# Patient Record
Sex: Female | Born: 1980 | Race: White | Hispanic: No | Marital: Single | Smoking: Never smoker
Health system: Southern US, Community
[De-identification: ages and names within clinical notes are randomized; demographics above are authoritative.]

## PROBLEM LIST (undated history)

## (undated) DIAGNOSIS — J45909 Unspecified asthma, uncomplicated: Secondary | ICD-10-CM

## (undated) DIAGNOSIS — L509 Urticaria, unspecified: Secondary | ICD-10-CM

## (undated) DIAGNOSIS — L0390 Cellulitis, unspecified: Secondary | ICD-10-CM

## (undated) HISTORY — DX: Urticaria, unspecified: L50.9

## (undated) HISTORY — PX: EYE SURGERY: SHX253

## (undated) HISTORY — PX: TUBAL LIGATION: SHX77

---

## 2010-10-16 ENCOUNTER — Emergency Department (HOSPITAL_COMMUNITY)
Admission: EM | Admit: 2010-10-16 | Discharge: 2010-10-17 | Payer: Self-pay | Source: Home / Self Care | Admitting: Emergency Medicine

## 2011-01-14 LAB — URINALYSIS, ROUTINE W REFLEX MICROSCOPIC
Bilirubin Urine: NEGATIVE
Glucose, UA: NEGATIVE mg/dL
Hgb urine dipstick: NEGATIVE
Nitrite: NEGATIVE
Protein, ur: NEGATIVE mg/dL
Specific Gravity, Urine: 1.018 (ref 1.005–1.030)
Urobilinogen, UA: 0.2 mg/dL (ref 0.0–1.0)
pH: 6 (ref 5.0–8.0)

## 2011-01-14 LAB — POCT I-STAT, CHEM 8
BUN: 15 mg/dL (ref 6–23)
HCT: 38 % (ref 36.0–46.0)
Potassium: 3.7 mEq/L (ref 3.5–5.1)
Sodium: 140 mEq/L (ref 135–145)
TCO2: 24 mmol/L (ref 0–100)

## 2011-01-14 LAB — RAPID URINE DRUG SCREEN, HOSP PERFORMED
Opiates: NOT DETECTED
Tetrahydrocannabinol: NOT DETECTED

## 2011-01-14 LAB — URINE MICROSCOPIC-ADD ON

## 2011-02-04 ENCOUNTER — Emergency Department (HOSPITAL_COMMUNITY)
Admission: EM | Admit: 2011-02-04 | Discharge: 2011-02-04 | Disposition: A | Discharge status: Home / Self Care | Payer: Self-pay | Attending: Emergency Medicine | Admitting: Emergency Medicine

## 2011-02-04 DIAGNOSIS — L0231 Cutaneous abscess of buttock: Secondary | ICD-10-CM | POA: Insufficient documentation

## 2011-02-04 DIAGNOSIS — L03317 Cellulitis of buttock: Secondary | ICD-10-CM | POA: Insufficient documentation

## 2011-03-22 ENCOUNTER — Emergency Department (HOSPITAL_COMMUNITY)
Admission: EM | Admit: 2011-03-22 | Discharge: 2011-03-22 | Disposition: A | Discharge status: Home / Self Care | Payer: Self-pay | Attending: Emergency Medicine | Admitting: Emergency Medicine

## 2011-03-22 DIAGNOSIS — L03317 Cellulitis of buttock: Secondary | ICD-10-CM | POA: Insufficient documentation

## 2011-03-22 DIAGNOSIS — L0231 Cutaneous abscess of buttock: Secondary | ICD-10-CM | POA: Insufficient documentation

## 2011-03-24 ENCOUNTER — Emergency Department (HOSPITAL_COMMUNITY)
Admission: EM | Admit: 2011-03-24 | Discharge: 2011-03-25 | Disposition: A | Discharge status: Home / Self Care | Payer: Self-pay | Attending: Emergency Medicine | Admitting: Emergency Medicine

## 2011-03-24 DIAGNOSIS — L0231 Cutaneous abscess of buttock: Secondary | ICD-10-CM | POA: Insufficient documentation

## 2011-03-24 DIAGNOSIS — Z09 Encounter for follow-up examination after completed treatment for conditions other than malignant neoplasm: Secondary | ICD-10-CM | POA: Insufficient documentation

## 2011-05-15 ENCOUNTER — Emergency Department (HOSPITAL_COMMUNITY)
Admission: EM | Admit: 2011-05-15 | Discharge: 2011-05-15 | Disposition: A | Discharge status: Home / Self Care | Payer: Self-pay | Attending: Emergency Medicine | Admitting: Emergency Medicine

## 2011-05-15 DIAGNOSIS — F319 Bipolar disorder, unspecified: Secondary | ICD-10-CM | POA: Insufficient documentation

## 2011-05-15 DIAGNOSIS — L03119 Cellulitis of unspecified part of limb: Secondary | ICD-10-CM | POA: Insufficient documentation

## 2011-05-15 DIAGNOSIS — L02419 Cutaneous abscess of limb, unspecified: Secondary | ICD-10-CM | POA: Insufficient documentation

## 2011-09-07 ENCOUNTER — Emergency Department (HOSPITAL_COMMUNITY)
Admission: EM | Admit: 2011-09-07 | Discharge: 2011-09-08 | Disposition: A | Discharge status: Home / Self Care | Payer: Self-pay | Attending: Emergency Medicine | Admitting: Emergency Medicine

## 2011-09-07 ENCOUNTER — Encounter: Payer: Self-pay | Admitting: *Deleted

## 2011-09-07 ENCOUNTER — Emergency Department (HOSPITAL_COMMUNITY)
Admission: EM | Admit: 2011-09-07 | Discharge: 2011-09-07 | Discharge status: Against Medical Advice | Payer: Self-pay | Attending: Emergency Medicine | Admitting: Emergency Medicine

## 2011-09-07 DIAGNOSIS — L03119 Cellulitis of unspecified part of limb: Secondary | ICD-10-CM | POA: Insufficient documentation

## 2011-09-07 DIAGNOSIS — L0291 Cutaneous abscess, unspecified: Secondary | ICD-10-CM

## 2011-09-07 DIAGNOSIS — L02416 Cutaneous abscess of left lower limb: Secondary | ICD-10-CM

## 2011-09-07 DIAGNOSIS — L02419 Cutaneous abscess of limb, unspecified: Secondary | ICD-10-CM | POA: Insufficient documentation

## 2011-09-07 DIAGNOSIS — Z0389 Encounter for observation for other suspected diseases and conditions ruled out: Secondary | ICD-10-CM | POA: Insufficient documentation

## 2011-09-07 HISTORY — DX: Cellulitis, unspecified: L03.90

## 2011-09-07 NOTE — ED Notes (Signed)
Resting with no pain or discomfort at this time , respirations unlabored, no drainage at abscess / slight peripheral reddness.

## 2011-09-07 NOTE — ED Notes (Signed)
abcess to inner L calf x 1 week. Reddened area with scab in center. Friend tried to open and drain it for the pt but now there is no drainage noted

## 2011-09-07 NOTE — ED Notes (Signed)
Here for L medial calf abcess, has been seen at [facility] for the same, abx not working. 1st noticed this abcess 4-7d ago, h/o same. ~4x4cm circular patch of redness with scab center. Repots some drainage after squeezing. Pt has pictures. Unsure of fever, but mentions chills.  Denies vd or cramping.

## 2011-09-07 NOTE — ED Provider Notes (Signed)
Patient left AMA prior to evaluation by the physician  Dayton Bailiff, MD 09/07/11 1601

## 2011-09-08 MED ORDER — HYDROCODONE-ACETAMINOPHEN 5-325 MG PO TABS: 1.0000 | ORAL_TABLET | Freq: Once | ORAL | Status: DC

## 2011-09-08 MED ORDER — DOXYCYCLINE HYCLATE 100 MG PO CAPS: 100.0000 mg | ORAL_CAPSULE | Freq: Two times a day (BID) | ORAL | Status: AC

## 2011-09-08 MED ORDER — DOXYCYCLINE HYCLATE 100 MG PO CAPS: 100.0000 mg | ORAL_CAPSULE | Freq: Two times a day (BID) | ORAL | Status: DC

## 2011-09-08 MED ORDER — HYDROCODONE-ACETAMINOPHEN 5-325 MG PO TABS
2.0000 | ORAL_TABLET | Freq: Once | ORAL | Status: AC
  Administered 2011-09-08: 2 via ORAL
  Filled 2011-09-08 (×2): qty 1

## 2011-09-08 NOTE — ED Provider Notes (Signed)
Medical screening examination/treatment/procedure(s) were performed by non-physician practitioner and as supervising physician I was immediately available for consultation/collaboration.  Olivia Mackie, MD 09/08/11 [phone]

## 2011-09-08 NOTE — ED Provider Notes (Signed)
History     CSN: [id number] Arrival date & time: 09/07/2011  8:51 PM   None     Chief Complaint  Patient presents with  . Abscess    (Consider location/radiation/quality/duration/timing/severity/associated sxs/prior treatment) HPI Comments: Patient presents with several day history of left lower leg abscess - no drainage - mild erythema surrounding the area.   Patient is a 30 y.o. female presenting with abscess. The history is provided by the patient. No language interpreter was used.  Abscess  This is a recurrent problem. The current episode started yesterday. The onset was gradual. The problem occurs frequently. The problem has been unchanged. The abscess is present on the left lower leg. The problem is moderate. The abscess is characterized by redness and painfulness. The patient was exposed to OTC medications. The abscess first occurred at home. Pertinent negatives include no anorexia, not sleeping less, no fever, no diarrhea, no vomiting, no sore throat and no cough. Her past medical history is significant for skin abscesses in family. She has received no recent medical care.    Past Medical History  Diagnosis Date  . Cellulitis     cellulitis/abcess x15, "every 2 months"    Past Surgical History  Procedure Date  . Eye surgery   . Cesarean section   . Tubal ligation     History reviewed. No pertinent family history.  History  Substance Use Topics  . Smoking status: Never Smoker   . Smokeless tobacco: Not on file  . Alcohol Use: No    OB History    Grav Para Term Preterm Abortions TAB SAB Ect Mult Living                  Review of Systems  Constitutional: Negative for fever and chills.  HENT: Negative.  Negative for sore throat.   Eyes: Negative.   Respiratory: Negative.  Negative for cough.   Cardiovascular: Negative.   Gastrointestinal: Negative.  Negative for vomiting, diarrhea and anorexia.  Genitourinary: Negative.   Musculoskeletal: Negative.     Neurological: Negative.   Hematological: Negative.   Psychiatric/Behavioral: Negative.     Allergies  Penicillins and Sulfa antibiotics  Home Medications  No current outpatient prescriptions on file.  BP 135/68  Pulse 100  Temp(Src) 98.7 F (37.1 C) (Oral)  Resp 18  SpO2 100%  LMP 08/29/2011  Physical Exam  Constitutional: She is oriented to person, place, and time. She appears well-developed and well-nourished.  HENT:  Head: Normocephalic and atraumatic.  Eyes: Pupils are equal, round, and reactive to light.  Neck: Normal range of motion. Neck supple.  Cardiovascular: Normal rate and regular rhythm.   Pulmonary/Chest: Effort normal and breath sounds normal.  Abdominal: Soft.  Musculoskeletal: Normal range of motion.  Neurological: She is alert and oriented to person, place, and time.  Skin: Skin is warm and dry.     Psychiatric: She has a normal mood and affect. Her behavior is normal. Judgment and thought content normal.    ED Course  INCISION AND DRAINAGE Date/Time: 09/08/2011 1:32 AM Performed by: Marisue Humble, Alexiah Koroma C. Authorized by: Patrecia Pour Consent: Verbal consent obtained. Written consent not obtained. Risks and benefits: risks, benefits and alternatives were discussed Consent given by: patient Patient understanding: patient states understanding of the procedure being performed Patient consent: the patient's understanding of the procedure matches consent given Relevant documents: relevant documents present and verified Test results: test results not available Site marked: the operative site was marked Patient identity confirmed: verbally  with patient and arm band Time out: Immediately prior to procedure a "time out" was called to verify the correct patient, procedure, equipment, support staff and site/side marked as required. Type: abscess Body area: lower extremity Location details: left leg Anesthesia: local infiltration Local anesthetic:  lidocaine 2% without epinephrine Anesthetic total: 3 ml Patient sedated: no Scalpel size: 11 Incision type: elliptical Complexity: complex Drainage: purulent Drainage amount: moderate Wound treatment: wound left open Packing material: 1/4 in iodoform gauze Patient tolerance: Patient tolerated the procedure well with no immediate complications.   (including critical care time)  Labs Reviewed - No data to display No results found.   Abscess of left lower leg   MDM  I&D of complex abscess - will place on doxycycline and pain control and the patient will return in 2 days for reassessment.        Izola Price [place], [place] 09/08/11 [phone]

## 2012-02-27 ENCOUNTER — Emergency Department (HOSPITAL_COMMUNITY)
Admission: EM | Admit: 2012-02-27 | Discharge: 2012-02-28 | Disposition: A | Discharge status: Home / Self Care | Payer: Self-pay | Attending: Emergency Medicine | Admitting: Emergency Medicine

## 2012-02-27 ENCOUNTER — Encounter (HOSPITAL_COMMUNITY): Payer: Self-pay | Admitting: *Deleted

## 2012-02-27 DIAGNOSIS — M549 Dorsalgia, unspecified: Secondary | ICD-10-CM | POA: Insufficient documentation

## 2012-02-27 DIAGNOSIS — R350 Frequency of micturition: Secondary | ICD-10-CM | POA: Insufficient documentation

## 2012-02-27 DIAGNOSIS — R300 Dysuria: Secondary | ICD-10-CM | POA: Insufficient documentation

## 2012-02-27 DIAGNOSIS — R10819 Abdominal tenderness, unspecified site: Secondary | ICD-10-CM | POA: Insufficient documentation

## 2012-02-27 DIAGNOSIS — R109 Unspecified abdominal pain: Secondary | ICD-10-CM | POA: Insufficient documentation

## 2012-02-27 DIAGNOSIS — N3090 Cystitis, unspecified without hematuria: Secondary | ICD-10-CM | POA: Insufficient documentation

## 2012-02-27 LAB — URINALYSIS, ROUTINE W REFLEX MICROSCOPIC
Bilirubin Urine: NEGATIVE
Glucose, UA: NEGATIVE mg/dL
Ketones, ur: NEGATIVE mg/dL
Nitrite: NEGATIVE
Protein, ur: 100 mg/dL — AB
Specific Gravity, Urine: 1.009 (ref 1.005–1.030)
Urobilinogen, UA: 0.2 mg/dL (ref 0.0–1.0)
pH: 6 (ref 5.0–8.0)

## 2012-02-27 LAB — URINE MICROSCOPIC-ADD ON

## 2012-02-27 LAB — PREGNANCY, URINE: Preg Test, Ur: NEGATIVE

## 2012-02-27 NOTE — ED Notes (Signed)
The pt says she has a uti and she has had lower back pain for one wee.  lmp march 1st

## 2012-02-28 MED ORDER — CIPROFLOXACIN HCL 500 MG PO TABS: 500.0000 mg | ORAL_TABLET | Freq: Two times a day (BID) | ORAL | Status: AC

## 2012-02-28 NOTE — ED Notes (Signed)
Rx x 1, pt voiced understanding to f/u with PCP and finish abx rx

## 2012-02-28 NOTE — ED Provider Notes (Signed)
Medical screening examination/treatment/procedure(s) were performed by non-physician practitioner and as supervising physician I was immediately available for consultation/collaboration.  Daje Stark K Xachary Hambly-Rasch, MD 02/28/12 0640 

## 2012-02-28 NOTE — Discharge Instructions (Signed)
Your urine shows signs for a urinary tract infection. At this time your providers have provided a prescription for antibiotics to treat your infection. Please take this for the full 10 days and followup with a primary care provider. Return to the emergency room for any worsening symptoms, fever, chills, persistent nausea vomiting.   Urinary Tract Infection Infections of the urinary tract can start in several places. A bladder infection (cystitis), a kidney infection (pyelonephritis), and a prostate infection (prostatitis) are different types of urinary tract infections (UTIs). They usually get better if treated with medicines (antibiotics) that kill germs. Take all the medicine until it is gone. You or your child may feel better in a few days, but TAKE ALL MEDICINE or the infection may not respond and may become more difficult to treat. HOME CARE INSTRUCTIONS   Drink enough water and fluids to keep the urine clear or pale yellow. Cranberry juice is especially recommended, in addition to large amounts of water.   Avoid caffeine, tea, and carbonated beverages. They tend to irritate the bladder.   Alcohol may irritate the prostate.   Only take over-the-counter or prescription medicines for pain, discomfort, or fever as directed by your caregiver.  To prevent further infections:  Empty the bladder often. Avoid holding urine for long periods of time.   After a bowel movement, women should cleanse from front to back. Use each tissue only once.   Empty the bladder before and after sexual intercourse.  FINDING OUT THE RESULTS OF YOUR TEST Not all test results are available during your visit. If your or your child's test results are not back during the visit, make an appointment with your caregiver to find out the results. Do not assume everything is normal if you have not heard from your caregiver or the medical facility. It is important for you to follow up on all test results. SEEK MEDICAL CARE IF:     There is back pain.   Your baby is older than 3 months with a rectal temperature of 100.5 F (38.1 C) or higher for more than 1 day.   Your or your child's problems (symptoms) are no better in 3 days. Return sooner if you or your child is getting worse.  SEEK IMMEDIATE MEDICAL CARE IF:   There is severe back pain or lower abdominal pain.   You or your child develops chills.   You have a fever.   Your baby is older than 3 months with a rectal temperature of 102 F (38.9 C) or higher.   Your baby is 33 months old or younger with a rectal temperature of 100.4 F (38 C) or higher.   There is nausea or vomiting.   There is continued burning or discomfort with urination.  MAKE SURE YOU:   Understand these instructions.   Will watch your condition.   Will get help right away if you are not doing well or get worse.  Document Released: 07/30/2005 Document Revised: 10/09/2011 Document Reviewed: 03/04/2007 [facility] Patient Information 2012 [place], [place].  RESOURCE GUIDE  Dental Problems  Patients with Medicaid: [facility] [phone] [address]  [address] Phone:  [phone]                                                  Phone:  [phone]  If unable to pay or uninsured, contact:  [facility] or [facility]. to become qualified for the adult dental clinic.  Chronic Pain Problems Contact [facility]  [phone] Patients need to be referred by their primary care doctor.  Insufficient Money for Medicine Contact United Way:  call "211" or [facility] [phone])[phone].  No Primary Care Doctor Call [facility]  [phone] Other agencies that provide inexpensive medical care    [facility]  [phone]    [facility]  [phone]    [facility]  [phone]    [facility]  [phone]    Planned  Parenthood  [phone]    [facility]  [phone]  Psychological Services [facility] Behavioral Health  [phone] [facility] Services  [phone] [facility] Mental Health   [phone] (emergency services [phone])  Substance Abuse Resources [facility]  [phone] [facility] [phone] [facility] [phone] Floyd[facility] Flock [phone] Residential & Outpatient Substance Abuse Program  [phone]  Abuse/Neglect [facility] Child Abuse Hotline [phone] [facility] Child Abuse Hotline [phone] (After Hours)  Emergency Shelter [facility] [phone]  Maternity Homes [facility] [place] [facility] [phone] [facility] [phone]  MRSA Hotline #:   (640)754-1332    [facility] Resources  [facility]     [employer]                          [facility] Dept. [address] [place]                       [address]      [address]  Blondell Reveal Phone:  [phone]                                   Phone:  [phone]                 Phone:  [phone]  [facility] Mental Health Phone:  [phone]  [facility] Child Abuse Hotline [phone] [phone] (After Hours)

## 2012-02-28 NOTE — ED Provider Notes (Signed)
History     CSN: [id number]  Arrival date & time 02/27/12  2136   First MD Initiated Contact with Patient 02/27/12 2359      Chief Complaint  Patient presents with  . Back Pain   HPI  History provided by the patient. Patient is a 31 year old female with history of cesarean section presents with complaints of urinary tract infection. Patient reports having symptoms of dysuria, urinary frequency began last week. Patient reports history of UTIs until symptoms are similar. She was taking AZO from the pharmacy hopeing to improve symptoms without change. This morning patient reports that she woke up with bilateral low back pain and aches. Patient is concerned for worsening infection. She denies any fever, chills, sweats, nausea or vomiting. She denies any hematuria. She denies any vaginal bleeding or vaginal discharge.    Past Medical History  Diagnosis Date  . Cellulitis     cellulitis/abcess x15, "every 2 months"    Past Surgical History  Procedure Date  . Eye surgery   . Cesarean section   . Tubal ligation     No family history on file.  History  Substance Use Topics  . Smoking status: Never Smoker   . Smokeless tobacco: Not on file  . Alcohol Use: No    OB History    Grav Para Term Preterm Abortions TAB SAB Ect Mult Living                  Review of Systems  Constitutional: Negative for fever and chills.  Gastrointestinal: Negative for nausea, vomiting, abdominal pain, diarrhea and constipation.  Genitourinary: Positive for dysuria, frequency and flank pain. Negative for hematuria, vaginal bleeding and vaginal discharge.  Musculoskeletal: Positive for back pain.    Allergies  Penicillins and Sulfa antibiotics  Home Medications  No current outpatient prescriptions on file.  BP 128/87  Pulse 100  Temp(Src) 98.7 F (37.1 C) (Oral)  Resp 18  SpO2 99%  LMP 01/27/2012  Physical Exam  Nursing note and vitals reviewed. Constitutional: She is oriented to  person, place, and time. She appears well-developed and well-nourished. No distress.  HENT:  Head: Normocephalic and atraumatic.  Cardiovascular: Normal rate and regular rhythm.   Pulmonary/Chest: Effort normal and breath sounds normal. No respiratory distress. She has no wheezes. She has no rales.  Abdominal: Soft. There is tenderness in the suprapubic area. There is no rigidity, no rebound, no guarding, no CVA tenderness and no tenderness at McBurney's point.       No significant CVA tenderness.  Neurological: She is alert and oriented to person, place, and time.  Skin: Skin is warm and dry. No rash noted.  Psychiatric: She has a normal mood and affect. Her behavior is normal.    ED Course  Procedures   Results for orders placed during the hospital encounter of 02/27/12  URINALYSIS, ROUTINE W REFLEX MICROSCOPIC      Component Value Range   Color, Urine YELLOW  YELLOW    APPearance CLOUDY (*) CLEAR    Specific Gravity, Urine 1.009  1.005 - 1.030    pH 6.0  5.0 - 8.0    Glucose, UA NEGATIVE  NEGATIVE (mg/dL)   Hgb urine dipstick LARGE (*) NEGATIVE    Bilirubin Urine NEGATIVE  NEGATIVE    Ketones, ur NEGATIVE  NEGATIVE (mg/dL)   Protein, ur 409 (*) NEGATIVE (mg/dL)   Urobilinogen, UA 0.2  0.0 - 1.0 (mg/dL)   Nitrite NEGATIVE  NEGATIVE    Leukocytes,  UA LARGE (*) NEGATIVE   PREGNANCY, URINE      Component Value Range   Preg Test, Ur NEGATIVE  NEGATIVE   URINE MICROSCOPIC-ADD ON      Component Value Range   Squamous Epithelial / LPF RARE  RARE    WBC, UA 21-50  <3 (WBC/hpf)   RBC / HPF 21-50  <3 (RBC/hpf)   Bacteria, UA RARE  RARE    Urine-Other MUCOUS PRESENT         1. Cystitis       MDM  12:20 AM patient seen and evaluated. Patient no acute distress. Patient appears well and nontoxic.        Angus Seller, [place] 02/28/12 [phone]

## 2012-07-12 ENCOUNTER — Emergency Department (HOSPITAL_COMMUNITY)
Admission: EM | Admit: 2012-07-12 | Discharge: 2012-07-12 | Disposition: A | Discharge status: Home / Self Care | Payer: Self-pay | Attending: Emergency Medicine | Admitting: Emergency Medicine

## 2012-07-12 ENCOUNTER — Encounter (HOSPITAL_COMMUNITY): Payer: Self-pay | Admitting: Family Medicine

## 2012-07-12 DIAGNOSIS — Z882 Allergy status to sulfonamides status: Secondary | ICD-10-CM | POA: Insufficient documentation

## 2012-07-12 DIAGNOSIS — Z880 Allergy status to penicillin: Secondary | ICD-10-CM | POA: Insufficient documentation

## 2012-07-12 DIAGNOSIS — L089 Local infection of the skin and subcutaneous tissue, unspecified: Secondary | ICD-10-CM | POA: Insufficient documentation

## 2012-07-12 NOTE — ED Notes (Signed)
Midwife given. Pt must find a PCP
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
## 2012-07-12 NOTE — ED Provider Notes (Signed)
History     CSN: [id number]  Arrival date & time 07/12/12  1425   First MD Initiated Contact with Patient 07/12/12 [phone]      Chief Complaint  Patient presents with  . Insect Bite    (Consider location/radiation/quality/duration/timing/severity/associated sxs/prior treatment) HPI Comments: 31 year old female present with "a pimple thing" on the inner side of her right thigh. She states she noticed this this morning. She thinks she got bit by something. She did not see anything bite her. Denies any itching or pain. States she has an occasional burning sensation if she touches it. Denies any fever, chills. Denies shaving in that area. She has not tried to do anything to make the pimple go away.   The history is provided by the patient.    Past Medical History  Diagnosis Date  . Cellulitis     cellulitis/abcess x15, "every 2 months"    Past Surgical History  Procedure Date  . Eye surgery   . Cesarean section   . Tubal ligation     History reviewed. No pertinent family history.  History  Substance Use Topics  . Smoking status: Never Smoker   . Smokeless tobacco: Not on file  . Alcohol Use: No    OB History    Grav Para Term Preterm Abortions TAB SAB Ect Mult Living                  Review of Systems  Constitutional: Negative for fever and chills.  Respiratory: Negative for shortness of breath.   Cardiovascular: Negative for chest pain.  Skin:       "pimple" on right thigh    Allergies  Penicillins and Sulfa antibiotics  Home Medications  No current outpatient prescriptions on file.  BP 123/82  Pulse 81  Temp 98.1 F (36.7 C) (Oral)  Resp 16  SpO2 100%  LMP 06/21/2012  Physical Exam  Nursing note and vitals reviewed. Constitutional: She is oriented to person, place, and time. She appears well-developed and well-nourished. No distress.  HENT:  Head: Normocephalic and atraumatic.  Mouth/Throat: Oropharynx is clear and moist.  Eyes: Conjunctivae are  normal.  Neck: Normal range of motion. Neck supple.  Cardiovascular: Normal rate, regular rhythm and normal heart sounds.   Pulmonary/Chest: Effort normal and breath sounds normal.  Musculoskeletal: Normal range of motion. She exhibits no edema.  Neurological: She is alert and oriented to person, place, and time.  Skin: Skin is warm and dry. She is not diaphoretic.       1 mm pustular lesion present on inner aspect of right thigh. No surrounding erythema or edema. No flucctuance.  Psychiatric: She has a normal mood and affect. Her speech is delayed. She is slowed.    ED Course  Procedures (including critical care time)  Labs Reviewed - No data to display No results found.   1. Skin pustule       MDM  31 year old with pustule in the inner side of right eye. No surrounding cellulitis and no fluctuance. Pustule was poked with a 21-gauge needle. No drainage from pustule. No evidence of secondary infection. Advised patient to return to emergency Department if she notices the pustule get better, or if she develops fever or chills.       Trevor Mace, PA-C 07/12/12 1640

## 2012-07-13 NOTE — ED Provider Notes (Signed)
Medical screening examination/treatment/procedure(s) were performed by non-physician practitioner and as supervising physician I was immediately available for consultation/collaboration.   Lamyra Malcolm, MD 07/13/12 1347 

## 2013-02-01 ENCOUNTER — Emergency Department (HOSPITAL_COMMUNITY)
Admission: EM | Admit: 2013-02-01 | Discharge: 2013-02-02 | Disposition: A | Discharge status: Home / Self Care | Payer: Self-pay | Attending: Emergency Medicine | Admitting: Emergency Medicine

## 2013-02-01 ENCOUNTER — Emergency Department (HOSPITAL_COMMUNITY): Payer: Self-pay

## 2013-02-01 ENCOUNTER — Encounter (HOSPITAL_COMMUNITY): Payer: Self-pay | Admitting: Family Medicine

## 2013-02-01 DIAGNOSIS — Y9372 Activity, wrestling: Secondary | ICD-10-CM | POA: Insufficient documentation

## 2013-02-01 DIAGNOSIS — S6991XA Unspecified injury of right wrist, hand and finger(s), initial encounter: Secondary | ICD-10-CM

## 2013-02-01 DIAGNOSIS — S59909A Unspecified injury of unspecified elbow, initial encounter: Secondary | ICD-10-CM | POA: Insufficient documentation

## 2013-02-01 DIAGNOSIS — R296 Repeated falls: Secondary | ICD-10-CM | POA: Insufficient documentation

## 2013-02-01 DIAGNOSIS — Y9239 Other specified sports and athletic area as the place of occurrence of the external cause: Secondary | ICD-10-CM | POA: Insufficient documentation

## 2013-02-01 DIAGNOSIS — S6990XA Unspecified injury of unspecified wrist, hand and finger(s), initial encounter: Secondary | ICD-10-CM | POA: Insufficient documentation

## 2013-02-01 DIAGNOSIS — J45909 Unspecified asthma, uncomplicated: Secondary | ICD-10-CM | POA: Insufficient documentation

## 2013-02-01 HISTORY — DX: Unspecified asthma, uncomplicated: J45.909

## 2013-02-01 NOTE — ED Notes (Signed)
Pt just returned from [place]. Pt ambulatory to exam room with steady gait.

## 2013-02-01 NOTE — ED Notes (Signed)
Patient states that she was playing around and fell onto her right wrist last night. States wrist started swelling last night and today has pain and difficulty moving. Edema and noted to right forearm.

## 2013-02-02 ENCOUNTER — Emergency Department (HOSPITAL_COMMUNITY): Payer: Self-pay

## 2013-02-02 MED ORDER — HYDROCODONE-ACETAMINOPHEN 5-325 MG PO TABS: 1.0000 | ORAL_TABLET | ORAL | Status: DC | PRN
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
## 2013-02-02 NOTE — ED Provider Notes (Signed)
Medical screening examination/treatment/procedure(s) were performed by non-physician practitioner and as supervising physician I was immediately available for consultation/collaboration.  Annamay Laymon T Baden Betsch, MD 02/02/13 1707 

## 2015-07-26 ENCOUNTER — Emergency Department
Admission: EM | Admit: 2015-07-26 | Discharge: 2015-07-27 | Disposition: A | Discharge status: Home / Self Care | Payer: Self-pay | Attending: Emergency Medicine | Admitting: Emergency Medicine

## 2015-07-26 ENCOUNTER — Emergency Department: Payer: Self-pay

## 2015-07-26 ENCOUNTER — Encounter: Payer: Self-pay | Admitting: *Deleted

## 2015-07-26 DIAGNOSIS — Z880 Allergy status to penicillin: Secondary | ICD-10-CM | POA: Insufficient documentation

## 2015-07-26 DIAGNOSIS — Y9289 Other specified places as the place of occurrence of the external cause: Secondary | ICD-10-CM | POA: Insufficient documentation

## 2015-07-26 DIAGNOSIS — T148XXA Other injury of unspecified body region, initial encounter: Secondary | ICD-10-CM

## 2015-07-26 DIAGNOSIS — S46912A Strain of unspecified muscle, fascia and tendon at shoulder and upper arm level, left arm, initial encounter: Secondary | ICD-10-CM | POA: Insufficient documentation

## 2015-07-26 DIAGNOSIS — S40812A Abrasion of left upper arm, initial encounter: Secondary | ICD-10-CM | POA: Insufficient documentation

## 2015-07-26 DIAGNOSIS — S161XXA Strain of muscle, fascia and tendon at neck level, initial encounter: Secondary | ICD-10-CM | POA: Insufficient documentation

## 2015-07-26 DIAGNOSIS — T148 Other injury of unspecified body region: Secondary | ICD-10-CM | POA: Insufficient documentation

## 2015-07-26 DIAGNOSIS — Y998 Other external cause status: Secondary | ICD-10-CM | POA: Insufficient documentation

## 2015-07-26 DIAGNOSIS — Y9389 Activity, other specified: Secondary | ICD-10-CM | POA: Insufficient documentation

## 2015-07-26 MED ORDER — IBUPROFEN 800 MG PO TABS
800.0000 mg | ORAL_TABLET | Freq: Once | ORAL | Status: AC
  Administered 2015-07-26: 800 mg via ORAL
  Filled 2015-07-26: qty 1

## 2015-07-26 MED ORDER — IBUPROFEN 800 MG PO TABS: 800.0000 mg | ORAL_TABLET | Freq: Three times a day (TID) | ORAL | Status: DC | PRN

## 2015-07-26 MED ORDER — CYCLOBENZAPRINE HCL 10 MG PO TABS: 10.0000 mg | ORAL_TABLET | Freq: Three times a day (TID) | ORAL | Status: DC | PRN
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
## 2015-07-26 NOTE — Discharge Instructions (Signed)
Cervical Sprain A cervical sprain is when the tissues (ligaments) that hold the neck bones in place stretch or tear. HOME CARE   Put ice on the injured area.  Put ice in a plastic bag.  Place a towel between your skin and the bag.  Leave the ice on for 15-20 minutes, 3-4 times a day.  You may have been given a collar to wear. This collar keeps your neck from moving while you heal.  Do not take the collar off unless told by your doctor.  If you have long hair, keep it outside of the collar.  Ask your doctor before changing the position of your collar. You may need to change its position over time to make it more comfortable.  If you are allowed to take off the collar for cleaning or bathing, follow your doctor's instructions on how to do it safely.  Keep your collar clean by wiping it with mild soap and water. Dry it completely. If the collar has removable pads, remove them every 1-2 days to hand wash them with soap and water. Allow them to air dry. They should be dry before you wear them in the collar.  Do not drive while wearing the collar.  Only take medicine as told by your doctor.  Keep all doctor visits as told.  Keep all physical therapy visits as told.  Adjust your work station so that you have good posture while you work.  Avoid positions and activities that make your problems worse.  Warm up and stretch before being active. GET HELP IF:  Your pain is not controlled with medicine.  You cannot take less pain medicine over time as planned.  Your activity level does not improve as expected. GET HELP RIGHT AWAY IF:   You are bleeding.  Your stomach is upset.  You have an allergic reaction to your medicine.  You develop new problems that you cannot explain.  You lose feeling (become numb) or you cannot move any part of your body (paralysis).  You have tingling or weakness in any part of your body.  Your symptoms get worse. Symptoms include:  Pain,  soreness, stiffness, puffiness (swelling), or a burning feeling in your neck.  Pain when your neck is touched.  Shoulder or upper back pain.  Limited ability to move your neck.  Headache.  Dizziness.  Your hands or arms feel week, lose feeling, or tingle.  Muscle spasms.  Difficulty swallowing or chewing. MAKE SURE YOU:   Understand these instructions.  Will watch your condition.  Will get help right away if you are not doing well or get worse. Document Released: 04/07/2008 Document Revised: 06/22/2013 Document Reviewed: 04/27/2013 [facility] Patient Information 2015 [place], [place]. This information is not intended to replace advice given to you by your health care provider. Make sure you discuss any questions you have with your health care provider.  Contusion A contusion is a deep bruise. Contusions are the result of an injury that caused bleeding under the skin. The contusion may turn blue, purple, or yellow. Minor injuries will give you a painless contusion, but more severe contusions may stay painful and swollen for a few weeks.  CAUSES  A contusion is usually caused by a blow, trauma, or direct force to an area of the body. SYMPTOMS   Swelling and redness of the injured area.  Bruising of the injured area.  Tenderness and soreness of the injured area.  Pain. DIAGNOSIS  The diagnosis can be made by  taking a history and physical exam. An X-ray, CT scan, or MRI may be needed to determine if there were any associated injuries, such as fractures. TREATMENT  Specific treatment will depend on what area of the body was injured. In general, the best treatment for a contusion is resting, icing, elevating, and applying cold compresses to the injured area. Over-the-counter medicines may also be recommended for pain control. Ask your caregiver what the best treatment is for your contusion. HOME CARE INSTRUCTIONS   Put ice on the injured area.  Put ice in a plastic  bag.  Place a towel between your skin and the bag.  Leave the ice on for 15-20 minutes, 3-4 times a day, or as directed by your health care provider.  Only take over-the-counter or prescription medicines for pain, discomfort, or fever as directed by your caregiver. Your caregiver may recommend avoiding anti-inflammatory medicines (aspirin, ibuprofen, and naproxen) for 48 hours because these medicines may increase bruising.  Rest the injured area.  If possible, elevate the injured area to reduce swelling. SEEK IMMEDIATE MEDICAL CARE IF:   You have increased bruising or swelling.  You have pain that is getting worse.  Your swelling or pain is not relieved with medicines. MAKE SURE YOU:   Understand these instructions.  Will watch your condition.  Will get help right away if you are not doing well or get worse. Document Released: 07/30/2005 Document Revised: 10/25/2013 Document Reviewed: 08/25/2011 [facility] Patient Information 2015 [place], [place]. This information is not intended to replace advice given to you by your health care provider. Make sure you discuss any questions you have with your health care provider.  Shoulder Sprain A shoulder sprain is the result of damage to the tough, fiber-like tissues (ligaments) that help hold your shoulder in place. The ligaments may be stretched or torn. Besides the main shoulder joint (the ball and socket), there are several smaller joints that connect the bones in this area. A sprain usually involves one of those joints. Most often it is the acromioclavicular (or AC) joint. That is the joint that connects the collarbone (clavicle) and the shoulder blade (scapula) at the top point of the shoulder blade (acromion). A shoulder sprain is a mild form of what is called a shoulder separation. Recovering from a shoulder sprain may take some time. For some, pain lingers for several months. Most people recover without long term problems. CAUSES   A  shoulder sprain is usually caused by some kind of trauma. This might be:  Falling on an outstretched arm.  Being hit hard on the shoulder.  Twisting the arm.  Shoulder sprains are more likely to occur in people who:  Play sports.  Have balance or coordination problems. SYMPTOMS   Pain when you move your shoulder.  Limited ability to move the shoulder.  Swelling and tenderness on top of the shoulder.  Redness or warmth in the shoulder.  Bruising.  A change in the shape of the shoulder. DIAGNOSIS  Your healthcare provider may:  Ask about your symptoms.  Ask about recent activity that might have caused those symptoms.  Examine your shoulder. You may be asked to do simple exercises to test movement. The other shoulder will be examined for comparison.  Order some tests that provide a look inside the body. They can show the extent of the injury. The tests could include:  X-rays.  CT (computed tomography) scan.  MRI (magnetic resonance imaging) scan. RISKS AND COMPLICATIONS  Loss of full shoulder  motion.  Ongoing shoulder pain. TREATMENT  How long it takes to recover from a shoulder sprain depends on how severe it was. Treatment options may include:  Rest. You should not use the arm or shoulder until it heals.  Ice. For 2 or 3 days after the injury, put an ice pack on the shoulder up to 4 times a day. It should stay on for 15 to 20 minutes each time. Wrap the ice in a towel so it does not touch your skin.  Over-the-counter medicine to relieve pain.  A sling or brace. This will keep the arm still while the shoulder is healing.  Physical therapy or rehabilitation exercises. These will help you regain strength and motion. Ask your healthcare provider when it is OK to begin these exercises.  Surgery. The need for surgery is rare with a sprained shoulder, but some people may need surgery to keep the joint in place and reduce pain. HOME CARE INSTRUCTIONS   Ask your  healthcare provider about what you should and should not do while your shoulder heals.  Make sure you know how to apply ice to the correct area of your shoulder.  Talk with your healthcare provider about which medications should be used for pain and swelling.  If rehabilitation therapy will be needed, ask your healthcare provider to refer you to a therapist. If it is not recommended, then ask about at-home exercises. Find out when exercise should begin. SEEK MEDICAL CARE IF:  Your pain, swelling, or redness at the joint increases. SEEK IMMEDIATE MEDICAL CARE IF:   You have a fever.  You cannot move your arm or shoulder. Document Released: 03/08/2009 Document Revised: 01/12/2012 Document Reviewed: 03/08/2009 [facility] Patient Information 2015 [place], [place]. This information is not intended to replace advice given to you by your health care provider. Make sure you discuss any questions you have with your health care provider.  Take pain medicine as directed. Use sling as needed for rest and comfort. Follow-up with the orthopedist if not proving. Begin exercises of the shoulder as pain improves.
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
## 2017-06-22 ENCOUNTER — Encounter (HOSPITAL_COMMUNITY): Payer: Self-pay | Admitting: *Deleted

## 2017-06-22 ENCOUNTER — Emergency Department (HOSPITAL_COMMUNITY)
Admission: EM | Admit: 2017-06-22 | Discharge: 2017-06-22 | Disposition: A | Discharge status: Home / Self Care | Payer: BLUE CROSS/BLUE SHIELD | Attending: Physician Assistant | Admitting: Physician Assistant

## 2017-06-22 DIAGNOSIS — B353 Tinea pedis: Secondary | ICD-10-CM | POA: Insufficient documentation

## 2017-06-22 DIAGNOSIS — R21 Rash and other nonspecific skin eruption: Secondary | ICD-10-CM | POA: Diagnosis present

## 2017-06-22 DIAGNOSIS — J45909 Unspecified asthma, uncomplicated: Secondary | ICD-10-CM | POA: Insufficient documentation

## 2017-06-22 MED ORDER — CLOTRIMAZOLE 1 % EX CREA: TOPICAL_CREAM | CUTANEOUS | 0 refills | Status: DC

## 2017-06-22 NOTE — Discharge Instructions (Signed)
Alternate 600 mg of ibuprofen and [phone] mg of Tylenol every 3 hours as needed for pain. Do not exceed 4000 mg of Tylenol daily. Apply antifungal ointment to the area twice daily including in between your toes. Stop using peroxide and alcohol. Keep the area clean and dry.  Follow up with a primary care physician for reevaluation of your rash. Return to the ED immediately if any concerning signs or symptoms develop such as redness, fevers, worsening spread of rash or swelling occur.

## 2017-06-22 NOTE — ED Provider Notes (Signed)
[facility]-EMERGENCY DEPT Provider Note   CSN: [id number] Arrival date & time: 06/22/17  0800  By signing my name below, I, Diona Browner, attest that this documentation has been prepared under the direction and in the presence of [facility], PA-C. Electronically Signed: Diona Browner, ED Scribe. 06/22/17. 9:16 AM.  History   Chief Complaint Chief Complaint  Patient presents with  . Foot Pain    HPI Alison Vega is a 36 y.o. female who presents to the Emergency Department complaining of gradually worsening, right foot rash that started ~ 2 weeks ago. Pt has dry crackling skin on the bottom of her foot and between her toes. She reports wearing steel-toe boots at work. Pain is sharp/pins and needles sensation and worse in the morning, while walking, and it radiates up her foot and leg. While showering it becomes itchy and she will rub it with a rag. She has been using peroxide, alcohol and neosporin with no relief.Denies outdoor exposures. No new lotions, detergents, soaps. Pt denies fever, chills, swelling, bleeding, or any other complaints at this time.   The history is provided by the patient and a relative (Sister). No language interpreter was used.    Past Medical History:  Diagnosis Date  . Asthma   . Cellulitis    cellulitis/abcess x15, "every 2 months"    There are no active problems to display for this patient.   Past Surgical History:  Procedure Laterality Date  . CESAREAN SECTION    . EYE SURGERY    . TUBAL LIGATION      OB History    No data available       Home Medications    Prior to Admission medications   Medication Sig Start Date End Date Taking? Authorizing Provider  clotrimazole (LOTRIMIN) 1 % cream Apply to affected area 2 times daily 06/22/17   Luevenia Maxin, Sharlisa Hollifield A, PA-C  cyclobenzaprine (FLEXERIL) 10 MG tablet Take 1 tablet (10 mg total) by mouth every 8 (eight) hours as needed for muscle spasms. 07/26/15   Ignacia Bayley, PA-C  HYDROcodone-acetaminophen  (NORCO/VICODIN) 5-325 MG per tablet Take 1 tablet by mouth every 4 (four) hours as needed for pain. 02/02/13   Garlon Hatchet, PA-C  ibuprofen (ADVIL,MOTRIN) 800 MG tablet Take 1 tablet (800 mg total) by mouth every 8 (eight) hours as needed. 07/26/15   Ignacia Bayley, PA-C    Family History No family history on file.  Social History Social History  Substance Use Topics  . Smoking status: Never Smoker  . Smokeless tobacco: Never Used  . Alcohol use No     Allergies   Penicillins and Sulfa antibiotics   Review of Systems Review of Systems  Constitutional: Negative for chills and fever.  Musculoskeletal: Positive for arthralgias. Negative for joint swelling.  Skin: Positive for rash.     Physical Exam Updated Vital Signs BP 127/85 (BP Location: Left Arm)   Pulse 71   Temp 97.9 F (36.6 C) (Oral)   Resp 16   LMP 06/03/2017 (Approximate)   SpO2 100%   Physical Exam  Constitutional: She appears well-developed and well-nourished. No distress.  HENT:  Head: Normocephalic and atraumatic.  Eyes: Conjunctivae are normal. Right eye exhibits no discharge. Left eye exhibits no discharge.  Neck: No JVD present. No tracheal deviation present.  Cardiovascular: Normal rate and intact distal pulses.   2+ DP/PT pulses bilaterally  Pulmonary/Chest: Effort normal.  Abdominal: She exhibits no distension.  Musculoskeletal: Normal range of motion. She exhibits no edema,  tenderness or deformity.  Neurological: She is alert. No sensory deficit.  Skin: Skin is warm and dry. Rash noted. No erythema.  Plantar aspect of right foot with flaky rash and white macerated skin, especially between the toes. Nontender to palpation. There is an erythematous rim surrounding the lateral aspect of the foot with central clearing. No significant swelling, induration, or fluctuance. No pustules, vesicles.   Psychiatric: She has a normal mood and affect. Her behavior is normal.  Nursing note and vitals  reviewed.    ED Treatments / Results  DIAGNOSTIC STUDIES: Oxygen Saturation is 100% on RA, normal by my interpretation.   COORDINATION OF CARE: 9:16 AM-Discussed next steps with pt which includes using an antifungal foot cream. Pt verbalized understanding and is agreeable with the plan.   Labs (all labs ordered are listed, but only abnormal results are displayed) Labs Reviewed - No data to display  EKG  EKG Interpretation None       Radiology No results found.  Procedures Procedures (including critical care time)  Medications Ordered in ED Medications - No data to display   Initial Impression / Assessment and Plan / ED Course  I have reviewed the triage vital signs and the nursing notes.  Pertinent labs & imaging results that were available during my care of the patient were reviewed by me and considered in my medical decision making (see chart for details).     Rash consistent with tinea pedis. Patient denies any difficulty breathing or swallowing.  Pt has a patent airway without stridor and is handling secretions without difficulty; no angioedema. No blisters, no pustules, no warmth, no draining sinus tracts, no superficial abscesses, no bullous impetigo, no vesicles, no desquamation, no target lesions with dusky purpura or a central bulla. Not tender to touch. No concern for superimposed infection. No concern for SJS, TEN, TSS, tick borne illness, syphilis or other life-threatening condition. Will discharge home with topical Lotrimin and recommend Benadryl as needed for pruritis. Follow up with primary care if rash persists. Discussed indications for return to the ED. Pt verbalized understanding of and agreement with plan and is safe for discharge home at this time.  Final Clinical Impressions(s) / ED Diagnoses   Final diagnoses:  Tinea pedis of right foot    New Prescriptions New Prescriptions   CLOTRIMAZOLE (LOTRIMIN) 1 % CREAM    Apply to affected area 2 times  daily  I personally performed the services described in this documentation, which was scribed in my presence. The recorded information has been reviewed and is accurate.     Jeanie Sewer, PA-C 06/22/17 4403    Abelino Derrick, MD 06/24/17 1806

## 2017-06-22 NOTE — ED Triage Notes (Signed)
Pt here for dry, cracking skin on the bottom or R foot, pt reports wearing steel toe boots, pt c/o pain that radiates up her R leg, no redness, swelling or bleeding noted, A&O x4, pt ambulatory

## 2018-02-22 ENCOUNTER — Emergency Department (HOSPITAL_COMMUNITY)
Admission: EM | Admit: 2018-02-22 | Discharge: 2018-02-22 | Disposition: A | Discharge status: Home / Self Care | Payer: BLUE CROSS/BLUE SHIELD | Attending: Emergency Medicine | Admitting: Emergency Medicine

## 2018-02-22 ENCOUNTER — Encounter (HOSPITAL_COMMUNITY): Payer: Self-pay

## 2018-02-22 DIAGNOSIS — J029 Acute pharyngitis, unspecified: Secondary | ICD-10-CM | POA: Diagnosis present

## 2018-02-22 DIAGNOSIS — J45909 Unspecified asthma, uncomplicated: Secondary | ICD-10-CM | POA: Diagnosis not present

## 2018-02-22 DIAGNOSIS — Z79899 Other long term (current) drug therapy: Secondary | ICD-10-CM | POA: Insufficient documentation

## 2018-02-22 LAB — I-STAT BETA HCG BLOOD, ED (MC, WL, AP ONLY)

## 2018-02-22 LAB — GROUP A STREP BY PCR: GROUP A STREP BY PCR: NOT DETECTED

## 2018-02-22 MED ORDER — DEXAMETHASONE SODIUM PHOSPHATE 10 MG/ML IJ SOLN
10.0000 mg | Freq: Once | INTRAMUSCULAR | Status: AC
  Administered 2018-02-22: 10 mg via INTRAMUSCULAR
  Filled 2018-02-22: qty 1

## 2018-02-22 NOTE — ED Notes (Addendum)
Pt states her throat began hurting yesterday morning when she woke up and it is painful to swallow.  No fever or chills

## 2018-02-22 NOTE — ED Provider Notes (Signed)
[facility] EMERGENCY DEPARTMENT Provider Note   CSN: [id number] Arrival date & time: 02/22/18  0840     History   Chief Complaint Chief Complaint  Patient presents with  . Sore Throat    HPI Alison Vega is a 37 y.o. female who presents for evaluation of sore throat that began last night.  Patient reports that she does not take any medications for the pain.  She reports that pain is worsened by swallowing.  She has been able to tolerate her secretions, p.o., liquids.  She reports worsening pain with attempting to eat solid food.  Patient reports that she has not had any nasal congestion, rhinorrhea, fevers, neck or facial swelling, difficulty breathing.   The history is provided by the patient.    Past Medical History:  Diagnosis Date  . Asthma   . Cellulitis    cellulitis/abcess x15, "every 2 months"    There are no active problems to display for this patient.   Past Surgical History:  Procedure Laterality Date  . CESAREAN SECTION    . EYE SURGERY    . TUBAL LIGATION       OB History   None      Home Medications    Prior to Admission medications   Medication Sig Start Date End Date Taking? Authorizing Provider  clotrimazole (LOTRIMIN) 1 % cream Apply to affected area 2 times daily 06/22/17  Yes Fawze, Mina A, PA-C  diphenhydrAMINE (BENADRYL) 25 MG tablet Take 25 mg by mouth as needed for allergies.   Yes [provider]    Family History No family history on file.  Social History Social History   Tobacco Use  . Smoking status: Never Smoker  . Smokeless tobacco: Never Used  Substance Use Topics  . Alcohol use: No  . Drug use: No     Allergies   Penicillins and Sulfa antibiotics   Review of Systems Review of Systems  Constitutional: Negative for fever.  HENT: Positive for sore throat. Negative for congestion, drooling, rhinorrhea and trouble swallowing.   Respiratory: Negative for shortness of breath.     Cardiovascular: Negative for chest pain.  Gastrointestinal: Negative for nausea and vomiting.     Physical Exam Updated Vital Signs BP 119/67 (BP Location: Right Arm)   Pulse 93   Temp 98 F (36.7 C) (Oral)   Resp 14   LMP 01/31/2018   SpO2 98%   Physical Exam  Constitutional: She appears well-developed and well-nourished.  HENT:  Head: Normocephalic and atraumatic.  Mouth/Throat: Uvula is midline. No trismus in the jaw. Posterior oropharyngeal erythema present.  Uvula is midline.  No trismus.  Airways patent, phonation is intact.  There is some slight posterior oropharynx erythema and edema but no asymmetry.  No evidence of peritonsillar abscess.  Eyes: Conjunctivae and EOM are normal. Right eye exhibits no discharge. Left eye exhibits no discharge. No scleral icterus.  Pulmonary/Chest: Effort normal.  Neurological: She is alert.  Skin: Skin is warm and dry.  Psychiatric: She has a normal mood and affect. Her speech is normal and behavior is normal.  Nursing note and vitals reviewed.    ED Treatments / Results  Labs (all labs ordered are listed, but only abnormal results are displayed) Labs Reviewed  GROUP A STREP BY PCR  I-STAT BETA HCG BLOOD, ED ([facility], [facility], [facility] ONLY)    EKG None  Radiology No results found.  Procedures Procedures (including critical care time)  Medications Ordered in ED  Medications  dexamethasone (DECADRON) injection 10 mg (10 mg Intramuscular Given 02/22/18 1302)     Initial Impression / Assessment and Plan / ED Course  I have reviewed the triage vital signs and the nursing notes.  Pertinent labs & imaging results that were available during my care of the patient were reviewed by me and considered in my medical decision making (see chart for details).     37 year old female who presents for evaluation of sore throat that began last night.  No fevers, nasal congestion, rhinorrhea.  Reports she has been able to tolerate her secretions but  does report worsening pain with trying to swallow foods.  She has been able to tolerate liquids.  No neck or facial swelling. Patient is afebrile, non-toxic appearing, sitting comfortably on examination table. Vital signs reviewed and stable.  Exam, patient does have some posterior oropharynx erythema and edema.  But there is no asymmetry.  Uvula is midline, no trismus.  Consider pharyngitis versus postnasal drip versus tonsillitis.  History/physical exam is not concerning for Ludwig angina or peritonsillar abscess.  Will plan for rapid strep.  Given that she does have some inflammation, will plan to give Decadron here in the ED.  Rapid strep reviewed.  Negative.  I discussed results with patient.  Patient reports some improvement in pain after Decadron here in the department.  I personally p.o. told patient she was able to drink without any difficulty.  I discussed results with patient.  Instructed her to closely monitor symptoms and to return if she experiences any worsening sore throat, fever, difficulty tolerating p.o., secretions.  Encourage at home supportive therapies.  Vital signs are stable.  Airways patent.  Patient stable for discharge at this time.  Encourage primary care follow-up. Patient had ample opportunity for questions and discussion. All patient's questions were answered with full understanding. Strict return precautions discussed. Patient expresses understanding and agreement to plan.   Final Clinical Impressions(s) / ED Diagnoses   Final diagnoses:  Sore throat    ED Discharge Orders    None       Rosana HoesLayden, Jeanluc Wegman A, PA-C 02/22/18 1450    Bethann BerkshireZammit, Joseph, MD 02/23/18 [phone]

## 2018-02-22 NOTE — ED Notes (Signed)
ED Provider at bedside. 

## 2018-02-22 NOTE — Discharge Instructions (Signed)
You can take Tylenol or Ibuprofen as directed for pain. You can alternate Tylenol and Ibuprofen every 4 hours. If you take Tylenol at 1pm, then you can take Ibuprofen at 5pm. Then you can take Tylenol again at 9pm.   Make sure you are staying hydrated and drinking plenty of fluid.  As we discussed, follow-up with your primary care doctor in the next 24 to 48 hours for further evaluation.  Closely monitor your symptoms and return for any fever, worsening pain, difficulty swallowing her saliva, difficulty eating or drinking, facial or neck swelling or any other worsening or concerning symptoms.

## 2018-02-22 NOTE — ED Triage Notes (Signed)
Sore throat since yesterday. Denies fever/ chills. Endorses swelling and pain with swallowing.

## 2018-02-25 LAB — BASIC METABOLIC PANEL
BUN: 14 (ref 4–21)
CO2: 23 — AB (ref 13–22)
Chloride: 106 (ref 99–108)
Creatinine: 0.7 (ref 0.5–1.1)
Glucose: 82
Potassium: 3.8 (ref 3.4–5.3)
Sodium: 145 (ref 137–147)

## 2018-02-25 LAB — HEPATIC FUNCTION PANEL
ALT: 16 (ref 7–35)
AST: 12 — AB (ref 13–35)
Alkaline Phosphatase: 54 (ref 25–125)
Bilirubin, Total: 0.4

## 2018-02-25 LAB — CBC AND DIFFERENTIAL
HCT: 39 (ref 36–46)
Hemoglobin: 13 (ref 12.0–16.0)
Neutrophils Absolute: 6.9
Platelets: 380 (ref 150–399)
WBC: 11.1

## 2018-02-25 LAB — HEMOGLOBIN A1C: Hemoglobin A1C: 5.2

## 2018-02-25 LAB — LIPID PANEL
Cholesterol: 132 (ref 0–200)
HDL: 48 (ref 35–70)
LDL Cholesterol: 51
Triglycerides: 164 — AB (ref 40–160)

## 2018-02-25 LAB — TSH: TSH: 1.78 (ref 0.41–5.90)

## 2018-02-25 LAB — COMPREHENSIVE METABOLIC PANEL
Albumin: 4.4 (ref 3.5–5.0)
Calcium: 9.7 (ref 8.7–10.7)
GFR calc Af Amer: 124
GFR calc non Af Amer: 108
Globulin: 2.5

## 2018-02-25 LAB — CBC: RBC: 4.61 (ref 3.87–5.11)

## 2018-11-14 ENCOUNTER — Ambulatory Visit (HOSPITAL_COMMUNITY)
Admission: EM | Admit: 2018-11-14 | Discharge: 2018-11-14 | Disposition: A | Discharge status: Home / Self Care | Payer: BLUE CROSS/BLUE SHIELD | Attending: Physician Assistant | Admitting: Physician Assistant

## 2018-11-14 ENCOUNTER — Other Ambulatory Visit: Payer: Self-pay

## 2018-11-14 ENCOUNTER — Encounter (HOSPITAL_COMMUNITY): Payer: Self-pay | Admitting: Emergency Medicine

## 2018-11-14 DIAGNOSIS — R6884 Jaw pain: Secondary | ICD-10-CM | POA: Insufficient documentation

## 2018-11-14 DIAGNOSIS — K068 Other specified disorders of gingiva and edentulous alveolar ridge: Secondary | ICD-10-CM

## 2018-11-14 MED ORDER — NAPROXEN 500 MG PO TABS: 500.0000 mg | ORAL_TABLET | Freq: Two times a day (BID) | ORAL | 0 refills | Status: DC

## 2018-11-14 MED ORDER — CLINDAMYCIN HCL 300 MG PO CAPS: 300.0000 mg | ORAL_CAPSULE | Freq: Three times a day (TID) | ORAL | 0 refills | Status: DC

## 2018-11-14 NOTE — ED Triage Notes (Signed)
The patient presented to the [facility] with a complaint of right side jaw pain x 4 days.

## 2018-11-14 NOTE — ED Provider Notes (Signed)
11/14/2018 1:13 PM   DOB: Oct 01, 1981 / MRN: [id number]  SUBJECTIVE:  Alison Vega is a 38 y.o. female presenting for jaw pain that started about 4 days ago and is worsening.  Patient tells me that she has a history of complication with her lower right jaw that started after a wisdom tooth was extracted.  She tells me "please give me something to help me until I can get to the dentist."  She is allergic to penicillins and sulfa antibiotics.   She  has a past medical history of Asthma and Cellulitis.    She  reports that she has never smoked. She has never used smokeless tobacco. She reports that she does not drink alcohol or use drugs. She  reports being sexually active. She reports using the following method of birth control/protection: None. The patient  has a past surgical history that includes Eye surgery; Cesarean section; and Tubal ligation.  Her family history is not on file.  Review of Systems  Constitutional: Negative for chills, diaphoresis and fever.  Gastrointestinal: Negative for nausea.  Skin: Negative for rash.  Neurological: Negative for dizziness.    OBJECTIVE:  BP (!) 149/96 (BP Location: Right Arm)   Pulse 85   Temp 98.6 F (37 C) (Oral)   Resp 18   LMP 11/02/2018   SpO2 100%   Wt Readings from Last 3 Encounters:  07/26/15 150 lb (68 kg)   Temp Readings from Last 3 Encounters:  11/14/18 98.6 F (37 C) (Oral)  02/22/18 98 F (36.7 C) (Oral)  06/22/17 97.9 F (36.6 C) (Oral)   BP Readings from Last 3 Encounters:  11/14/18 (!) 149/96  02/22/18 119/67  06/22/17 127/85   Pulse Readings from Last 3 Encounters:  11/14/18 85  02/22/18 93  06/22/17 71    Physical Exam Vitals signs reviewed.  Constitutional:      Appearance: She is well-developed. She is not diaphoretic.  HENT:     Head:   Eyes:     Pupils: Pupils are equal, round, and reactive to light.  Cardiovascular:     Rate and Rhythm: Normal rate.  Pulmonary:     Effort: Pulmonary effort  is normal.  Abdominal:     General: There is no distension.  Musculoskeletal: Normal range of motion.  Skin:    General: Skin is warm and dry.  Neurological:     Mental Status: She is alert and oriented to person, place, and time.     Cranial Nerves: No cranial nerve deficit.     No results found for this or any previous visit (from the past 72 hour(s)).  No results found.  ASSESSMENT AND PLAN:   Jaw pain  Gingival erythema    Discharge Instructions     Start the naproxen and the antibiotic as prescribed today.  I agree that she need to see a dentist.        The patient is advised to call or return to clinic if she does not see an improvement in symptoms, or to seek the care of the closest emergency department if she worsens with the above plan.   Deliah Boston, MHS, PA-C 11/14/2018 1:13 PM   Ofilia Neas, PA-C 11/14/18 1313

## 2018-11-14 NOTE — Discharge Instructions (Addendum)
Start the naproxen and the antibiotic as prescribed today.  I agree that she need to see a dentist.

## 2019-06-03 ENCOUNTER — Encounter (HOSPITAL_COMMUNITY): Payer: Self-pay | Admitting: Emergency Medicine

## 2019-06-03 ENCOUNTER — Emergency Department (HOSPITAL_COMMUNITY)
Admission: EM | Admit: 2019-06-03 | Discharge: 2019-06-03 | Disposition: A | Discharge status: Home / Self Care | Payer: BC Managed Care – PPO | Attending: Emergency Medicine | Admitting: Emergency Medicine

## 2019-06-03 ENCOUNTER — Emergency Department (HOSPITAL_COMMUNITY): Payer: BC Managed Care – PPO

## 2019-06-03 DIAGNOSIS — Y999 Unspecified external cause status: Secondary | ICD-10-CM | POA: Diagnosis not present

## 2019-06-03 DIAGNOSIS — Y939 Activity, unspecified: Secondary | ICD-10-CM | POA: Insufficient documentation

## 2019-06-03 DIAGNOSIS — S62326A Displaced fracture of shaft of fifth metacarpal bone, right hand, initial encounter for closed fracture: Secondary | ICD-10-CM | POA: Diagnosis not present

## 2019-06-03 DIAGNOSIS — Y92019 Unspecified place in single-family (private) house as the place of occurrence of the external cause: Secondary | ICD-10-CM | POA: Diagnosis not present

## 2019-06-03 DIAGNOSIS — S6991XA Unspecified injury of right wrist, hand and finger(s), initial encounter: Secondary | ICD-10-CM | POA: Diagnosis present

## 2019-06-03 DIAGNOSIS — W2209XA Striking against other stationary object, initial encounter: Secondary | ICD-10-CM | POA: Diagnosis not present

## 2019-06-03 MED ORDER — IBUPROFEN 600 MG PO TABS: 600.0000 mg | ORAL_TABLET | Freq: Four times a day (QID) | ORAL | 0 refills | Status: DC | PRN

## 2019-06-03 MED ORDER — HYDROCODONE-ACETAMINOPHEN 5-325 MG PO TABS: 1.0000 | ORAL_TABLET | Freq: Four times a day (QID) | ORAL | 0 refills | Status: DC | PRN

## 2019-06-03 NOTE — Progress Notes (Signed)
Orthopedic Tech Progress Note Patient Details:  Alison Vega 12-23-1980 [id number]  Ortho Devices Type of Ortho Device: Ace wrap, Ulna gutter splint Ortho Device/Splint Location: right Ortho Device/Splint Interventions: Application   Post Interventions Patient Tolerated: Well Instructions Provided: Care of device   [place] Pink 06/03/2019, 9:37 AM

## 2019-06-03 NOTE — H&P (View-Only) (Signed)
[facility] for Consult[facility] Referring Physician: Nonie HoyerJ Knapp  Alison Vega is an 38 y.o. female.  HPI: Alison Vega got mad and punched a brick wall with her right hand about 5d ago. She's been trying to tough it out but finally came to the ED for evaluation. X-rays showed a 5th [facility] fx and hand surgery was consulted. She works in a [employer], [employer] and is RHD.  Past Medical History:  Diagnosis Date  . Asthma   . Cellulitis    cellulitis/abcess x15, "every 2 months"    Past Surgical History:  Procedure Laterality Date  . CESAREAN SECTION    . EYE SURGERY    . TUBAL LIGATION      No family history on file.  Social History:  reports that she has never smoked. She has never used smokeless tobacco. She reports that she does not drink alcohol or use drugs.  Allergies:  Allergies  Allergen Reactions  . Penicillins Other (See Comments)    Shut patients throat.  . Sulfa Antibiotics Other (See Comments)    Shuts patient's throat.    Medications: I have reviewed the patient's current medications.  No results found for this or any previous visit (from the past 48 hour(s)).  Dg Wrist Complete Right  Result Date: 06/03/2019 CLINICAL DATA:  Hit Brick Wall With Fist Pain 5th Metacarpal Did This Wednesday EXAM: RIGHT WRIST - COMPLETE 3+ VIEW; RIGHT HAND - COMPLETE 3+ VIEW COMPARISON:  None. FINDINGS: Hand: Displaced oblique fracture at the distal aspect of the fifth metacarpal with palmar angulation. No evidence of intra-articular involvement. Joint spaces and alignment are well maintained without evidence dislocation. There is regional soft tissue swelling. Wrist: There is no evidence of fracture or dislocation. There is no evidence of arthropathy or other focal bone abnormality. Soft tissues are unremarkable. IMPRESSION: Displaced angulated oblique fracture of the fifth metacarpal of the right hand. These results were called by telephone at the time of interpretation on 06/03/2019 at 9:05 am to Dr.  Dalene SeltzerSchlossman, who verbally acknowledged these results. Electronically Signed   By: Emmaline KluverNancy  Ballantyne M.D.   On: 06/03/2019 09:06   Dg Hand Complete Right  Result Date: 06/03/2019 CLINICAL DATA:  Hit Brick Wall With Fist Pain 5th Metacarpal Did This Wednesday EXAM: RIGHT WRIST - COMPLETE 3+ VIEW; RIGHT HAND - COMPLETE 3+ VIEW COMPARISON:  None. FINDINGS: Hand: Displaced oblique fracture at the distal aspect of the fifth metacarpal with palmar angulation. No evidence of intra-articular involvement. Joint spaces and alignment are well maintained without evidence dislocation. There is regional soft tissue swelling. Wrist: There is no evidence of fracture or dislocation. There is no evidence of arthropathy or other focal bone abnormality. Soft tissues are unremarkable. IMPRESSION: Displaced angulated oblique fracture of the fifth metacarpal of the right hand. These results were called by telephone at the time of interpretation on 06/03/2019 at 9:05 am to Dr. Dalene SeltzerSchlossman, who verbally acknowledged these results. Electronically Signed   By: Emmaline KluverNancy  Ballantyne M.D.   On: 06/03/2019 09:06    Review of Systems  Constitutional: Negative for weight loss.  HENT: Negative for ear discharge, ear pain, hearing loss and tinnitus.   Eyes: Negative for blurred vision, double vision, photophobia and pain.  Respiratory: Negative for cough, sputum production and shortness of breath.   Cardiovascular: Negative for chest pain.  Gastrointestinal: Negative for abdominal pain, nausea and vomiting.  Genitourinary: Negative for dysuria, flank pain, frequency and urgency.  Musculoskeletal: Positive for joint pain (Right hand). Negative for back  pain, falls, myalgias and neck pain.  Neurological: Negative for dizziness, tingling, sensory change, focal weakness, loss of consciousness and headaches.  Endo/Heme/Allergies: Does not bruise/bleed easily.  Psychiatric/Behavioral: Negative for depression, memory loss and substance abuse. The  patient is not nervous/anxious.    Blood pressure 132/66, pulse 64, temperature 98.2 F (36.8 C), temperature source Oral, resp. rate 16, last menstrual period 05/16/2019, SpO2 97 %. Physical Exam  Constitutional: She appears well-developed and well-nourished. No distress.  HENT:  Head: Normocephalic and atraumatic.  Eyes: Conjunctivae are normal. Right eye exhibits no discharge. Left eye exhibits no discharge. No scleral icterus.  Neck: Normal range of motion.  Cardiovascular: Normal rate and regular rhythm.  Respiratory: Effort normal. No respiratory distress.  Musculoskeletal:     Comments: Right shoulder, elbow, wrist, digits- no skin wounds, ulnar aspect of hand edematous, mod TTP, no instability, no blocks to motion  Sens  Ax/R/M/U intact  Mot   Ax/ R/ PIN/ M/ AIN/ U intact  Rad 2+  Neurological: She is alert.  Skin: Skin is warm and dry. She is not diaphoretic.  Psychiatric: She has a normal mood and affect. Her behavior is normal.    Assessment/Plan: Right [facility]th [facility] fx -- Will place in ulnar gutter splint and have her f/u with Dr. Grandville Silos on Tuesday.    Lisette Abu, PA-C Orthopedic Surgery [phone] 06/03/2019, 10:07 AM   Displaced angulated R 5 [facility] fx. Splinted in ED.  Will f/u in office with discussion regarding nonop vs op tx  Micheline Rough, MD Hand Surgery

## 2019-06-03 NOTE — ED Notes (Signed)
Ortho tech to come apply splint 

## 2019-06-03 NOTE — ED Provider Notes (Signed)
[facility]ERGENCY [facility]PARTMENT Provider Note   CSN: [id number] Arrival date & time: 06/03/19  0800    History   Chief Complaint Chief Complaint  Patient presents with  . Hand Injury    HPI Alison Vega is a 38 y.o. female presents for evaluation of acute onset, persistent right hand pain and swelling for 5 days.  Reports that her neighbor "did something to make me upset so I punched my fireplace which is made of brick ".  Since then she has had a constant dull pain to the ulnar aspect of the right hand which becomes sharp with certain movements of the pinky finger.  Pain worsens with palpation and movement.  Reports intermittent numbness and tingling of the right fifth digit.  Denies fever.  Has been taking ibuprofen without relief of her symptoms. She is right-hand dominant.    The history is provided by the patient.    Past Medical History:  Diagnosis Date  . Asthma   . Cellulitis    cellulitis/abcess x15, "every 2 months"    There are no active problems to display for this patient.   Past Surgical History:  Procedure Laterality Date  . CESAREAN SECTION    . EYE SURGERY    . TUBAL LIGATION       OB History   No obstetric history on file.      Home Medications    Prior to Admission medications   Medication Sig Start Date End Date Taking? Authorizing Provider  clindamycin (CLEOCIN) 300 MG capsule Take 1 capsule (300 mg total) by mouth 3 (three) times daily. 11/14/18   Ofilia Neaslark, Michael L, PA-C  HYDROcodone-acetaminophen (NORCO/VICODIN) 5-325 MG tablet Take 1 tablet by mouth every 6 (six) hours as needed for severe pain. 06/03/19   Jackelin Correia A, PA-C  ibuprofen (ADVIL) 600 MG tablet Take 1 tablet (600 mg total) by mouth every 6 (six) hours as needed. 06/03/19   Akyla Vavrek A, PA-C  naproxen (NAPROSYN) 500 MG tablet Take 1 tablet (500 mg total) by mouth 2 (two) times daily. 11/14/18   Ofilia Neaslark, Michael L, PA-C    Family History No family history on  file.  Social History Social History   Tobacco Use  . Smoking status: Never Smoker  . Smokeless tobacco: Never Used  Substance Use Topics  . Alcohol use: No  . Drug use: No     Allergies   Penicillins and Sulfa antibiotics   Review of Systems Review of Systems  Constitutional: Negative for fever.  Musculoskeletal: Positive for arthralgias and joint swelling.  Neurological: Positive for numbness.     Physical Exam Updated Vital Signs BP 132/66 (BP Location: Left Arm)   Pulse 64   Temp 98.2 F (36.8 C) (Oral)   Resp 16   LMP 05/16/2019   SpO2 97%   Physical Exam Vitals signs and nursing note reviewed.  Constitutional:      General: She is not in acute distress.    Appearance: She is well-developed.  HENT:     Head: Normocephalic and atraumatic.  Eyes:     General:        Right eye: No discharge.        Left eye: No discharge.     Conjunctiva/sclera: Conjunctivae normal.  Neck:     Vascular: No JVD.     Trachea: No tracheal deviation.  Cardiovascular:     Rate and Rhythm: Normal rate.     Pulses: Normal pulses.  Comments: 2+ radial pulses bilaterally. Pulmonary:     Effort: Pulmonary effort is normal.  Abdominal:     General: There is no distension.  Musculoskeletal:        General: Swelling and tenderness present.     Comments: Swelling noted overlying the fifth metacarpal.  She has some ecchymosis to the dorsum of the right hand as well.  Tenderness to palpation maximally overlying the distal fifth metacarpal.  Extension of the fifth digit somewhat limited secondary to pain but otherwise 5/5 strength of wrist and digits with flexion and extension against resistance bilaterally.  Good grip strength bilaterally.  No scaphoid tenderness.  Skin:    General: Skin is warm and dry.     Findings: No erythema.  Neurological:     Mental Status: She is alert.     Comments: Fluent speech, no facial droop.  Altered sensation to light touch of the ulnar aspect  of the right fifth digit but otherwise sensation intact to light touch of the bilateral upper extremities.  Psychiatric:        Behavior: Behavior normal.      ED Treatments / Results  Labs (all labs ordered are listed, but only abnormal results are displayed) Labs Reviewed - No data to display  EKG None  Radiology Dg Wrist Complete Right  Result Date: 06/03/2019 CLINICAL DATA:  Hit Brick Wall With Fist Pain 5th Metacarpal Did This Wednesday EXAM: RIGHT WRIST - COMPLETE 3+ VIEW; RIGHT HAND - COMPLETE 3+ VIEW COMPARISON:  None. FINDINGS: Hand: Displaced oblique fracture at the distal aspect of the fifth metacarpal with palmar angulation. No evidence of intra-articular involvement. Joint spaces and alignment are well maintained without evidence dislocation. There is regional soft tissue swelling. Wrist: There is no evidence of fracture or dislocation. There is no evidence of arthropathy or other focal bone abnormality. Soft tissues are unremarkable. IMPRESSION: Displaced angulated oblique fracture of the fifth metacarpal of the right hand. These results were called by telephone at the time of interpretation on 06/03/2019 at 9:05 am to Dr. Dalene SeltzerSchlossman, who verbally acknowledged these results. Electronically Signed   By: Emmaline KluverNancy  Ballantyne M.D.   On: 06/03/2019 09:06   Dg Hand Complete Right  Result Date: 06/03/2019 CLINICAL DATA:  Hit Brick Wall With Fist Pain 5th Metacarpal Did This Wednesday EXAM: RIGHT WRIST - COMPLETE 3+ VIEW; RIGHT HAND - COMPLETE 3+ VIEW COMPARISON:  None. FINDINGS: Hand: Displaced oblique fracture at the distal aspect of the fifth metacarpal with palmar angulation. No evidence of intra-articular involvement. Joint spaces and alignment are well maintained without evidence dislocation. There is regional soft tissue swelling. Wrist: There is no evidence of fracture or dislocation. There is no evidence of arthropathy or other focal bone abnormality. Soft tissues are unremarkable.  IMPRESSION: Displaced angulated oblique fracture of the fifth metacarpal of the right hand. These results were called by telephone at the time of interpretation on 06/03/2019 at 9:05 am to Dr. Dalene SeltzerSchlossman, who verbally acknowledged these results. Electronically Signed   By: Emmaline KluverNancy  Ballantyne M.D.   On: 06/03/2019 09:06    Procedures Procedures (including critical care time)  Medications Ordered in ED Medications - No data to display   Initial Impression / Assessment and Plan / ED Course  I have reviewed the triage vital signs and the nursing notes.  Pertinent labs & imaging results that were available during my care of the patient were reviewed by me and considered in my medical decision making (see chart for details).  Patient with right hand pain and swelling after injury on Sunday 5 days ago.  She is afebrile, vital signs are stable.  She is nontoxic in appearance.  Has some numbness and tingling to the right fifth digit but otherwise neurovascularly intact.  Radiographs show a displaced angulated oblique fracture of the fifth metacarpal of the right hand.  9:15AM Spoke with Michael Jefferey, Ortho PA, in consultation who recommends ulnar gutter splint and will see the patient emergently in the ED for further evaluation.  10:00AM Michael has seen and evaluated the patient, advises that the patient will be seen by Dr. Thompson in the office on an outpatient basis on Tuesday.  Patient neurovascularly intact after application of ulnar gutter splint.  No concerns for compartment syndrome.  No evidence of DVT.  Discussed splint care, management of pain with Tylenol and NSAIDs.  Also discussed use of hydrocodone and advised of side effects.  She understands that she will receive a phone call today to set up an appointment to see Dr. Thompson in the office on Tuesday.  Discussed strict ED return precautions. Patient verbalized understanding of and agreement with plan and is safe for  discharge home at this time.  Final Clinical Impressions(s) / ED Diagnoses   Final diagnoses:  Closed displaced fracture of shaft of fifth metacarpal bone of right hand, initial encounter    ED Discharge Orders         Ordered    ibuprofen (ADVIL) 600 MG tablet  Every 6 hours PRN     06/03/19 0954    HYDROcodone-acetaminophen (NORCO/VICODIN) 5-325 MG tablet  Every 6 hours PRN     07 /31/20 Despard, Carm[place]aels, PA-C 06/03/19 1017    Dorie Rank, MD 06/04/19 [phone]

## 2019-06-03 NOTE — Discharge Instructions (Signed)
1. Medications: You can take 600 mg of ibuprofen every 6 hours as needed for pain with food.  You can also take 1 to 2 tablets of Tylenol every 6 hours as needed for pain. Do not exceed 4000 mg of Tylenol daily.  If your pain is very severe you can take hydrocodone but be aware this medication also contains Tylenol.  This medicine can cause drowsiness so do not drive, drink alcohol, or operate heavy machinery while taking this medication.  Can take before bed.  It can also cause constipation so you can take a stool softener to avoid this.  2. Treatment: Wear the splint at all times.  Keep it clean and dry.  You can cover it with a bag and tape it so that it does not get wet when you shower.  Rest, ice, elevate, drink plenty of fluids  3. Follow Up: Dr. Biagio Borg office will call you today to make an appointment for follow-up on Tuesday; follow-up will be very important.  Please return to the ER for worsening symptoms or other concerns such as worsening swelling, redness of the skin, fevers, loss of pulses, or loss of feeling

## 2019-06-03 NOTE — ED Triage Notes (Signed)
Pt punched a wall on Sunday with her right fist and has had significant swelling since. Pt has swelling in right hand that is now moving up right forearm. Pt has not had any medical care this week.

## 2019-06-03 NOTE — Consult Note (Addendum)
Reason for Consult:5th MC fx Referring Physician: J Knapp  Alison Vega is an 38 y.o. female.  HPI: Breean got mad and punched a brick wall with her right hand about 5d ago. She's been trying to tough it out but finally came to the ED for evaluation. X-rays showed a 5th MC fx and hand surgery was consulted. 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  Past Medical History:  Diagnosis Date  . Asthma   . Cellulitis    cellulitis/abcess x15, "every 2 months"    Past Surgical History:  Procedure Laterality Date  . CESAREAN SECTION    . EYE SURGERY    . TUBAL LIGATION      No family history on file.  Social History:  reports that she has never smoked. She has never used smokeless tobacco. She reports that she does not drink alcohol or use drugs.  Allergies:  Allergies  Allergen Reactions  . Penicillins Other (See Comments)    Shut patients throat.  . Sulfa Antibiotics Other (See Comments)    Shuts patient's throat.    Medications: I have reviewed the patient's current medications.  No results found for this or any previous visit (from the past 48 hour(s)).  Dg Wrist Complete Right  Result Date: 06/03/2019 CLINICAL DATA:  Hit Brick Wall With Fist Pain 5th Metacarpal Did This Wednesday EXAM: RIGHT WRIST - COMPLETE 3+ VIEW; RIGHT HAND - COMPLETE 3+ VIEW COMPARISON:  None. FINDINGS: Hand: Displaced oblique fracture at the distal aspect of the fifth metacarpal with palmar angulation. No evidence of intra-articular involvement. Joint spaces and alignment are well maintained without evidence dislocation. There is regional soft tissue swelling. Wrist: There is no evidence of fracture or dislocation. There is no evidence of arthropathy or other focal bone abnormality. Soft tissues are unremarkable. IMPRESSION: Displaced angulated oblique fracture of the fifth metacarpal of the right hand. These results were called by telephone at the time of interpretation on 06/03/2019 at 9:05 am to Dr.  Schlossman, who verbally acknowledged these results. Electronically Signed   By: Nancy  Ballantyne M.D.   On: 06/03/2019 09:06   Dg Hand Complete Right  Result Date: 06/03/2019 CLINICAL DATA:  Hit Brick Wall With Fist Pain 5th Metacarpal Did This Wednesday EXAM: RIGHT WRIST - COMPLETE 3+ VIEW; RIGHT HAND - COMPLETE 3+ VIEW COMPARISON:  None. FINDINGS: Hand: Displaced oblique fracture at the distal aspect of the fifth metacarpal with palmar angulation. No evidence of intra-articular involvement. Joint spaces and alignment are well maintained without evidence dislocation. There is regional soft tissue swelling. Wrist: There is no evidence of fracture or dislocation. There is no evidence of arthropathy or other focal bone abnormality. Soft tissues are unremarkable. IMPRESSION: Displaced angulated oblique fracture of the fifth metacarpal of the right hand. These results were called by telephone at the time of interpretation on 06/03/2019 at 9:05 am to Dr. Schlossman, who verbally acknowledged these results. Electronically Signed   By: Nancy  Ballantyne M.D.   On: 06/03/2019 09:06    Review of Systems  Constitutional: Negative for weight loss.  HENT: Negative for ear discharge, ear pain, hearing loss and tinnitus.   Eyes: Negative for blurred vision, double vision, photophobia and pain.  Respiratory: Negative for cough, sputum production and shortness of breath.   Cardiovascular: Negative for chest pain.  Gastrointestinal: Negative for abdominal pain, nausea and vomiting.  Genitourinary: Negative for dysuria, flank pain, frequency and urgency.  Musculoskeletal: Positive for joint pain (Right hand). Negative for back   pain, falls, myalgias and neck pain.  Neurological: Negative for dizziness, tingling, sensory change, focal weakness, loss of consciousness and headaches.  Endo/Heme/Allergies: Does not bruise/bleed easily.  Psychiatric/Behavioral: Negative for depression, memory loss and substance abuse. The  patient is not nervous/anxious.    Blood pressure 132/66, pulse 64, temperature 98.2 F (36.8 C), temperature source Oral, resp. rate 16, last menstrual period 05/16/2019, SpO2 97 %. Physical Exam  Constitutional: She appears well-developed and well-nourished. No distress.  HENT:  Head: Normocephalic and atraumatic.  Eyes: Conjunctivae are normal. Right eye exhibits no discharge. Left eye exhibits no discharge. No scleral icterus.  Neck: Normal range of motion.  Cardiovascular: Normal rate and regular rhythm.  Respiratory: Effort normal. No respiratory distress.  Musculoskeletal:     Comments: Right shoulder, elbow, wrist, digits- no skin wounds, ulnar aspect of hand edematous, mod TTP, no instability, no blocks to motion  Sens  Ax/R/M/U intact  Mot   Ax/ R/ PIN/ M/ AIN/ U intact  Rad 2+  Neurological: She is alert.  Skin: Skin is warm and dry. She is not diaphoretic.  Psychiatric: She has a normal mood and affect. Her behavior is normal.    Assessment/Plan: Right 5th MC fx -- Will place in ulnar gutter splint and have her f/u with Dr. Grandville Silos on Tuesday.    Lisette Abu, PA-C Orthopedic Surgery [phone] 06/03/2019, 10:07 AM   Displaced angulated R 5 MC fx. Splinted in ED.  Will f/u in office with discussion regarding nonop vs op tx  Micheline Rough, MD Hand Surgery

## 2019-06-07 ENCOUNTER — Other Ambulatory Visit: Payer: Self-pay | Admitting: Orthopedic Surgery

## 2019-06-07 ENCOUNTER — Encounter (HOSPITAL_BASED_OUTPATIENT_CLINIC_OR_DEPARTMENT_OTHER): Payer: Self-pay | Admitting: *Deleted

## 2019-06-07 ENCOUNTER — Other Ambulatory Visit: Payer: Self-pay

## 2019-06-09 ENCOUNTER — Other Ambulatory Visit (HOSPITAL_COMMUNITY): Payer: BC Managed Care – PPO

## 2019-06-09 ENCOUNTER — Other Ambulatory Visit (HOSPITAL_COMMUNITY)
Admission: RE | Admit: 2019-06-09 | Discharge: 2019-06-09 | Disposition: A | Discharge status: Home / Self Care | Payer: BC Managed Care – PPO | Source: Ambulatory Visit | Attending: Orthopedic Surgery | Admitting: Orthopedic Surgery

## 2019-06-09 DIAGNOSIS — Z20828 Contact with and (suspected) exposure to other viral communicable diseases: Secondary | ICD-10-CM | POA: Insufficient documentation

## 2019-06-09 DIAGNOSIS — Z01812 Encounter for preprocedural laboratory examination: Secondary | ICD-10-CM | POA: Diagnosis present

## 2019-06-09 LAB — SARS CORONAVIRUS 2 (TAT 6-24 HRS): SARS Coronavirus 2: NEGATIVE

## 2019-06-13 ENCOUNTER — Encounter (HOSPITAL_BASED_OUTPATIENT_CLINIC_OR_DEPARTMENT_OTHER)
Admission: RE | Disposition: A | Discharge status: Home / Self Care | Payer: Self-pay | Source: Home / Self Care | Attending: Orthopedic Surgery

## 2019-06-13 ENCOUNTER — Other Ambulatory Visit: Payer: Self-pay

## 2019-06-13 ENCOUNTER — Ambulatory Visit (HOSPITAL_BASED_OUTPATIENT_CLINIC_OR_DEPARTMENT_OTHER)
Admission: RE | Admit: 2019-06-13 | Discharge: 2019-06-13 | Disposition: A | Discharge status: Home / Self Care | Payer: BC Managed Care – PPO | Attending: Orthopedic Surgery | Admitting: Orthopedic Surgery

## 2019-06-13 ENCOUNTER — Ambulatory Visit (HOSPITAL_COMMUNITY): Payer: BC Managed Care – PPO

## 2019-06-13 ENCOUNTER — Ambulatory Visit (HOSPITAL_BASED_OUTPATIENT_CLINIC_OR_DEPARTMENT_OTHER): Payer: BC Managed Care – PPO | Admitting: Certified Registered"

## 2019-06-13 ENCOUNTER — Encounter (HOSPITAL_BASED_OUTPATIENT_CLINIC_OR_DEPARTMENT_OTHER): Payer: Self-pay | Admitting: Certified Registered"

## 2019-06-13 DIAGNOSIS — Z881 Allergy status to other antibiotic agents status: Secondary | ICD-10-CM | POA: Insufficient documentation

## 2019-06-13 DIAGNOSIS — Z880 Allergy status to penicillin: Secondary | ICD-10-CM | POA: Diagnosis not present

## 2019-06-13 DIAGNOSIS — W228XXA Striking against or struck by other objects, initial encounter: Secondary | ICD-10-CM | POA: Diagnosis not present

## 2019-06-13 DIAGNOSIS — Z882 Allergy status to sulfonamides status: Secondary | ICD-10-CM | POA: Diagnosis not present

## 2019-06-13 DIAGNOSIS — Z4789 Encounter for other orthopedic aftercare: Secondary | ICD-10-CM

## 2019-06-13 DIAGNOSIS — S62336A Displaced fracture of neck of fifth metacarpal bone, right hand, initial encounter for closed fracture: Secondary | ICD-10-CM | POA: Insufficient documentation

## 2019-06-13 HISTORY — PX: OPEN REDUCTION INTERNAL FIXATION (ORIF) METACARPAL: SHX6234

## 2019-06-13 SURGERY — OPEN REDUCTION INTERNAL FIXATION (ORIF) METACARPAL
Anesthesia: Monitor Anesthesia Care | Site: Hand | Laterality: Right

## 2019-06-13 MED ORDER — OXYCODONE HCL 5 MG/5ML PO SOLN: 5.0000 mg | Freq: Once | ORAL | Status: DC | PRN

## 2019-06-13 MED ORDER — BUPIVACAINE-EPINEPHRINE (PF) 0.5% -1:200000 IJ SOLN
INTRAMUSCULAR | Status: DC | PRN
  Administered 2019-06-13: 30 mL via PERINEURAL

## 2019-06-13 MED ORDER — OXYCODONE HCL 5 MG PO TABS: 5.0000 mg | ORAL_TABLET | Freq: Once | ORAL | Status: DC | PRN

## 2019-06-13 MED ORDER — FENTANYL CITRATE (PF) 100 MCG/2ML IJ SOLN
INTRAMUSCULAR | Status: AC
  Filled 2019-06-13: qty 2

## 2019-06-13 MED ORDER — POVIDONE-IODINE 10 % EX SWAB: 2.0000 "application " | Freq: Once | CUTANEOUS | Status: DC

## 2019-06-13 MED ORDER — MIDAZOLAM HCL 2 MG/2ML IJ SOLN
INTRAMUSCULAR | Status: AC
  Filled 2019-06-13: qty 2

## 2019-06-13 MED ORDER — FENTANYL CITRATE (PF) 100 MCG/2ML IJ SOLN: 25.0000 ug | INTRAMUSCULAR | Status: DC | PRN

## 2019-06-13 MED ORDER — ONDANSETRON HCL 4 MG/2ML IJ SOLN
INTRAMUSCULAR | Status: DC | PRN
  Administered 2019-06-13: 4 mg via INTRAVENOUS

## 2019-06-13 MED ORDER — PROPOFOL 500 MG/50ML IV EMUL
INTRAVENOUS | Status: DC | PRN
  Administered 2019-06-13: 120 ug/kg/min via INTRAVENOUS

## 2019-06-13 MED ORDER — MIDAZOLAM HCL 2 MG/2ML IJ SOLN
1.0000 mg | INTRAMUSCULAR | Status: DC | PRN
  Administered 2019-06-13: 1 mg via INTRAVENOUS

## 2019-06-13 MED ORDER — CLINDAMYCIN PHOSPHATE 900 MG/50ML IV SOLN
900.0000 mg | INTRAVENOUS | Status: AC
  Administered 2019-06-13: 900 mg via INTRAVENOUS

## 2019-06-13 MED ORDER — ONDANSETRON HCL 4 MG/2ML IJ SOLN: 4.0000 mg | Freq: Four times a day (QID) | INTRAMUSCULAR | Status: DC | PRN

## 2019-06-13 MED ORDER — CLINDAMYCIN PHOSPHATE 900 MG/50ML IV SOLN
INTRAVENOUS | Status: AC
  Filled 2019-06-13: qty 50

## 2019-06-13 MED ORDER — FENTANYL CITRATE (PF) 100 MCG/2ML IJ SOLN
50.0000 ug | INTRAMUSCULAR | Status: DC | PRN
  Administered 2019-06-13: 50 ug via INTRAVENOUS

## 2019-06-13 MED ORDER — SCOPOLAMINE 1 MG/3DAYS TD PT72: 1.0000 | MEDICATED_PATCH | Freq: Once | TRANSDERMAL | Status: DC

## 2019-06-13 MED ORDER — LACTATED RINGERS IV SOLN
INTRAVENOUS | Status: DC
  Administered 2019-06-13: 09:00:00 via INTRAVENOUS

## 2019-06-13 MED ORDER — ACETAMINOPHEN 325 MG PO TABS: 650.0000 mg | ORAL_TABLET | Freq: Four times a day (QID) | ORAL | Status: DC

## 2019-06-13 MED ORDER — LIDOCAINE HCL (CARDIAC) PF 100 MG/5ML IV SOSY
PREFILLED_SYRINGE | INTRAVENOUS | Status: DC | PRN
  Administered 2019-06-13: 30 mg via INTRAVENOUS

## 2019-06-13 MED ORDER — OXYCODONE HCL 5 MG PO TABS: 5.0000 mg | ORAL_TABLET | Freq: Four times a day (QID) | ORAL | 0 refills | Status: DC | PRN

## 2019-06-13 MED ORDER — IBUPROFEN 200 MG PO TABS: 600.0000 mg | ORAL_TABLET | Freq: Four times a day (QID) | ORAL | Status: DC

## 2019-06-13 SURGICAL SUPPLY — 54 items
BIT DRILL 1.1 MINI QC NONSTRL (BIT) ×3 IMPLANT
BLADE MINI RND TIP GREEN BEAV (BLADE) IMPLANT
BLADE SURG 15 STRL LF DISP TIS (BLADE) ×1 IMPLANT
BLADE SURG 15 STRL SS (BLADE) ×2
BNDG COHESIVE 4X5 TAN STRL (GAUZE/BANDAGES/DRESSINGS) ×3 IMPLANT
BNDG ESMARK 4X9 LF (GAUZE/BANDAGES/DRESSINGS) ×3 IMPLANT
BNDG GAUZE ELAST 4 BULKY (GAUZE/BANDAGES/DRESSINGS) ×3 IMPLANT
CANISTER SUCTION 1200CC (MISCELLANEOUS) IMPLANT
CHLORAPREP W/TINT 26 (MISCELLANEOUS) ×3 IMPLANT
CORD BIPOLAR FORCEPS 12FT (ELECTRODE) ×3 IMPLANT
COVER BACK TABLE REUSABLE LG (DRAPES) ×3 IMPLANT
COVER MAYO STAND REUSABLE (DRAPES) ×3 IMPLANT
COVER WAND RF STERILE (DRAPES) IMPLANT
CUFF TOURN SGL QUICK 18X4 (TOURNIQUET CUFF) IMPLANT
DRAPE C-ARM 42X72 X-RAY (DRAPES) ×3 IMPLANT
DRAPE EXTREMITY T 121X128X90 (DISPOSABLE) ×3 IMPLANT
DRAPE SURG 17X23 STRL (DRAPES) ×3 IMPLANT
DRSG EMULSION OIL 3X3 NADH (GAUZE/BANDAGES/DRESSINGS) ×3 IMPLANT
GAUZE SPONGE 4X4 12PLY STRL LF (GAUZE/BANDAGES/DRESSINGS) ×3 IMPLANT
GLOVE BIO SURGEON STRL SZ7.5 (GLOVE) ×3 IMPLANT
GLOVE BIOGEL PI IND STRL 7.0 (GLOVE) ×1 IMPLANT
GLOVE BIOGEL PI IND STRL 8 (GLOVE) ×1 IMPLANT
GLOVE BIOGEL PI INDICATOR 7.0 (GLOVE) ×2
GLOVE BIOGEL PI INDICATOR 8 (GLOVE) ×2
GLOVE ECLIPSE 6.5 STRL STRAW (GLOVE) ×3 IMPLANT
GOWN STRL REUS W/ TWL LRG LVL3 (GOWN DISPOSABLE) ×2 IMPLANT
GOWN STRL REUS W/TWL LRG LVL3 (GOWN DISPOSABLE) ×4
GOWN STRL REUS W/TWL XL LVL3 (GOWN DISPOSABLE) ×3 IMPLANT
LOCK SCREW 1.5X9MM (Screw) ×3 IMPLANT
NEEDLE HYPO 25X1 1.5 SAFETY (NEEDLE) IMPLANT
NS IRRIG 1000ML POUR BTL (IV SOLUTION) ×3 IMPLANT
PACK BASIN DAY SURGERY FS (CUSTOM PROCEDURE TRAY) ×3 IMPLANT
PADDING CAST ABS 4INX4YD NS (CAST SUPPLIES)
PADDING CAST ABS COTTON 4X4 ST (CAST SUPPLIES) IMPLANT
PLATE T SMALL 1.5MM (Plate) ×3 IMPLANT
SCREW L 1.5X12 (Screw) ×6 IMPLANT
SCREW LOCK 1.5X9MM (Screw) ×1 IMPLANT
SCREW LOCKING 1.5X10 (Screw) ×6 IMPLANT
SCREW LOCKING 1.5X11MM (Screw) ×9 IMPLANT
SLEEVE SCD COMPRESS KNEE MED (MISCELLANEOUS) ×3 IMPLANT
SPLINT PLASTER CAST XFAST 3X15 (CAST SUPPLIES) ×6 IMPLANT
SPLINT PLASTER XTRA FASTSET 3X (CAST SUPPLIES) ×12
STOCKINETTE 6  STRL (DRAPES) ×2
STOCKINETTE 6 STRL (DRAPES) ×1 IMPLANT
SUCTION FRAZIER HANDLE 10FR (MISCELLANEOUS) ×2
SUCTION TUBE FRAZIER 10FR DISP (MISCELLANEOUS) ×1 IMPLANT
SUT VICRYL RAPIDE 4-0 (SUTURE) IMPLANT
SUT VICRYL RAPIDE 4/0 PS 2 (SUTURE) ×3 IMPLANT
SYR 10ML LL (SYRINGE) IMPLANT
SYR BULB 3OZ (MISCELLANEOUS) IMPLANT
TOWEL GREEN STERILE FF (TOWEL DISPOSABLE) ×3 IMPLANT
TUBE CONNECTING 20'X1/4 (TUBING)
TUBE CONNECTING 20X1/4 (TUBING) IMPLANT
UNDERPAD 30X30 (UNDERPADS AND DIAPERS) ×3 IMPLANT

## 2019-06-13 NOTE — Anesthesia Postprocedure Evaluation (Signed)
Anesthesia Post Note  Patient: Alison Vega  Procedure(s) Performed: OPEN TREATMENT OF RIGHT 5TH METACARPAL FRACTURE (Right Hand)     Patient location during evaluation: PACU Anesthesia Type: Regional and MAC Level of consciousness: awake and alert Pain management: pain level controlled Vital Signs Assessment: post-procedure vital signs reviewed and stable Respiratory status: spontaneous breathing, nonlabored ventilation, respiratory function stable and patient connected to nasal cannula oxygen Cardiovascular status: stable and blood pressure returned to baseline Postop Assessment: no apparent nausea or vomiting Anesthetic complications: no    Last Vitals:  Vitals:   06/13/19 1015 06/13/19 1035  BP: (!) 144/79 119/72  Pulse: 64 61  Resp: 17 18  Temp:  36.8 C  SpO2: 97%     Last Pain:  Vitals:   06/13/19 1035  TempSrc: Oral  PainSc: 0-No pain                 Dyron Kawano S

## 2019-06-13 NOTE — Anesthesia Procedure Notes (Signed)
Anesthesia Regional Block: Supraclavicular block   Pre-Anesthetic Checklist: ,, timeout performed, Correct Patient, Correct Site, Correct Laterality, Correct Procedure, Correct Position, site marked, Risks and benefits discussed,  Surgical consent,  Pre-op evaluation,  At surgeon's request and post-op pain management  Laterality: Right  Prep: chloraprep       Needles:  Injection technique: Single-shot  Needle Type: Echogenic Stimulator Needle     Needle Length: 5cm  Needle Gauge: 22     Additional Needles:   Procedures:, nerve stimulator,,,,,,,   Nerve Stimulator or Paresthesia:  Response: biceps flexion, 0.45 mA,   Additional Responses:   Narrative:  Start time: 06/13/2019 8:20 AM End time: 06/13/2019 8:25 AM Injection made incrementally with aspirations every 5 mL.  Performed by: Personally  Anesthesiologist: Albertha Ghee, MD  Additional Notes: Functioning IV was confirmed and monitors were applied.  A 73mm 22ga Arrow echogenic stimulator needle was used. Sterile prep and drape,hand hygiene and sterile gloves were used.  Negative aspiration and negative test dose prior to incremental administration of local anesthetic. The patient tolerated the procedure well.  Ultrasound guidance: relevent anatomy identified, needle position confirmed, local anesthetic spread visualized around nerve(s), vascular puncture avoided.  Image printed for medical record.

## 2019-06-13 NOTE — Anesthesia Preprocedure Evaluation (Signed)
Anesthesia Evaluation  Patient identified by MRN, date of birth, ID band Patient awake    Reviewed: Allergy & Precautions, H&P , NPO status , Patient's Chart, lab work & pertinent test results  Airway Mallampati: II   Neck ROM: full    Dental   Pulmonary asthma ,    breath sounds clear to auscultation       Cardiovascular negative cardio ROS   Rhythm:regular Rate:Normal     Neuro/Psych    GI/Hepatic   Endo/Other    Renal/GU      Musculoskeletal   Abdominal   Peds  Hematology   Anesthesia Other Findings   Reproductive/Obstetrics                             Anesthesia Physical Anesthesia Plan  ASA: II  Anesthesia Plan: MAC and Regional   Post-op Pain Management:  Regional for Post-op pain   Induction: Intravenous  PONV Risk Score and Plan: 2 and Ondansetron, Propofol infusion, Treatment may vary due to age or medical condition and Midazolam  Airway Management Planned: Simple Face Mask  Additional Equipment:   Intra-op Plan:   Post-operative Plan:   Informed Consent: I have reviewed the patients History and Physical, chart, labs and discussed the procedure including the risks, benefits and alternatives for the proposed anesthesia with the patient or authorized representative who has indicated his/her understanding and acceptance.       Plan Discussed with: CRNA, Anesthesiologist and Surgeon  Anesthesia Plan Comments:         Anesthesia Quick Evaluation

## 2019-06-13 NOTE — Op Note (Addendum)
06/13/2019  8:35 AM  PATIENT:  Alison Vega  38 y.o. female  PRE-OPERATIVE DIAGNOSIS:  Displaced R 5th MC Fx  POST-OPERATIVE DIAGNOSIS:  Same  PROCEDURE:  ORIF displaced R 5th MC fx  SURGEON: Rayvon Char. Grandville Silos, MD  PHYSICIAN ASSISTANT: none  ANESTHESIA:  regional and MAC  SPECIMENS:  None  DRAINS:   None  EBL:  less than 50 mL  PREOPERATIVE INDICATIONS:  Alison Vega is a  38 y.o. female with a displaced R 5th MC neck fx  The risks benefits and alternatives were discussed with the patient preoperatively including but not limited to the risks of infection, bleeding, nerve injury, cardiopulmonary complications, the need for revision surgery, among others, and the patient verbalized understanding and consented to proceed.  OPERATIVE IMPLANTS: Biomet ALPS hand set small "T" plate/screws  OPERATIVE PROCEDURE:  After receiving prophylactic antibiotics and a regional block, the patient was escorted to the operative theatre and placed in a supine position.  A surgical "time-out" was performed during which the planned procedure, proposed operative site, and the correct patient identity were compared to the operative consent and agreement confirmed by the circulating nurse according to current facility policy.  Following application of a tourniquet to the operative extremity, the exposed skin was pre-scrubbed with Hibiclens scrub brush before being formally prepped with Chloraprep and draped in the usual sterile fashion.  The limb was exsanguinated with an Esmarch bandage and the tourniquet inflated to approximately 141mHg higher than systolic BP.  The MP joint was stiff, in extension, and I spent some time mobilizing it into flexion while stabilizing the fracture manually.  A linear longitudinal incision was fashioned over the mid to distal aspect of the fifth metacarpal, distally coursing radially to create a slight corner.  Skin flaps were elevated full-thickness with care to protect and  preserve the cutaneous nerve branches.  The distal extensor apparatus was found to have been perforated somewhat by the fracture, such that the EDQ's attachment was affected slightly, but it appeared as if the major extensor of the small finger MP joint was a [facility]ae [facility].  The distal extensor apparatus was split longitudinally on the dorsum, the periosteum incised and reflected both radially and ulnarly to allow visualization of the fracture.  The fracture was cleaned out of debris and manually reduced.  Reduction was confirmed fluoroscopically and then a small T plate applied.  He was secured first to the shaft, and then after reducing the fracture manually, distal screws were placed with care to ensure that they did not perforate the chondral surfaces into the MP joint.  Alignment was judged to be near-anatomic, without malrotation of the digit clinically.  The remainder the screw holes were then successively drilled and filled and final images obtained.  The wound was copiously irrigated and the periosteum reapproximated over the plate with 4-0 Vicryl Rapide interrupted and running suture.  The distal extensor apparatus was closed and repaired similarly, the tourniquet was released.  Some additional hemostasis was obtained with bipolar electrocautery and the skin was reapproximated with 4-0 Vicryl Rapide running horizontal mattress suture.  A short arm splint dressing was applied and she was taken to the recovery room in stable condition.  DISPOSITION: She will be discharged home today with typical instructions, return to the office in 10 to 15 days with new x-rays of the right hand out of splint and follow-on hand therapy appointment for custom protective hand-based splint fabrication and initiation of rehab.

## 2019-06-13 NOTE — Interval H&P Note (Signed)
History and Physical Interval Note:  06/13/2019 8:35 AM  Alison Vega  has presented today for surgery, with the diagnosis of RIGHT 5TH METACARPAL FRACTURE S62.306.  The various methods of treatment have been discussed with the patient and family. After consideration of risks, benefits and other options for treatment, the patient has consented to  Procedure(s): OPEN TREATMENT OF RIGHT 5TH METACARPAL FRACTURE (Right) as a surgical intervention.  The patient's history has been reviewed, patient examined, no change in status, stable for surgery.  I have reviewed the patient's chart and labs.  Questions were answered to the patient's satisfaction.     [name]

## 2019-06-13 NOTE — Transfer of Care (Signed)
Immediate Anesthesia Transfer of Care Note  Patient: Alison Vega  Procedure(s) Performed: OPEN TREATMENT OF RIGHT 5TH METACARPAL FRACTURE (Right Hand)  Patient Location: PACU  Anesthesia Type:MAC combined with regional for post-op pain  Level of Consciousness: awake, alert , oriented and patient cooperative  Airway & Oxygen Therapy: Patient Spontanous Breathing and Patient connected to face mask oxygen  Post-op Assessment: Report given to RN and Post -op Vital signs reviewed and stable  Post vital signs: Reviewed and stable  Last Vitals:  Vitals Value Taken Time  BP    Temp    Pulse 75 06/13/19 0944  Resp 18 06/13/19 0944  SpO2 100 % 06/13/19 0944  Vitals shown include unvalidated device data.  Last Pain:  Vitals:   06/13/19 0757  TempSrc: Oral  PainSc: 0-No pain         Complications: No apparent anesthesia complications

## 2019-06-13 NOTE — Anesthesia Procedure Notes (Signed)
Procedure Name: MAC Date/Time: 06/13/2019 8:54 AM Performed by: Signe Colt, CRNA Pre-anesthesia Checklist: Patient identified, Emergency Drugs available, Suction available, Patient being monitored and Timeout performed Patient Re-evaluated:Patient Re-evaluated prior to induction Oxygen Delivery Method: Simple face mask

## 2019-06-13 NOTE — Interval H&P Note (Signed)
History and Physical Interval Note:  06/13/2019 7:41 AM  Alison Vega  has presented today for surgery, with the diagnosis of RIGHT 5TH METACARPAL FRACTURE S62.306.  The various methods of treatment have been discussed with the patient and family. After consideration of risks, benefits and other options for treatment, the patient has consented to  Procedure(s): OPEN TREATMENT OF RIGHT 5TH METACARPAL FRACTURE (Right) as a surgical intervention.  The patient's history has been reviewed, patient examined, no change in status, stable for surgery.  I have reviewed the patient's chart and labs.  Questions were answered to the patient's satisfaction.     [name]

## 2019-06-13 NOTE — Progress Notes (Signed)
Assisted Dr. Hodierne with right, ultrasound guided, supraclavicular block. Side rails up, monitors on throughout procedure. See vital signs in flow sheet. Tolerated Procedure well.  

## 2019-06-13 NOTE — Discharge Instructions (Signed)
Discharge Instructions   You have a dressing with a plaster splint incorporated in it. Move your fingers as much as possible, making a full fist and fully opening the fist. Elevate your hand to reduce pain & swelling of the digits.  Ice over the operative site may be helpful to reduce pain & swelling.  DO NOT USE HEAT. Pain medicine has been prescribed for you.  Take Tylenol 650 mg and Ibuprofen 600 mg every 6 hours. Additionally, take Oxycodone for severe post operative pain as a rescue medicine. Leave the dressing in place until you return to our office.  You may shower, but keep the bandage clean & dry.  You may drive a car when you are off of prescription pain medications and can safely control your vehicle with both hands. Our office will call you to arrange follow-up  Please call [phone] during normal business hours or [phone] after hours for any problems. Including the following:  - excessive redness of the incisions - drainage for more than 4 days - fever of more than 101.5 F  *Please note that pain medications will not be refilled after hours or on weekends.  Work Status: NO WORK WITH RIGHT HAND UNTIL FIRST POST OPERATIVE APPOINTMENT.   Post Anesthesia Home Care Instructions  Activity: Get plenty of rest for the remainder of the day. A responsible individual must stay with you for 24 hours following the procedure.  For the next 24 hours, DO NOT: -Drive a car -Advertising copywriterperate machinery -Drink alcoholic beverages -Take any medication unless instructed by your physician -Make any legal decisions or sign important papers.  Meals: Start with liquid foods such as gelatin or soup. Progress to regular foods as tolerated. Avoid greasy, spicy, heavy foods. If nausea and/or vomiting occur, drink only clear liquids until the nausea and/or vomiting subsides. Call your physician if vomiting continues.  Special Instructions/Symptoms: Your throat may feel dry or sore from the  anesthesia or the breathing tube placed in your throat during surgery. If this causes discomfort, gargle with warm salt water. The discomfort should disappear within 24 hours.  If you had a scopolamine patch placed behind your ear for the management of post- operative nausea and/or vomiting:  1. The medication in the patch is effective for 72 hours, after which it should be removed.  Wrap patch in a tissue and discard in the trash. Wash hands thoroughly with soap and water. 2. You may remove the patch earlier than 72 hours if you experience unpleasant side effects which may include dry mouth, dizziness or visual disturbances. 3. Avoid touching the patch. Wash your hands with soap and water after contact with the patch.    Regional Anesthesia Blocks  1. Numbness or the inability to move the "blocked" extremity may last from 3-48 hours after placement. The length of time depends on the medication injected and your individual response to the medication. If the numbness is not going away after 48 hours, call your surgeon.  2. The extremity that is blocked will need to be protected until the numbness is gone and the  Strength has returned. Because you cannot feel it, you will need to take extra care to avoid injury. Because it may be weak, you may have difficulty moving it or using it. You may not know what position it is in without looking at it while the block is in effect.  3. For blocks in the legs and feet, returning to weight bearing and walking needs to be done  carefully. You will need to wait until the numbness is entirely gone and the strength has returned. You should be able to move your leg and foot normally before you try and bear weight or walk. You will need someone to be with you when you first try to ensure you do not fall and possibly risk injury.  4. Bruising and tenderness at the needle site are common side effects and will resolve in a few days.  5. Persistent numbness or new  problems with movement should be communicated to the surgeon or the [facility] [phone] [place] [phone]).

## 2019-06-14 ENCOUNTER — Encounter (HOSPITAL_BASED_OUTPATIENT_CLINIC_OR_DEPARTMENT_OTHER): Payer: Self-pay | Admitting: Orthopedic Surgery

## 2019-08-31 ENCOUNTER — Encounter (HOSPITAL_COMMUNITY): Payer: Self-pay

## 2019-08-31 ENCOUNTER — Ambulatory Visit (HOSPITAL_COMMUNITY)
Admission: EM | Admit: 2019-08-31 | Discharge: 2019-08-31 | Disposition: A | Discharge status: Home / Self Care | Payer: BC Managed Care – PPO | Attending: Family Medicine | Admitting: Family Medicine

## 2019-08-31 ENCOUNTER — Other Ambulatory Visit: Payer: Self-pay

## 2019-08-31 DIAGNOSIS — S80862A Insect bite (nonvenomous), left lower leg, initial encounter: Secondary | ICD-10-CM | POA: Diagnosis not present

## 2019-08-31 DIAGNOSIS — Z91038 Other insect allergy status: Secondary | ICD-10-CM | POA: Diagnosis not present

## 2019-08-31 DIAGNOSIS — R030 Elevated blood-pressure reading, without diagnosis of hypertension: Secondary | ICD-10-CM | POA: Diagnosis not present

## 2019-08-31 DIAGNOSIS — S80861A Insect bite (nonvenomous), right lower leg, initial encounter: Secondary | ICD-10-CM | POA: Diagnosis not present

## 2019-08-31 MED ORDER — PREDNISONE 10 MG (21) PO TBPK: ORAL_TABLET | Freq: Every day | ORAL | 0 refills | Status: DC

## 2019-08-31 NOTE — ED Provider Notes (Signed)
Alison Vega   [id number] 08/31/19 Arrival Time: 1308  ASSESSMENT & PLAN:  1. Allergy to ant bite   2. Elevated blood pressure reading without diagnosis of hypertension     To begin: Meds ordered this encounter  Medications  . predniSONE (STERAPRED UNI-PAK 21 TAB) 10 MG (21) TBPK tablet    Sig: Take by mouth daily. Take as directed.    Dispense:  21 tablet    Refill:  0    Benadryl if needed. Watch for s/s of infection. Will follow up with PCP or here if worsening or failing to improve as anticipated. May f/u here to recheck BP when feeling better.  Reviewed expectations re: course of current medical issues. Questions answered. Outlined signs and symptoms indicating need for more acute intervention. Patient verbalized understanding. After Visit Summary given.   SUBJECTIVE:  Alison Vega is a 38 y.o. female who presents with a skin complaint.   Location: lower legs; "bit by ants" Onset: abrupt Duration: 1 day now Associated pruritis? significant Associated pain? none Progression: stable  Drainage? none Recent travel? No  Other associated symptoms: none Therapies tried thus far: none Arthralgia or myalgia? none Recent illness? none Fever? none New medications? none No specific aggravating or alleviating factors reported.  Increased blood pressure noted today. Reports that she has not been treated for hypertension in the past.  She reports no chest pain on exertion, no dyspnea on exertion, no swelling of ankles, no orthostatic dizziness or lightheadedness, no orthopnea or paroxysmal nocturnal dyspnea, no palpitations and no intermittent claudication symptoms.  ROS: As per HPI. All other systems negative.   OBJECTIVE: Vitals:   08/31/19 1336  BP: (!) 126/97  Pulse: 83  Resp: 16  Temp: 98.4 F (36.9 C)  TempSrc: Oral  SpO2: 100%  Weight: 70.3 kg    General appearance: alert; no distress HEENT: ; AT Neck: supple with FROM Lungs: clear to  auscultation bilaterally Heart: regular rate and rhythm Abd: soft Extremities: no edema; moves all extremities normally Skin: warm and dry; signs of infection: no; scattered small approx 3 mm erythematous papules over bilateral LE; no areas of fluctuance; no drainage Psychological: alert and cooperative; normal mood and affect  Allergies  Allergen Reactions  . Penicillins Other (See Comments)    Shut patients throat.  . Sulfa Antibiotics Other (See Comments)    Shuts patient's throat.    Past Medical History:  Diagnosis Date  . Asthma   . Cellulitis    cellulitis/abcess x15, "every 2 months"   Social History   Socioeconomic History  . Marital status: Single    Spouse name: Not on file  . Number of children: Not on file  . Years of education: Not on file  . Highest education level: Not on file  Occupational History  . Not on file  Social Needs  . Financial resource strain: Not on file  . Food insecurity    Worry: Not on file    Inability: Not on file  . Transportation needs    Medical: Not on file    Non-medical: Not on file  Tobacco Use  . Smoking status: Never Smoker  . Smokeless tobacco: Never Used  Substance and Sexual Activity  . Alcohol use: No  . Drug use: No  . Sexual activity: Yes    Birth control/protection: Surgical  Lifestyle  . Physical activity    Days per week: Not on file    Minutes per session: Not on file  . Stress:  Not on file  Relationships  . Social Musician on phone: Not on file    Gets together: Not on file    Attends religious service: Not on file    Active member of club or organization: Not on file    Attends meetings of clubs or organizations: Not on file    Relationship status: Not on file  . Intimate partner violence    Fear of current or ex partner: Not on file    Emotionally abused: Not on file    Physically abused: Not on file    Forced sexual activity: Not on file  Other Topics Concern  . Not on file   Social History Narrative  . Not on file   History reviewed. No pertinent family history. Past Surgical History:  Procedure Laterality Date  . CESAREAN SECTION    . EYE SURGERY    . OPEN REDUCTION INTERNAL FIXATION (ORIF) METACARPAL Right 06/13/2019   Procedure: OPEN TREATMENT OF RIGHT 5TH METACARPAL FRACTURE;  Surgeon: Mack Hook, MD;  Location: [facility];  Service: Orthopedics;  Laterality: Right;  . TUBAL LIGATION       Mardella Layman, MD 09/01/19 [phone]

## 2019-08-31 NOTE — ED Triage Notes (Signed)
Pt states she has been bitten by some ants. Pt states she itches all over. This happened yesterday.

## 2019-09-04 ENCOUNTER — Other Ambulatory Visit: Payer: Self-pay

## 2019-09-04 ENCOUNTER — Emergency Department (HOSPITAL_COMMUNITY)
Admission: EM | Admit: 2019-09-04 | Discharge: 2019-09-04 | Disposition: A | Discharge status: Home / Self Care | Payer: BC Managed Care – PPO | Attending: Emergency Medicine | Admitting: Emergency Medicine

## 2019-09-04 DIAGNOSIS — L509 Urticaria, unspecified: Secondary | ICD-10-CM | POA: Insufficient documentation

## 2019-09-04 MED ORDER — CETIRIZINE HCL 10 MG PO TABS: 10.0000 mg | ORAL_TABLET | Freq: Every day | ORAL | 0 refills | Status: DC

## 2019-09-04 MED ORDER — DIPHENHYDRAMINE HCL 25 MG PO TABS: 25.0000 mg | ORAL_TABLET | Freq: Four times a day (QID) | ORAL | 0 refills | Status: DC | PRN

## 2019-09-04 MED ORDER — EPINEPHRINE 0.3 MG/0.3ML IJ SOAJ: 0.3000 mg | INTRAMUSCULAR | 2 refills | Status: DC | PRN

## 2019-09-04 NOTE — ED Triage Notes (Addendum)
Patient reports that she was bitten by some ants last Tuesday, she went to urgent care and was given Prednisone She reports continuous periodic swelling She says her around her right eye and arms began to swell this afternoon  No difficulty breathing Air intact

## 2019-09-04 NOTE — ED Provider Notes (Signed)
[facility] EMERGENCY DEPARTMENT Provider Note   CSN: [id number] Arrival date & time: 09/04/19  [place]     History   Chief Complaint Chief Complaint  Patient presents with  . Allergic Reaction    HPI Alison Vega is a 38 y.o. female presenting the emergency department with concerns for ongoing hives.  Patient reports that she had spontaneous onset of diffuse body hives approximately a week and a half ago.  She reports hives popping up all over her body at different points.  She denies ever having throat swelling or tightness.  She states that 5 days ago (a few days after hives began) she was walking outside and stepped on an ant hill.  She got bit multiple times by ants.  She believes that this worsened her hives.  She went to an urgent care where she was given prednisone.  She has been taking her Medrol Dosepak and is down to her last day.  She believes her hives are not getting any better.  They occur spontaneously and appeared different parts of her body.  Denies any throat swelling or pain.  She denies any new environmental exposures, detergents, skin cream products, or pets in the house  She has never had issues with these kind of allergies before.  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 Asthma   . Cellulitis    cellulitis/abcess x15, "every 2 months"    There are no active problems to display for this patient.   Past Surgical History:  Procedure Laterality Date  . CESAREAN SECTION    . EYE SURGERY    . OPEN REDUCTION INTERNAL FIXATION (ORIF) METACARPAL Right 06/13/2019   Procedure: OPEN TREATMENT OF RIGHT 5TH METACARPAL FRACTURE;  Surgeon: Milly Jakob, MD;  Location: [facility];  Service: Orthopedics;  Laterality: Right;  . TUBAL LIGATION       OB History   No obstetric history on file.      Home  Medications    Prior to Admission medications   Medication Sig Start Date End Date Taking? Authorizing Provider  predniSONE (STERAPRED UNI-PAK 21 TAB) 10 MG (21) TBPK tablet Take by mouth daily. Take as directed. 08/31/19  Yes Vanessa Kick, MD  acetaminophen (TYLENOL) 325 MG tablet Take 2 tablets (650 mg total) by mouth every 6 (six) hours. Patient not taking: Reported on 09/04/2019 06/13/19   Milly Jakob, MD  cetirizine (ZYRTEC ALLERGY) 10 MG tablet Take 1 tablet (10 mg total) by mouth daily for 30 doses. Daytime (Nondrowsy) antihistamine 09/04/19 10/04/19  Wyvonnia Dusky, MD  diphenhydrAMINE (BENADRYL) 25 MG tablet Take 1 tablet (25 mg total) by mouth every 6 (six) hours as needed for up to 30 doses for itching or allergies. 09/04/19   Wyvonnia Dusky, MD  EPINEPHrine 0.3 mg/0.3 mL IJ SOAJ injection Inject 0.3 mLs (0.3 mg total) into the muscle as needed for up to 2 doses for anaphylaxis. 09/04/19   Wyvonnia Dusky, MD  ibuprofen (ADVIL) 200 MG tablet Take 3 tablets (600 mg total) by mouth every 6 (six) hours. Patient not taking: Reported on 09/04/2019 06/13/19   Milly Jakob, MD  ibuprofen (ADVIL) 600 MG tablet Take 1 tablet (600 mg total) by mouth every 6 (six) hours as needed. Patient not taking: Reported on 09/04/2019 06/03/19   Rodell Perna A, PA-C  oxyCODONE (ROXICODONE) 5  MG immediate release tablet Take 1 tablet (5 mg total) by mouth every 6 (six) hours as needed for severe pain. Patient not taking: Reported on 09/04/2019 06/13/19   Milly Jakob, MD    Family History No family history on file.  Social History Social History   Tobacco Use  . Smoking status: Never Smoker  . Smokeless tobacco: Never Used  Substance Use Topics  . Alcohol use: No  . Drug use: No     Allergies   Penicillins, Sulfa antibiotics, and Avocado   Review of Systems Review of Systems  Constitutional: Negative for chills and fever.  HENT: Positive for facial swelling. Negative for sore throat,  trouble swallowing and voice change.   Eyes: Positive for itching. Negative for photophobia and visual disturbance.  Respiratory: Negative for cough, choking, chest tightness, shortness of breath and wheezing.   Cardiovascular: Negative for chest pain and palpitations.  Gastrointestinal: Negative for abdominal pain, nausea and vomiting.  Genitourinary: Negative for dysuria.  Musculoskeletal: Negative for arthralgias and back pain.  Skin: Positive for color change, rash and wound.  Neurological: Negative for syncope and light-headedness.  All other systems reviewed and are negative.    Physical Exam Updated Vital Signs BP (!) 138/95   Pulse 79   Temp 98.6 F (37 C) (Oral)   SpO2 99%   Physical Exam Vitals signs and nursing note reviewed.  Constitutional:      General: She is not in acute distress.    Appearance: She is well-developed.  HENT:     Head: Normocephalic and atraumatic.  Eyes:     Conjunctiva/sclera: Conjunctivae normal.  Neck:     Musculoskeletal: Neck supple.  Cardiovascular:     Rate and Rhythm: Normal rate and regular rhythm.     Pulses: Normal pulses.     Heart sounds: No murmur.  Pulmonary:     Effort: Pulmonary effort is normal. No respiratory distress.     Breath sounds: Normal breath sounds. No wheezing or rales.  Abdominal:     Palpations: Abdomen is soft.     Tenderness: There is no abdominal tenderness.  Skin:    General: Skin is warm and dry.     Comments: No visible hives during my exam Patient reporting "swelling up" of left foot, no visible lesion or swelling  Neurological:     Mental Status: She is alert.      ED Treatments / Results  Labs (all labs ordered are listed, but only abnormal results are displayed) Labs Reviewed - No data to display  EKG None  Radiology No results found.  Procedures Procedures (including critical care time)  Medications Ordered in ED Medications - No data to display   Initial Impression /  Assessment and Plan / ED Course  I have reviewed the triage vital signs and the nursing notes.  Pertinent labs & imaging results that were available during my care of the patient were reviewed by me and considered in my medical decision making (see chart for details).  38 yo female presenting with 1.5 weeks of urticaria, with spontaneous onset.  Patient was seen in the urgent care several days ago (a few day after the onset of her hives), after having been stung by some ants while walking outside.  She was stared on prednisone medrol dose pack which she is completing.  She has not taken benadryl or antihistamines.  She reports continued urticaria and hives appearing at random all over her body, which are transient and fleeting.  She showed me a photo of these on her phone, and they do appear to be hives, and are not consistent with erythema multiforme  There is no reported respiratory or throat involvement or GI involvement to suspect anaphylaxis.  I will prescribe her an epi-pen for home, however, given that she does report anaphylactic reaction with airway swelling to certain medications, and does not have epi pens at home.  I also recommended starting antihistamines and scheduling an appointment at the allergy clinic.  Otherwise she has no reported exposures to new detergents, skin products, pets, or new environments, to pinpoint a clear source of what may be triggering her reaction.     Final Clinical Impressions(s) / ED Diagnoses   Final diagnoses:  Urticaria  Hives    ED Discharge Orders         Ordered    diphenhydrAMINE (BENADRYL) 25 MG tablet  Every 6 hours PRN     09/04/19 2146    cetirizine (ZYRTEC ALLERGY) 10 MG tablet  Daily     09/04/19 2146    EPINEPHrine 0.3 mg/0.3 mL IJ SOAJ injection  As needed    Note to Pharmacy: Adult epi-pen, 2 doses   09/04/19 2146           Wyvonnia Dusky, MD 09/05/19 1126

## 2019-09-04 NOTE — ED Notes (Signed)
ED Provider at bedside. 

## 2019-09-13 ENCOUNTER — Ambulatory Visit (INDEPENDENT_AMBULATORY_CARE_PROVIDER_SITE_OTHER): Payer: BC Managed Care – PPO | Admitting: Allergy and Immunology

## 2019-09-13 ENCOUNTER — Encounter: Payer: Self-pay | Admitting: Allergy and Immunology

## 2019-09-13 ENCOUNTER — Other Ambulatory Visit: Payer: Self-pay

## 2019-09-13 VITALS — BP 118/70 | HR 95 | Temp 98.2°F | Resp 18 | Ht 59.75 in | Wt 162.5 lb

## 2019-09-13 DIAGNOSIS — J4530 Mild persistent asthma, uncomplicated: Secondary | ICD-10-CM | POA: Insufficient documentation

## 2019-09-13 DIAGNOSIS — T783XXA Angioneurotic edema, initial encounter: Secondary | ICD-10-CM | POA: Insufficient documentation

## 2019-09-13 DIAGNOSIS — J310 Chronic rhinitis: Secondary | ICD-10-CM

## 2019-09-13 DIAGNOSIS — T783XXD Angioneurotic edema, subsequent encounter: Secondary | ICD-10-CM

## 2019-09-13 DIAGNOSIS — L500 Allergic urticaria: Secondary | ICD-10-CM | POA: Insufficient documentation

## 2019-09-13 DIAGNOSIS — J4520 Mild intermittent asthma, uncomplicated: Secondary | ICD-10-CM | POA: Diagnosis not present

## 2019-09-13 HISTORY — DX: Chronic rhinitis: J31.0

## 2019-09-13 MED ORDER — ALBUTEROL SULFATE HFA 108 (90 BASE) MCG/ACT IN AERS: INHALATION_SPRAY | RESPIRATORY_TRACT | 2 refills | Status: DC

## 2019-09-13 MED ORDER — FAMOTIDINE 20 MG PO TABS: 20.0000 mg | ORAL_TABLET | Freq: Two times a day (BID) | ORAL | 5 refills | Status: DC | PRN

## 2019-09-13 MED ORDER — FLUTICASONE PROPIONATE 50 MCG/ACT NA SUSP: 2.0000 | Freq: Every day | NASAL | 5 refills | Status: DC | PRN

## 2019-09-13 MED ORDER — LEVOCETIRIZINE DIHYDROCHLORIDE 5 MG PO TABS: 5.0000 mg | ORAL_TABLET | Freq: Every day | ORAL | 5 refills | Status: DC | PRN

## 2019-09-13 NOTE — Progress Notes (Signed)
New Patient Note  RE: Alison Vega MRN: [id number] DOB: 15-Sep-1981 Date of Office Visit: 09/13/2019  Referring provider: No ref. provider found Primary care provider: Patient, No Pcp Per  Chief Complaint: Urticaria   History of present illness: Alison Vega is a 38 y.o. female presenting today for evaluation of hives.  Over the past 2 weeks, Alison Vega has experienced recurrent episodes of hives. The hives have appeared at different times over her entire body.  The lesions are described as erythematous, raised, and pruritic.  Individual hives last less than 24 hours without leaving residual pigmentation or bruising. She has experienced associated angioedema of the eyelids but denies concomitant cardiopulmonary or GI symptoms. She has not experienced unexpected weight loss, recurrent fevers or drenching night sweats.  She underwent internal fixation of the right hand on June 13, 2019.  She has had occasional redness of right hand but mild and infrequently, and the redness does not seem to correlate with the generalized urticaria. Also, she has been under a lot of stress recently. Otherwise, no specific medication, food, skin care product, detergent, soap, or other environmental triggers have been identified. The symptoms do not seem to correlate with NSAIDs use. She did not have symptoms consistent with a respiratory tract infection at the time of symptom onset. Alison Vega has tried to control symptoms with systemic steroids and OTC antihistamines which have offered adequate temporary relief. She has been evaluated and treated in the emergency department for these symptoms. Skin biopsy has not been performed. Recently, she has been experiencing hives daily. She experiences nasal congestion, rhinorrhea, sneezing, and postnasal drainage.  She believes that the symptoms may be more pronounced with pollen exposure.  Otherwise, no specific environmental triggers been identified. She reports that she has had  asthma since childhood.  She currently does not have an albuterol inhaler because she was unable to afford one before acquiring insurance.  She reports that she typically experiences asthma symptoms 1-2 times per week on average and does not experience limitations in normal daily activities or nocturnal awakenings due to lower respiratory symptoms.  She reports that her asthma is typically triggered by exercise and upper respiratory tract infections.  Assessment and plan: Recurrent urticaria Unclear etiology. Skin tests to select food allergens were negative today. NSAIDs and emotional stress commonly exacerbate urticaria but are not the underlying etiology in this case. Physical urticarias are negative by history (i.e. pressure-induced, temperature, vibration, solar, etc.). History and lesions are not consistent with urticaria pigmentosa so I am not suspicious for mastocytosis. There are no concomitant symptoms concerning for anaphylaxis or constitutional symptoms worrisome for an underlying malignancy.   We will not order labs at this time, however, if lesions recur beyond 6 weeks or change in character, we will assess potential etiologies with screening labs.  For symptom relief, patient is to take oral antihistamines as directed.  Instructions have been discussed and provided for H1/H2 receptor blockade with titration to find lowest effective dose.  A prescription has been provided for levocetirizine(Xyzal), 5 mg daily as needed.  A prescription has been provided for famotidine (Pepcid) 20 mg twice daily as needed.  Prednisone has been provided: 10 mg/day x 7 days.  Should symptoms recur, a journal is to be kept recording any foods eaten, beverages consumed, medications taken within a 6 hour period prior to the onset of symptoms, as well as record activities being performed, and environmental conditions. For any symptoms concerning for anaphylaxis, 911 is to be called  immediately.  Angioedema Associated angioedema occurs in up to 50% of patients with chronic urticaria.  Treatment/diagnostic plan as outlined above.  Mild intermittent asthma  A prescription has been provided for albuterol HFA, 1 to 2 inhalations every 4-6 hours as needed and 15 minutes prior to exercise/exertion.  Subjective and objective measures of pulmonary function will be followed and the treatment plan will be adjusted accordingly.  Chronic rhinitis  A prescription has been provided for fluticasone nasal spray, 2 sprays per nostril daily as needed. Proper nasal spray technique has been discussed and demonstrated.  Nasal saline spray (i.e., Simply Saline) or nasal saline lavage (i.e., NeilMed) is recommended as needed and prior to medicated nasal sprays.   Meds ordered this encounter  Medications   levocetirizine (XYZAL) 5 MG tablet    Sig: Take 1 tablet (5 mg total) by mouth daily as needed for allergies.    Dispense:  30 tablet    Refill:  5   famotidine (PEPCID) 20 MG tablet    Sig: Take 1 tablet (20 mg total) by mouth 2 (two) times daily as needed for heartburn or indigestion.    Dispense:  60 tablet    Refill:  5   albuterol (VENTOLIN HFA) 108 (90 Base) MCG/ACT inhaler    Sig: 1-2 puffs every 4-6 hours as needed and 15 minutes prior to exercise.    Dispense:  18 g    Refill:  2   fluticasone (FLONASE) 50 MCG/ACT nasal spray    Sig: Place 2 sprays into both nostrils daily as needed for allergies or rhinitis.    Dispense:  16 g    Refill:  5    Diagnostics: Spirometry:  Normal with an FEV1 of 104% predicted. This study was performed while the patient was asymptomatic.  Please see scanned spirometry results for details. Environmental skin testing: Negative despite a positive histamine control. Food allergen skin testing: Negative despite a positive histamine control.    Physical examination: Blood pressure 118/70, pulse 95, temperature 98.2 F (36.8 C),  temperature source Temporal, resp. rate 18, height 4' 11.75" (1.518 m), weight 162 lb 8 oz (73.7 kg), SpO2 97 %.  General: Alert, interactive, in no acute distress. HEENT: TMs pearly gray, turbinates moderately edematous without discharge, post-pharynx moderately erythematous. Neck: Supple without lymphadenopathy. Lungs: Clear to auscultation without wheezing, rhonchi or rales. CV: Normal S1, S2 without murmurs. Abdomen: Nondistended, nontender. Skin: Warm and dry, without lesions or rashes. Extremities:  No clubbing, cyanosis or edema. Neuro:   Grossly intact.  Review of systems:  Review of systems negative except as noted in HPI / PMHx or noted below: Review of Systems  Constitutional: Negative.   HENT: Negative.   Eyes: Negative.   Respiratory: Negative.   Cardiovascular: Negative.   Gastrointestinal: Negative.   Genitourinary: Negative.   Musculoskeletal: Negative.   Skin: Negative.   Neurological: Negative.   Endo/Heme/Allergies: Negative.   Psychiatric/Behavioral: Negative.     Past medical history:  Past Medical History:  Diagnosis Date   Asthma    Cellulitis    cellulitis/abcess x15, "every 2 months"    Past surgical history:  Past Surgical History:  Procedure Laterality Date   CESAREAN SECTION     EYE SURGERY     OPEN REDUCTION INTERNAL FIXATION (ORIF) METACARPAL Right 06/13/2019   Procedure: OPEN TREATMENT OF RIGHT 5TH METACARPAL FRACTURE;  Surgeon: Mack Hookhompson, David, MD;  Location: [facility];  Service: Orthopedics;  Laterality: Right;   TUBAL LIGATION  Family history: History reviewed. No pertinent family history.  Social history: Social History   Socioeconomic History   Marital status: Single    Spouse name: Not on file   Number of children: Not on file   Years of education: Not on file   Highest education level: Not on file  Occupational History   Not on file  Social Needs   Financial resource strain: Not on file    Food insecurity    Worry: Not on file    Inability: Not on file   Transportation needs    Medical: Not on file    Non-medical: Not on file  Tobacco Use   Smoking status: Never Smoker   Smokeless tobacco: Never Used  Substance and Sexual Activity   Alcohol use: No   Drug use: No   Sexual activity: Yes    Birth control/protection: Surgical  Lifestyle   Physical activity    Days per week: Not on file    Minutes per session: Not on file   Stress: Not on file  Relationships   Social connections    Talks on phone: Not on file    Gets together: Not on file    Attends religious service: Not on file    Active member of club or organization: Not on file    Attends meetings of clubs or organizations: Not on file    Relationship status: Not on file   Intimate partner violence    Fear of current or ex partner: Not on file    Emotionally abused: Not on file    Physically abused: Not on file    Forced sexual activity: Not on file  Other Topics Concern   Not on file  Social History Narrative   Not on file    Environmental History: Patient lives in a 36 year old house with carpeting in the bedroom and central air/heat.  There are dogs in the home which do not have access to her bedroom.  She is a non-smoker.  Current Outpatient Medications  Medication Sig Dispense Refill   cetirizine (ZYRTEC ALLERGY) 10 MG tablet Take 1 tablet (10 mg total) by mouth daily for 30 doses. Daytime (Nondrowsy) antihistamine 30 tablet 0   diphenhydrAMINE (BENADRYL) 25 MG tablet Take 1 tablet (25 mg total) by mouth every 6 (six) hours as needed for up to 30 doses for itching or allergies. 30 tablet 0   EPINEPHrine 0.3 mg/0.3 mL IJ SOAJ injection Inject 0.3 mLs (0.3 mg total) into the muscle as needed for up to 2 doses for anaphylaxis. 1 each 2   acetaminophen (TYLENOL) 325 MG tablet Take 2 tablets (650 mg total) by mouth every 6 (six) hours. (Patient not taking: Reported on 09/04/2019)      albuterol (VENTOLIN HFA) 108 (90 Base) MCG/ACT inhaler 1-2 puffs every 4-6 hours as needed and 15 minutes prior to exercise. 18 g 2   famotidine (PEPCID) 20 MG tablet Take 1 tablet (20 mg total) by mouth 2 (two) times daily as needed for heartburn or indigestion. 60 tablet 5   fluticasone (FLONASE) 50 MCG/ACT nasal spray Place 2 sprays into both nostrils daily as needed for allergies or rhinitis. 16 g 5   ibuprofen (ADVIL) 200 MG tablet Take 3 tablets (600 mg total) by mouth every 6 (six) hours. (Patient not taking: Reported on 09/04/2019)     ibuprofen (ADVIL) 600 MG tablet Take 1 tablet (600 mg total) by mouth every 6 (six) hours as needed. (Patient not taking: Reported  on 09/04/2019) 30 tablet 0   levocetirizine (XYZAL) 5 MG tablet Take 1 tablet (5 mg total) by mouth daily as needed for allergies. 30 tablet 5   oxyCODONE (ROXICODONE) 5 MG immediate release tablet Take 1 tablet (5 mg total) by mouth every 6 (six) hours as needed for severe pain. (Patient not taking: Reported on 09/04/2019) 20 tablet 0   predniSONE (STERAPRED UNI-PAK 21 TAB) 10 MG (21) TBPK tablet Take by mouth daily. Take as directed. (Patient not taking: Reported on 09/13/2019) 21 tablet 0   No current facility-administered medications for this visit.     Known medication allergies: Allergies  Allergen Reactions   Penicillins Shortness Of Breath and Other (See Comments)    Shut patients throat.   Sulfa Antibiotics Shortness Of Breath and Other (See Comments)    Shuts patient's throat.   Avocado Hives    I appreciate the opportunity to take part in [facility] care. Please do not hesitate to contact me with questions.  Sincerely,   R. Jorene Guest, MD

## 2019-09-13 NOTE — Patient Instructions (Addendum)
Recurrent urticaria Unclear etiology. Skin tests to select food allergens were negative today. NSAIDs and emotional stress commonly exacerbate urticaria but are not the underlying etiology in this case. Physical urticarias are negative by history (i.e. pressure-induced, temperature, vibration, solar, etc.). History and lesions are not consistent with urticaria pigmentosa so I am not suspicious for mastocytosis. There are no concomitant symptoms concerning for anaphylaxis or constitutional symptoms worrisome for an underlying malignancy.   We will not order labs at this time, however, if lesions recur beyond 6 weeks or change in character, we will assess potential etiologies with screening labs.  For symptom relief, patient is to take oral antihistamines as directed.  Instructions have been discussed and provided for H1/H2 receptor blockade with titration to find lowest effective dose.  A prescription has been provided for levocetirizine(Xyzal), 5 mg daily as needed.  A prescription has been provided for famotidine (Pepcid) 20 mg twice daily as needed.  Prednisone has been provided: 10 mg/day x 7 days.  Should symptoms recur, a journal is to be kept recording any foods eaten, beverages consumed, medications taken within a 6 hour period prior to the onset of symptoms, as well as record activities being performed, and environmental conditions. For any symptoms concerning for anaphylaxis, 911 is to be called immediately.  Angioedema Associated angioedema occurs in up to 50% of patients with chronic urticaria.  Treatment/diagnostic plan as outlined above.  Mild intermittent asthma  A prescription has been provided for albuterol HFA, 1 to 2 inhalations every 4-6 hours as needed and 15 minutes prior to exercise/exertion.  Subjective and objective measures of pulmonary function will be followed and the treatment plan will be adjusted accordingly.  Chronic rhinitis  A prescription has been  provided for fluticasone nasal spray, 2 sprays per nostril daily as needed. Proper nasal spray technique has been discussed and demonstrated.  Nasal saline spray (i.e., Simply Saline) or nasal saline lavage (i.e., NeilMed) is recommended as needed and prior to medicated nasal sprays.   Return in about 6 weeks (around 10/25/2019).   Urticaria (Hives)  . Levocetirizine (Xyzal) 5 mg twice a day and famotidine (Pepcid) 20 mg twice a day. If no symptoms for 7-14 days then decrease to. . Levocetirizine (Xyzal) 5 mg twice a day and famotidine (Pepcid) 20 mg once a day.  If no symptoms for 7-14 days then decrease to. . Levocetirizine (Xyzal) 5 mg twice a day.  If no symptoms for 7-14 days then decrease to. . Levocetirizine (Xyzal) 5 mg once a day.  May use Benadryl (diphenhydramine) as needed for breakthrough symptoms       If symptoms return, then step up dosage

## 2019-09-13 NOTE — Assessment & Plan Note (Signed)
Associated angioedema occurs in up to 50% of patients with chronic urticaria.  Treatment/diagnostic plan as outlined above. 

## 2019-09-13 NOTE — Assessment & Plan Note (Signed)
Unclear etiology. Skin tests to select food allergens were negative today. NSAIDs and emotional stress commonly exacerbate urticaria but are not the underlying etiology in this case. Physical urticarias are negative by history (i.e. pressure-induced, temperature, vibration, solar, etc.). History and lesions are not consistent with urticaria pigmentosa so I am not suspicious for mastocytosis. There are no concomitant symptoms concerning for anaphylaxis or constitutional symptoms worrisome for an underlying malignancy.   We will not order labs at this time, however, if lesions recur beyond 6 weeks or change in character, we will assess potential etiologies with screening labs.  For symptom relief, patient is to take oral antihistamines as directed.  Instructions have been discussed and provided for H1/H2 receptor blockade with titration to find lowest effective dose.  A prescription has been provided for levocetirizine(Xyzal), 5 mg daily as needed.  A prescription has been provided for famotidine (Pepcid) 20 mg twice daily as needed.  Prednisone has been provided: 10 mg/day x 7 days.  Should symptoms recur, a journal is to be kept recording any foods eaten, beverages consumed, medications taken within a 6 hour period prior to the onset of symptoms, as well as record activities being performed, and environmental conditions. For any symptoms concerning for anaphylaxis, 911 is to be called immediately.

## 2019-09-13 NOTE — Assessment & Plan Note (Signed)
   A prescription has been provided for albuterol HFA, 1 to 2 inhalations every 4-6 hours as needed and 15 minutes prior to exercise/exertion.  Subjective and objective measures of pulmonary function will be followed and the treatment plan will be adjusted accordingly.

## 2019-09-13 NOTE — Assessment & Plan Note (Signed)
   A prescription has been provided for fluticasone nasal spray, 2 sprays per nostril daily as needed. Proper nasal spray technique has been discussed and demonstrated.  Nasal saline spray (i.e., Simply Saline) or nasal saline lavage (i.e., NeilMed) is recommended as needed and prior to medicated nasal sprays.

## 2019-11-15 ENCOUNTER — Ambulatory Visit (INDEPENDENT_AMBULATORY_CARE_PROVIDER_SITE_OTHER): Payer: BC Managed Care – PPO | Admitting: Allergy and Immunology

## 2019-11-15 ENCOUNTER — Encounter: Payer: Self-pay | Admitting: Allergy and Immunology

## 2019-11-15 ENCOUNTER — Other Ambulatory Visit: Payer: Self-pay

## 2019-11-15 VITALS — BP 136/78 | HR 86 | Temp 98.1°F | Resp 16

## 2019-11-15 DIAGNOSIS — L500 Allergic urticaria: Secondary | ICD-10-CM

## 2019-11-15 DIAGNOSIS — J4530 Mild persistent asthma, uncomplicated: Secondary | ICD-10-CM

## 2019-11-15 DIAGNOSIS — K2970 Gastritis, unspecified, without bleeding: Secondary | ICD-10-CM

## 2019-11-15 DIAGNOSIS — J310 Chronic rhinitis: Secondary | ICD-10-CM | POA: Diagnosis not present

## 2019-11-15 MED ORDER — FLOVENT HFA 110 MCG/ACT IN AERO: 2.0000 | INHALATION_SPRAY | Freq: Two times a day (BID) | RESPIRATORY_TRACT | 5 refills | Status: DC

## 2019-11-15 MED ORDER — FLUTICASONE PROPIONATE 50 MCG/ACT NA SUSP: 2.0000 | Freq: Every day | NASAL | 5 refills | Status: DC | PRN

## 2019-11-15 NOTE — Assessment & Plan Note (Addendum)
   A prescription has been provided for Flovent (fluticasone) 110 g, 2 inhalations twice a day. To maximize pulmonary deposition, a spacer has been provided along with instructions for its proper administration with an HFA inhaler.  Continue albuterol HFA, 1 to 2 inhalations every 4-6 hours if needed.  Subjective and objective measures of pulmonary function will be followed and the treatment plan will be adjusted accordingly.  If there continues to be clear symptom correlation with the metal dust exposure at her place of work, she may need to consider employment that does not require exposure to metal dust.

## 2019-11-15 NOTE — Assessment & Plan Note (Signed)
Given the persistence of urticaria for more than 8 weeks, we will proceed to laboratory evaluation.  The following labs have been ordered: FCeRI antibody, anti-thyroglobulin antibody, thyroid peroxidase antibody, tryptase, H. pylori serology, CBC, CMP, ESR, ANA, and alpha-gal panel.  The patient will be called with further recommendations after lab results have returned.  Instructions have been discussed and provided for H1/H2 receptor blockade with titration to find lowest effective dose.  Should there be a significant increase or change in symptoms, a journal is to be kept recording any foods eaten, beverages consumed, medications taken within a 6 hour period prior to the onset of symptoms, as well as record activities being performed, and environmental conditions. For any symptoms concerning for anaphylaxis, 911 is to be called immediately.

## 2019-11-15 NOTE — Patient Instructions (Addendum)
Recurrent urticaria Given the persistence of urticaria for more than 8 weeks, we will proceed to laboratory evaluation.  The following labs have been ordered: FCeRI antibody, anti-thyroglobulin antibody, thyroid peroxidase antibody, tryptase, H. pylori serology, CBC, CMP, ESR, ANA, and alpha-gal panel.  The patient will be called with further recommendations after lab results have returned.  Instructions have been discussed and provided for H1/H2 receptor blockade with titration to find lowest effective dose.  Should there be a significant increase or change in symptoms, a journal is to be kept recording any foods eaten, beverages consumed, medications taken within a 6 hour period prior to the onset of symptoms, as well as record activities being performed, and environmental conditions. For any symptoms concerning for anaphylaxis, 911 is to be called immediately.  Chronic rhinitis  Continue fluticasone nasal spray, 2 sprays per nostril daily as needed.  Nasal saline lavage (NeilMed) has been recommended as needed and prior to medicated nasal sprays along with instructions for proper administration.  Mild persistent asthma  A prescription has been provided for Flovent (fluticasone) 110 g, 2 inhalations twice a day. To maximize pulmonary deposition, a spacer has been provided along with instructions for its proper administration with an HFA inhaler.  Continue albuterol HFA, 1 to 2 inhalations every 4-6 hours if needed.  Subjective and objective measures of pulmonary function will be followed and the treatment plan will be adjusted accordingly.  If there continues to be clear symptom correlation with the metal dust exposure at her place of work, she may need to consider employment that does not require exposure to metal dust.   Patient will be called when lab results have returned.  Follow-up in 3 months or sooner if needed.  Urticaria (Hives)  . Levocetirizine (Xyzal) 5 mg twice a  day and famotidine (Pepcid) 20 mg twice a day. If no symptoms for 7-14 days then decrease to. . Levocetirizine (Xyzal) 5 mg twice a day and famotidine (Pepcid) 20 mg once a day.  If no symptoms for 7-14 days then decrease to. . Levocetirizine (Xyzal) 5 mg twice a day.  If no symptoms for 7-14 days then decrease to. . Levocetirizine (Xyzal) 5 mg once a day.  May use Benadryl (diphenhydramine) as needed for breakthrough symptoms       If symptoms return, then step up dosage

## 2019-11-15 NOTE — Progress Notes (Signed)
Follow-up Note  RE: Alison Vega MRN: [id number] DOB: 05/27/1981 Date of Office Visit: 11/15/2019  Primary care provider: Patient, No Pcp Per Referring provider: No ref. provider found  History of present illness: Alison Vega is a 39 y.o. female with recurrent urticaria/angioedema, intermittent asthma, and chronic rhinitis presenting today for follow-up.  She was previously seen in this clinic for her initial evaluation on September 13, 2019.  She reports that in the interval since her previous visit the recurrence of hives has persisted, particularly on her back, hips, lower extremities, abdomen, and chest.  She has noticed that the hives occur consistently at the end of her menstrual cycle, however the hives occur at other times as well.  She is currently taking levocetirizine 5 mg once a day.  Individual lesions typically resolve completely within 1 to 3 hours without residual pigmentation or bruising. In the interval since her previous visit her asthma has not been well controlled.  She reports that she requires albuterol rescue every 4 hours while at work.  She believes that her asthma is exacerbated at work by the metal dust exposure as well as the steep stairs that she has to use while there.  She does not complain of lower respiratory symptoms occurring outside of the workplace. She reports that her nasal symptoms are stable, however still experiences nasal congestion because of "collecting metal dust" in her nostrils despite the use of a mask.  Assessment and plan: Recurrent urticaria Given the persistence of urticaria for more than 8 weeks, we will proceed to laboratory evaluation.  The following labs have been ordered: FCeRI antibody, anti-thyroglobulin antibody, thyroid peroxidase antibody, tryptase, H. pylori serology, CBC, CMP, ESR, ANA, and alpha-gal panel.  The patient will be called with further recommendations after lab results have returned.  Instructions have been  discussed and provided for H1/H2 receptor blockade with titration to find lowest effective dose.  Should there be a significant increase or change in symptoms, a journal is to be kept recording any foods eaten, beverages consumed, medications taken within a 6 hour period prior to the onset of symptoms, as well as record activities being performed, and environmental conditions. For any symptoms concerning for anaphylaxis, 911 is to be called immediately.  Chronic rhinitis  Continue fluticasone nasal spray, 2 sprays per nostril daily as needed.  Nasal saline lavage (NeilMed) has been recommended as needed and prior to medicated nasal sprays along with instructions for proper administration.  Mild persistent asthma  A prescription has been provided for Flovent (fluticasone) 110 g, 2 inhalations twice a day. To maximize pulmonary deposition, a spacer has been provided along with instructions for its proper administration with an HFA inhaler.  Continue albuterol HFA, 1 to 2 inhalations every 4-6 hours if needed.  Subjective and objective measures of pulmonary function will be followed and the treatment plan will be adjusted accordingly.  If there continues to be clear symptom correlation with the metal dust exposure at her place of work, she may need to consider employment that does not require exposure to metal dust.   Meds ordered this encounter  Medications  . fluticasone (FLONASE) 50 MCG/ACT nasal spray    Sig: Place 2 sprays into both nostrils daily as needed for allergies or rhinitis.    Dispense:  16 g    Refill:  5  . fluticasone (FLOVENT HFA) 110 MCG/ACT inhaler    Sig: Inhale 2 puffs into the lungs 2 (two) times daily.    Dispense:  1 Inhaler    Refill:  5    Diagnostics: Spirometry:  Normal with an FEV1 of 108% predicted. This study was performed while the patient was asymptomatic.  Please see scanned spirometry results for details.    Physical examination: Blood pressure  136/78, pulse 86, temperature 98.1 F (36.7 C), temperature source Temporal, resp. rate 16, SpO2 98 %.  General: Alert, interactive, in no acute distress. HEENT: TMs pearly gray, turbinates mildly edematous without discharge, post-pharynx mildly erythematous. Neck: Supple without lymphadenopathy. Lungs: Clear to auscultation without wheezing, rhonchi or rales. CV: Normal S1, S2 without murmurs. Skin: Warm and dry, without lesions or rashes.  The following portions of the patient's history were reviewed and updated as appropriate: allergies, current medications, past family history, past medical history, past social history, past surgical history and problem list.  Current Outpatient Medications  Medication Sig Dispense Refill  . acetaminophen (TYLENOL) 325 MG tablet Take 2 tablets (650 mg total) by mouth every 6 (six) hours.    Marland Kitchen albuterol (VENTOLIN HFA) 108 (90 Base) MCG/ACT inhaler 1-2 puffs every 4-6 hours as needed and 15 minutes prior to exercise. 18 g 2  . diphenhydrAMINE (BENADRYL) 25 MG tablet Take 1 tablet (25 mg total) by mouth every 6 (six) hours as needed for up to 30 doses for itching or allergies. 30 tablet 0  . EPINEPHrine 0.3 mg/0.3 mL IJ SOAJ injection Inject 0.3 mLs (0.3 mg total) into the muscle as needed for up to 2 doses for anaphylaxis. 1 each 2  . famotidine (PEPCID) 20 MG tablet Take 1 tablet (20 mg total) by mouth 2 (two) times daily as needed for heartburn or indigestion. 60 tablet 5  . fluticasone (FLONASE) 50 MCG/ACT nasal spray Place 2 sprays into both nostrils daily as needed for allergies or rhinitis. 16 g 5  . ibuprofen (ADVIL) 200 MG tablet Take 3 tablets (600 mg total) by mouth every 6 (six) hours.    Marland Kitchen ibuprofen (ADVIL) 600 MG tablet Take 1 tablet (600 mg total) by mouth every 6 (six) hours as needed. 30 tablet 0  . levocetirizine (XYZAL) 5 MG tablet Take 1 tablet (5 mg total) by mouth daily as needed for allergies. 30 tablet 5  . oxyCODONE (ROXICODONE) 5 MG  immediate release tablet Take 1 tablet (5 mg total) by mouth every 6 (six) hours as needed for severe pain. 20 tablet 0  . predniSONE (STERAPRED UNI-PAK 21 TAB) 10 MG (21) TBPK tablet Take by mouth daily. Take as directed. 21 tablet 0  . cetirizine (ZYRTEC ALLERGY) 10 MG tablet Take 1 tablet (10 mg total) by mouth daily for 30 doses. Daytime (Nondrowsy) antihistamine 30 tablet 0  . fluticasone (FLOVENT HFA) 110 MCG/ACT inhaler Inhale 2 puffs into the lungs 2 (two) times daily. 1 Inhaler 5   No current facility-administered medications for this visit.    Allergies  Allergen Reactions  . Penicillins Shortness Of Breath and Other (See Comments)    Shut patients throat.  . Sulfa Antibiotics Shortness Of Breath and Other (See Comments)    Shuts patient's throat.  Marland Kitchen Avocado Hives   Review of systems: Review of systems negative except as noted in HPI / PMHx.  Past Medical History:  Diagnosis Date  . Asthma   . Cellulitis    cellulitis/abcess x15, "every 2 months"    History reviewed. No pertinent family history.  Social History   Socioeconomic History  . Marital status: Single    Spouse name: Not on file  .  Number of children: Not on file  . Years of education: Not on file  . Highest education level: Not on file  Occupational History  . Not on file  Tobacco Use  . Smoking status: Never Smoker  . Smokeless tobacco: Never Used  Substance and Sexual Activity  . Alcohol use: No  . Drug use: No  . Sexual activity: Yes    Birth control/protection: Surgical  Other Topics Concern  . Not on file  Social History Narrative  . Not on file   Social Determinants of Health   Financial Resource Strain:   . Difficulty of Paying Living Expenses: Not on file  Food Insecurity:   . Worried About Charity fundraiser in the Last Year: Not on file  . Ran Out of Food in the Last Year: Not on file  Transportation Needs:   . Lack of Transportation (Medical): Not on file  . Lack of  Transportation (Non-Medical): Not on file  Physical Activity:   . Days of Exercise per Week: Not on file  . Minutes of Exercise per Session: Not on file  Stress:   . Feeling of Stress : Not on file  Social Connections:   . Frequency of Communication with Friends and Family: Not on file  . Frequency of Social Gatherings with Friends and Family: Not on file  . Attends Religious Services: Not on file  . Active Member of Clubs or Organizations: Not on file  . Attends Archivist Meetings: Not on file  . Marital Status: Not on file  Intimate Partner Violence:   . Fear of Current or Ex-Partner: Not on file  . Emotionally Abused: Not on file  . Physically Abused: Not on file  . Sexually Abused: Not on file    I appreciate the opportunity to take part in [facility] care. Please do not hesitate to contact me with questions.  Sincerely,   R. Edgar Frisk, MD

## 2019-11-15 NOTE — Assessment & Plan Note (Signed)
   Continue fluticasone nasal spray, 2 sprays per nostril daily as needed.  Nasal saline lavage (NeilMed) has been recommended as needed and prior to medicated nasal sprays along with instructions for proper administration.

## 2019-11-26 LAB — CBC WITH DIFFERENTIAL/PLATELET
Basophils Absolute: 0.1 10*3/uL (ref 0.0–0.2)
Basos: 1 %
EOS (ABSOLUTE): 0.2 10*3/uL (ref 0.0–0.4)
Eos: 2 %
Hematocrit: 36.4 % (ref 34.0–46.6)
Hemoglobin: 11.8 g/dL (ref 11.1–15.9)
Immature Grans (Abs): 0 10*3/uL (ref 0.0–0.1)
Immature Granulocytes: 0 %
Lymphocytes Absolute: 4.2 10*3/uL — ABNORMAL HIGH (ref 0.7–3.1)
Lymphs: 48 %
MCH: 25.8 pg — ABNORMAL LOW (ref 26.6–33.0)
MCHC: 32.4 g/dL (ref 31.5–35.7)
MCV: 80 fL (ref 79–97)
Monocytes Absolute: 0.7 10*3/uL (ref 0.1–0.9)
Monocytes: 8 %
Neutrophils Absolute: 3.6 10*3/uL (ref 1.4–7.0)
Neutrophils: 41 %
Platelets: 382 10*3/uL (ref 150–450)
RBC: 4.57 x10E6/uL (ref 3.77–5.28)
RDW: 13.8 % (ref 11.7–15.4)
WBC: 8.9 10*3/uL (ref 3.4–10.8)

## 2019-11-26 LAB — COMPREHENSIVE METABOLIC PANEL
ALT: 13 IU/L (ref 0–32)
AST: 15 IU/L (ref 0–40)
Albumin/Globulin Ratio: 1.8 (ref 1.2–2.2)
Albumin: 4.6 g/dL (ref 3.8–4.8)
Alkaline Phosphatase: 54 IU/L (ref 39–117)
BUN/Creatinine Ratio: 11 (ref 9–23)
BUN: 7 mg/dL (ref 6–20)
Bilirubin Total: 0.5 mg/dL (ref 0.0–1.2)
CO2: 25 mmol/L (ref 20–29)
Calcium: 9.4 mg/dL (ref 8.7–10.2)
Chloride: 109 mmol/L — ABNORMAL HIGH (ref 96–106)
Creatinine, Ser: 0.66 mg/dL (ref 0.57–1.00)
GFR calc Af Amer: 131 mL/min/{1.73_m2} (ref >59)
GFR calc non Af Amer: 114 mL/min/{1.73_m2} (ref >59)
Globulin, Total: 2.6 g/dL (ref 1.5–4.5)
Glucose: 83 mg/dL (ref 65–99)
Potassium: 4.6 mmol/L (ref 3.5–5.2)
Sodium: 140 mmol/L (ref 134–144)
Total Protein: 7.2 g/dL (ref 6.0–8.5)

## 2019-11-26 LAB — ALPHA-GAL PANEL
Alpha Gal IgE*: <0.1 kU/L (ref <0.10)
Beef (Bos spp) IgE: <0.1 kU/L (ref <0.35)
Class Interpretation: 0
Class Interpretation: 0
Class Interpretation: 0
Lamb/Mutton (Ovis spp) IgE: <0.1 kU/L (ref <0.35)
Pork (Sus spp) IgE: <0.1 kU/L (ref <0.35)

## 2019-11-26 LAB — H PYLORI, IGM, IGG, IGA AB
H pylori, IgM Abs: <9 units (ref 0.0–8.9)
H. pylori, IgA Abs: <9 units (ref 0.0–8.9)
H. pylori, IgG AbS: 0.12 Index Value (ref 0.00–0.79)

## 2019-11-26 LAB — ANA W/REFLEX IF POSITIVE: Anti Nuclear Antibody (ANA): NEGATIVE

## 2019-11-26 LAB — THYROID ANTIBODIES
Thyroglobulin Antibody: <1 IU/mL (ref 0.0–0.9)
Thyroperoxidase Ab SerPl-aCnc: <9 IU/mL (ref 0–34)

## 2019-11-26 LAB — CHRONIC URTICARIA: cu index: 2.3 (ref <10)

## 2019-11-26 LAB — TRYPTASE: Tryptase: 5.1 ug/L (ref 2.2–13.2)

## 2019-11-26 LAB — SEDIMENTATION RATE: Sed Rate: 2 mm/hr (ref 0–32)

## 2020-02-15 DIAGNOSIS — Z23 Encounter for immunization: Secondary | ICD-10-CM | POA: Diagnosis not present

## 2020-02-21 ENCOUNTER — Ambulatory Visit: Payer: BC Managed Care – PPO | Admitting: Allergy and Immunology

## 2020-02-21 ENCOUNTER — Ambulatory Visit (INDEPENDENT_AMBULATORY_CARE_PROVIDER_SITE_OTHER): Payer: BC Managed Care – PPO | Admitting: Allergy

## 2020-02-21 ENCOUNTER — Other Ambulatory Visit: Payer: Self-pay

## 2020-02-21 ENCOUNTER — Encounter: Payer: Self-pay | Admitting: Allergy

## 2020-02-21 DIAGNOSIS — J310 Chronic rhinitis: Secondary | ICD-10-CM

## 2020-02-21 DIAGNOSIS — J4530 Mild persistent asthma, uncomplicated: Secondary | ICD-10-CM | POA: Diagnosis not present

## 2020-02-21 DIAGNOSIS — L500 Allergic urticaria: Secondary | ICD-10-CM

## 2020-02-21 NOTE — Assessment & Plan Note (Addendum)
Past history - Lab evaluation unremarkable (FCeRI antibody, anti-thyroglobulin antibody, thyroid peroxidase antibody, tryptase, H. pylori serology, CBC, CMP, ESR, ANA, and alpha-gal panel) Interim history - breaks out if misses her Xyzal dose.   Continue Xyzal (levocetirizine) 5mg daily and if hive free after 1 month then try to take 1/2 tablet daily for another 1 month.  If no hives then try to take 1/2 tablet every other day.  If you break out in hives then go back to 5mg daily and may take it twice a day for breakthrough hives if needed.  If you notice worsening symptoms then let us know.   For mild symptoms you can take over the counter antihistamines such as Benadryl and monitor symptoms closely. If symptoms worsen or if you have severe symptoms including breathing issues, throat closure, significant swelling, whole body hives, severe diarrhea and vomiting, lightheadedness then inject epinephrine and seek immediate medical care afterwards. 

## 2020-02-21 NOTE — Assessment & Plan Note (Signed)
Improved with addition of Flovent.  Today's spirometry was normal. ACT score 22.  Daily controller medication(s): continue Flovent 2 puffs twice a day with spacer and rinse mouth afterwards. Prior to physical activity: May use albuterol rescue inhaler 2 puffs 5 to 15 minutes prior to strenuous physical activities. Rescue medications: May use albuterol rescue inhaler 2 puffs or nebulizer every 4 to 6 hours as needed for shortness of breath, chest tightness, coughing, and wheezing. Monitor frequency of use.

## 2020-02-21 NOTE — Assessment & Plan Note (Signed)
Past history - 2020 skin testing negative to pediatric environmental panel.  Interim history - stable.  Continue fluticasone nasal spray, 1 spray per nostril 1-2 times daily as needed.  Nasal saline lavage (NeilMed) has been recommended as needed and prior to medicated nasal sprays along with instructions for proper administration. 

## 2020-02-21 NOTE — Progress Notes (Signed)
Follow Up Note  RE: Alison Vega MRN: [id number] DOB: [birth year] Date of Office Visit: 02/21/2020  Referring provider: No ref. provider found Primary care provider: Patient, No Pcp Per  Chief Complaint: Asthma and Urticaria  History of Present Illness: I had the pleasure of seeing Alison Vega for a follow up visit at the [facility] on 02/21/2020. She is a 39 y.o. female, who is being followed for urticaria, rhinitis, asthma. Her previous allergy office visit was on 11/15/19 with Dr. Verlin Fester. Today is a regular follow up visit.  Recurrent urticaria Patient has been taking Xyzal 71m in the morning and sometimes takes 2 pills during outbreaks with good benefit. Now having outbreaks mainly after she wakes up when she misses a dose. If she takes Xyzal daily then no outbreaks.  Reviewed images on the phone - consistent with urticarial rash.   Chronic rhinitis 2020 testing negative to ped panel. Currently using Flonase 1 spray per nostril twice a day with good benefit. No nosebleeds.   Mild persistent asthma Currently on Flovent 110 2 puffs twice a day with some benefit.  Having some shortness of breath with exertion related activities mainly at work.  Not using albuterol as often as before.    Assessment and Plan: [name] a 39y.o. female with: Recurrent urticaria Past history - Lab evaluation unremarkable (FCeRI antibody, anti-thyroglobulin antibody, thyroid peroxidase antibody, tryptase, Alison Vega serology, CBC, CMP, ESR, ANA, and alpha-gal panel) Interim history - breaks out if misses her Xyzal dose.   Continue Xyzal (levocetirizine) 536mdaily and if hive free after 1 month then try to take 1/2 tablet daily for another 1 month.  If no hives then try to take 1/2 tablet every other day.  If you break out in hives then go back to 19m8maily and may take it twice a day for breakthrough hives if needed.  If you notice worsening symptoms then let us [place].    For mild symptoms you can take over the counter antihistamines such as Benadryl and monitor symptoms closely. If symptoms worsen or if you have severe symptoms including breathing issues, throat closure, significant swelling, whole body hives, severe diarrhea and vomiting, lightheadedness then inject epinephrine and seek immediate medical care afterwards.  Mild persistent asthma Improved with addition of Flovent.  Today's spirometry was normal. ACT score 22.  Daily controller medication(s): continue Flovent 110m46m puffs twice a day with spacer and rinse mouth afterwards. Prior to physical activity: May use albuterol rescue inhaler 2 puffs 5 to 15 minutes prior to strenuous physical activities. Rescue medications: May use albuterol rescue inhaler 2 puffs or nebulizer every 4 to 6 hours as needed for shortness of breath, chest tightness, coughing, and wheezing. Monitor frequency of use.   Chronic rhinitis Past history - 2020 skin testing negative to pediatric environmental panel.  Interim history - stable.  Continue fluticasone nasal spray, 1 spray per nostril 1-2 times daily as needed.  Nasal saline lavage (NeilMed) has been recommended as needed and prior to medicated nasal sprays along with instructions for proper administration.  Return in about 4 months (around 06/22/2020).  Diagnostics: Spirometry:  Tracings reviewed. Her effort: Good reproducible efforts. FVC: 3.02L FEV1: 2.78L, 105% predicted FEV1/FVC ratio: 92% Interpretation: Spirometry consistent with normal pattern.  Please see scanned spirometry results for details.  Medication List:  Current Outpatient Medications  Medication Sig Dispense Refill  . acetaminophen (TYLENOL) 325 MG tablet Take 2 tablets (650 mg total) by mouth  every 6 (six) hours.    Marland Kitchen albuterol (VENTOLIN HFA) 108 (90 Base) MCG/ACT inhaler 1-2 puffs every 4-6 hours as needed and 15 minutes prior to exercise. 18 g 2  . diphenhydrAMINE (BENADRYL) 25 MG  tablet Take 1 tablet (25 mg total) by mouth every 6 (six) hours as needed for up to 30 doses for itching or allergies. 30 tablet 0  . EPINEPHrine 0.3 mg/0.3 mL IJ SOAJ injection Inject 0.3 mLs (0.3 mg total) into the muscle as needed for up to 2 doses for anaphylaxis. 1 each 2  . fluticasone (FLONASE) 50 MCG/ACT nasal spray Place 2 sprays into both nostrils daily as needed for allergies or rhinitis. 16 g 5  . fluticasone (FLOVENT HFA) 110 MCG/ACT inhaler Inhale 2 puffs into the lungs 2 (two) times daily. 1 Inhaler 5  . levocetirizine (XYZAL) 5 MG tablet Take 1 tablet (5 mg total) by mouth daily as needed for allergies. 30 tablet 5  . cetirizine (ZYRTEC ALLERGY) 10 MG tablet Take 1 tablet (10 mg total) by mouth daily for 30 doses. Daytime (Nondrowsy) antihistamine 30 tablet 0  . famotidine (PEPCID) 20 MG tablet Take 1 tablet (20 mg total) by mouth 2 (two) times daily as needed for heartburn or indigestion. (Patient not taking: Reported on 02/21/2020) 60 tablet 5   No current facility-administered medications for this visit.   Allergies: Allergies  Allergen Reactions  . Penicillins Shortness Of Breath and Other (See Comments)    Shut patients throat.  . Sulfa Antibiotics Shortness Of Breath and Other (See Comments)    Shuts patient's throat.  Marland Kitchen Avocado Hives   I reviewed her past medical history, social history, family history, and environmental history and no significant changes have been reported from her previous visit.  Review of Systems  Constitutional: Negative for appetite change, chills, fever and unexpected weight change.  HENT: Negative for congestion and rhinorrhea.   Eyes: Negative for itching.  Respiratory: Negative for cough, chest tightness, shortness of breath and wheezing.   Gastrointestinal: Negative for abdominal pain.  Skin: Positive for rash.  Allergic/Immunologic: Negative for environmental allergies and food allergies.  Neurological: Negative for headaches.    Objective: There were no vitals taken for this visit. There is no height or weight on file to calculate BMI. Physical Exam  Constitutional: She is oriented to person, place, and time. She appears well-developed and well-nourished.  HENT:  Head: Normocephalic and atraumatic.  Right Ear: External ear normal.  Left Ear: External ear normal.  Nose: Nose normal.  Mouth/Throat: Oropharynx is clear and moist.  Eyes: Conjunctivae and EOM are normal.  Cardiovascular: Normal rate, regular rhythm and normal heart sounds. Exam reveals no gallop and no friction rub.  No murmur heard. Pulmonary/Chest: Effort normal and breath sounds normal. She has no wheezes. She has no rales.  Musculoskeletal:     Cervical back: Neck supple.  Neurological: She is alert and oriented to person, place, and time.  Skin: Skin is warm. No rash noted.  Psychiatric: She has a normal mood and affect. Her behavior is normal.  Nursing note and vitals reviewed.  Previous notes and tests were reviewed. The plan was reviewed with the patient/family, and all questions/concerned were addressed.  It was my pleasure to see [facility] today and participate in her care. Please feel free to contact me with any questions or concerns.  Sincerely,  Rexene Alberts, DO Allergy & Immunology  [facility] office: [phone] [facility] office:  [place] office: [phone]

## 2020-02-21 NOTE — Patient Instructions (Addendum)
Recurrent urticaria  Continue Xyzal (levocetirizine) 5mg  daily and if hive free after 1 month then try to take 1/2 tablet daily for another 1 month.  If no hives then try to take 1/2 tablet every other day.  If you break out in hives then go back to 5mg  daily and make take it twice a day for breakthrough hives if needed.  If you notice worsening symptoms then let know.   For mild symptoms you can take over the counter antihistamines such as Benadryl and monitor symptoms closely. If symptoms worsen or if you have severe symptoms including breathing issues, throat closure, significant swelling, whole body hives, severe diarrhea and vomiting, lightheadedness then inject epinephrine and seek immediate medical care afterwards.  Chronic rhinitis  Continue fluticasone nasal spray, 1 spray per nostril 1-2 times daily as needed.  Nasal saline lavage (NeilMed) has been recommended as needed and prior to medicated nasal sprays along with instructions for proper administration.  Mild persistent asthma Daily controller medication(s): continue Flovent 2 puffs twice a day with spacer and rinse mouth afterwards. Prior to physical activity: May use albuterol rescue inhaler 2 puffs 5 to 15 minutes prior to strenuous physical activities. Rescue medications: May use albuterol rescue inhaler 2 puffs or nebulizer every 4 to 6 hours as needed for shortness of breath, chest tightness, coughing, and wheezing. Monitor frequency of use.  Asthma control goals:  Full participation in all desired activities (may need albuterol before activity) Albuterol use two times or less a week on average (not counting use with activity) Cough interfering with sleep two times or less a month Oral steroids no more than once a year No hospitalizations  Follow up in 4 months or sooner if needed.

## 2020-02-27 ENCOUNTER — Telehealth: Payer: Self-pay | Admitting: Allergy

## 2020-02-27 MED ORDER — FLOVENT HFA 110 MCG/ACT IN AERO: 2.0000 | INHALATION_SPRAY | Freq: Two times a day (BID) | RESPIRATORY_TRACT | 5 refills | Status: DC

## 2020-02-27 NOTE — Telephone Encounter (Signed)
Patient called requesting refill on Flovent. Flovent 110 2 puffs Bid sent to pharmacy.

## 2020-03-16 DIAGNOSIS — Z23 Encounter for immunization: Secondary | ICD-10-CM | POA: Diagnosis not present

## 2020-06-25 NOTE — Progress Notes (Signed)
Follow Up Note  RE: Alison Vega MRN: [id number] DOB: Feb 14, 1981 Date of Office Visit: 06/26/2020  Referring provider: No ref. provider found Primary care provider: Patient, No Pcp Per  Chief Complaint: Follow-up and Urticaria (current flare up)  History of Present Illness: I had the pleasure of seeing [facility] for a follow up visit at the [facility] [place] [facility] [place] on 06/26/2020. She is a 39 y.o. female, who is being followed for urticaria, asthma and chronic rhinitis. Her previous allergy office visit was on 02/21/2020 with Dr. Maudie Mercury. Today is a regular follow up visit.  Recurrent urticaria Currently taking Xyzal 1 tablet 1-2 times a day which helps the itching but still breaking out. It does not cause drowsiness. No triggers noted. Not able to wean off the medication.   Patient works in a [employer] ([employer]) which gets extremely hot indoors. No other associated symptoms. Patient cannot recall trying Singulair or Pepcid for this in the past.  Mild persistent asthma Sometimes gets shortness of breath at work with exertion as it has been very hot.   Patient ran out of Flovent 2 days ago and noticing worsening symptoms and using the albuterol more often.  Denies any ER/urgent care visits or prednisone use since the last visit.  Chronic rhinitis Using fluticasone nasal spray, 1 spray per nostril 1-2 times daily as needed with some benefit.  Assessment and Plan: Tammala is a 39 y.o. female with: Recurrent urticaria Past history - Lab evaluation unremarkable (FCeRI antibody, anti-thyroglobulin antibody, thyroid peroxidase antibody, tryptase, H. pylori serology, CBC, CMP, ESR, ANA, and alpha-gal panel) Interim history - Unable to wean off Xyzal. Still breaking out daily.  START famotidine (pepcid) 66m twice a day.  INCREASE Xyzal to 1 pill twice a day. Start Singulair (montelukast) 145mdaily at night. Cautioned that in some children/adults can experience behavioral changes  including hyperactivity, agitation, depression, sleep disturbances and suicidal ideations. These side effects are rare, but if you notice them you should notify me and discontinue Singulair (montelukast).  If not better by the next visit, will discuss starting Xolair next.   Mild persistent asthma Shortness of breath at work with exertion. Ran out of Flovent and noticed worsening symptoms.  Today's spirometry was normal.  Daily controller medication(s): STOP Flovent  START Symbicort 8075m2 puffs twice a day with spacer and rinse mouth afterwards. Prior to physical activity: May use albuterol rescue inhaler 2 puffs 5 to 15 minutes prior to strenuous physical activities. Rescue medications: May use albuterol rescue inhaler 2 puffs or nebulizer every 4 to 6 hours as needed for shortness of breath, chest tightness, coughing, and wheezing. Monitor frequency of use.   Chronic rhinitis Past history - 2020 skin testing negative to pediatric environmental panel.  Interim history - stable.  Continue fluticasone nasal spray, 1 spray per nostril 1-2 times daily as needed.  Nasal saline spray (i.e., Simply Saline) or nasal saline lavage (i.e., NeilMed) is recommended as needed and prior to medicated nasal sprays.  Recommend using saline wash after work as well.   Return in about 4 weeks (around 07/24/2020).  Meds ordered this encounter  Medications  . famotidine (PEPCID) 20 MG tablet    Sig: Take 1 tablet (20 mg total) by mouth 2 (two) times daily as needed for heartburn or indigestion.    Dispense:  60 tablet    Refill:  5  . montelukast (SINGULAIR) 10 MG tablet    Sig: Take 1 tablet (10 mg total) by mouth  at bedtime.    Dispense:  30 tablet    Refill:  5  . budesonide-formoterol (SYMBICORT) 80-4.5 MCG/ACT inhaler    Sig: Inhale 2 puffs into the lungs in the morning and at bedtime. with spacer and rinse mouth afterwards.    Dispense:  1 each    Refill:  5   Diagnostics: Spirometry:    Tracings reviewed. Her effort: Good reproducible efforts. FVC: 3.04L FEV1: 2.76L, 105% predicted FEV1/FVC ratio: 91% Interpretation: Spirometry consistent with normal pattern.  Please see scanned spirometry results for details.  Medication List:  Current Outpatient Medications  Medication Sig Dispense Refill  . acetaminophen (TYLENOL) 325 MG tablet Take 2 tablets (650 mg total) by mouth every 6 (six) hours.    Marland Kitchen albuterol (VENTOLIN HFA) 108 (90 Base) MCG/ACT inhaler 1-2 puffs every 4-6 hours as needed and 15 minutes prior to exercise. 18 g 2  . diphenhydrAMINE (BENADRYL) 25 MG tablet Take 1 tablet (25 mg total) by mouth every 6 (six) hours as needed for up to 30 doses for itching or allergies. 30 tablet 0  . EPINEPHrine 0.3 mg/0.3 mL IJ SOAJ injection Inject 0.3 mLs (0.3 mg total) into the muscle as needed for up to 2 doses for anaphylaxis. 1 each 2  . fluticasone (FLONASE) 50 MCG/ACT nasal spray Place 2 sprays into both nostrils daily as needed for allergies or rhinitis. 16 g 5  . levocetirizine (XYZAL) 5 MG tablet Take 1 tablet (5 mg total) by mouth daily as needed for allergies. 30 tablet 5  . budesonide-formoterol (SYMBICORT) 80-4.5 MCG/ACT inhaler Inhale 2 puffs into the lungs in the morning and at bedtime. with spacer and rinse mouth afterwards. 1 each 5  . famotidine (PEPCID) 20 MG tablet Take 1 tablet (20 mg total) by mouth 2 (two) times daily as needed for heartburn or indigestion. 60 tablet 5  . montelukast (SINGULAIR) 10 MG tablet Take 1 tablet (10 mg total) by mouth at bedtime. 30 tablet 5   No current facility-administered medications for this visit.   Allergies: Allergies  Allergen Reactions  . Penicillins Shortness Of Breath and Other (See Comments)    Shut patients throat.  . Sulfa Antibiotics Shortness Of Breath and Other (See Comments)    Shuts patient's throat.  Marland Kitchen Avocado Hives   I reviewed her past medical history, social history, family history, and environmental  history and no significant changes have been reported from her previous visit.  Review of Systems  Constitutional: Negative for appetite change, chills, fever and unexpected weight change.  HENT: Negative for congestion and rhinorrhea.   Eyes: Negative for itching.  Respiratory: Positive for shortness of breath. Negative for cough, chest tightness and wheezing.   Gastrointestinal: Negative for abdominal pain.  Skin: Positive for rash.  Allergic/Immunologic: Negative for environmental allergies and food allergies.  Neurological: Negative for headaches.   Objective: BP 130/80   Pulse 96   Temp 97.6 F (36.4 C) (Temporal)   Resp 18   Wt 154 lb 1.9 oz (69.9 kg)   SpO2 99%   BMI 30.35 kg/m  Body mass index is 30.35 kg/m. Physical Exam Vitals and nursing note reviewed.  Constitutional:      Appearance: Normal appearance. She is well-developed.  HENT:     Head: Normocephalic and atraumatic.     Right Ear: Tympanic membrane and external ear normal.     Left Ear: Tympanic membrane and external ear normal.     Nose: Nose normal.     Mouth/Throat:  Mouth: Mucous membranes are moist.     Pharynx: Oropharynx is clear.  Eyes:     Conjunctiva/sclera: Conjunctivae normal.  Cardiovascular:     Rate and Rhythm: Normal rate and regular rhythm.     Heart sounds: Normal heart sounds. No murmur heard.  No friction rub. No gallop.   Pulmonary:     Effort: Pulmonary effort is normal.     Breath sounds: Normal breath sounds. No wheezing or rales.  Musculoskeletal:     Cervical back: Neck supple.  Skin:    General: Skin is warm.     Findings: Rash present.     Comments: Linear raised erythematous rash on upper thighs b/l.  Neurological:     Mental Status: She is alert and oriented to person, place, and time.  Psychiatric:        Behavior: Behavior normal.    Previous notes and tests were reviewed. The plan was reviewed with the patient/family, and all questions/concerned were  addressed.  It was my pleasure to see [facility] today and participate in her care. Please feel free to contact me with any questions or concerns.  Sincerely,  Rexene Alberts, DO Allergy & Immunology  [facility] office: [phone] [facility] office: [place] office: [phone]

## 2020-06-26 ENCOUNTER — Encounter: Payer: Self-pay | Admitting: Allergy

## 2020-06-26 ENCOUNTER — Ambulatory Visit (INDEPENDENT_AMBULATORY_CARE_PROVIDER_SITE_OTHER): Payer: BC Managed Care – PPO | Admitting: Allergy

## 2020-06-26 ENCOUNTER — Other Ambulatory Visit: Payer: Self-pay

## 2020-06-26 VITALS — BP 130/80 | HR 96 | Temp 97.6°F | Resp 18 | Wt 154.1 lb

## 2020-06-26 DIAGNOSIS — L500 Allergic urticaria: Secondary | ICD-10-CM | POA: Diagnosis not present

## 2020-06-26 DIAGNOSIS — J310 Chronic rhinitis: Secondary | ICD-10-CM

## 2020-06-26 DIAGNOSIS — J4530 Mild persistent asthma, uncomplicated: Secondary | ICD-10-CM

## 2020-06-26 MED ORDER — FAMOTIDINE 20 MG PO TABS: 20.0000 mg | ORAL_TABLET | Freq: Two times a day (BID) | ORAL | 5 refills | Status: DC | PRN

## 2020-06-26 MED ORDER — BUDESONIDE-FORMOTEROL FUMARATE 80-4.5 MCG/ACT IN AERO: 2.0000 | INHALATION_SPRAY | Freq: Two times a day (BID) | RESPIRATORY_TRACT | 5 refills | Status: DC

## 2020-06-26 MED ORDER — MONTELUKAST SODIUM 10 MG PO TABS: 10.0000 mg | ORAL_TABLET | Freq: Every day | ORAL | 5 refills | Status: DC

## 2020-06-26 NOTE — Assessment & Plan Note (Signed)
Past history - Lab evaluation unremarkable (FCeRI antibody, anti-thyroglobulin antibody, thyroid peroxidase antibody, tryptase, H. pylori serology, CBC, CMP, ESR, ANA, and alpha-gal panel) Interim history - Unable to wean off Xyzal. Still breaking out daily.  START famotidine (pepcid) 41m twice a day.  INCREASE Xyzal to 1 pill twice a day. Start Singulair (montelukast) 187mdaily at night. Cautioned that in some children/adults can experience behavioral changes including hyperactivity, agitation, depression, sleep disturbances and suicidal ideations. These side effects are rare, but if you notice them you should notify me and discontinue Singulair (montelukast).  If not better by the next visit, will discuss starting Xolair next.

## 2020-06-26 NOTE — Assessment & Plan Note (Signed)
Past history - 2020 skin testing negative to pediatric environmental panel.  Interim history - stable.  Continue fluticasone nasal spray, 1 spray per nostril 1-2 times daily as needed.  Nasal saline spray (i.e., Simply Saline) or nasal saline lavage (i.e., NeilMed) is recommended as needed and prior to medicated nasal sprays.  Recommend using saline wash after work as well.

## 2020-06-26 NOTE — Patient Instructions (Addendum)
Recurrent urticaria  START famotidine (pepcid) 20mg  twice a day.  INCREASE xyzal to 1 pill twice a day. Start Singulair (montelukast) 10mg  daily at night. Cautioned that in some children/adults can experience behavioral changes including hyperactivity, agitation, depression, sleep disturbances and suicidal ideations. These side effects are rare, but if you notice them you should notify me and discontinue Singulair (montelukast).  If not better by the next visit, will discuss starting Xolair which is an injection for hives.   Chronic rhinitis  Continue fluticasone nasal spray, 1 spray per nostril 1-2 times daily as needed.  Nasal saline lavage (NeilMed) has been recommended as needed and prior to medicated nasal sprays along with instructions for proper administration.  Mild persistent asthma Daily controller medication(s): STOP Flovent  START symbicort 2 puffs twice a day with spacer and rinse mouth afterwards. Prior to physical activity: May use albuterol rescue inhaler 2 puffs 5 to 15 minutes prior to strenuous physical activities. Rescue medications: May use albuterol rescue inhaler 2 puffs or nebulizer every 4 to 6 hours as needed for shortness of breath, chest tightness, coughing, and wheezing. Monitor frequency of use.  Asthma control goals:  Full participation in all desired activities (may need albuterol before activity) Albuterol use two times or less a week on average (not counting use with activity) Cough interfering with sleep two times or less a month Oral steroids no more than once a year No hospitalizations  Follow up in 4 weeks or sooner if needed.

## 2020-06-26 NOTE — Assessment & Plan Note (Signed)
Shortness of breath at work with exertion. Ran out of Flovent and noticed worsening symptoms.  Today's spirometry was normal.  Daily controller medication(s): STOP Flovent  START Symbicort 2 puffs twice a day with spacer and rinse mouth afterwards. Prior to physical activity: May use albuterol rescue inhaler 2 puffs 5 to 15 minutes prior to strenuous physical activities. Rescue medications: May use albuterol rescue inhaler 2 puffs or nebulizer every 4 to 6 hours as needed for shortness of breath, chest tightness, coughing, and wheezing. Monitor frequency of use.

## 2020-07-30 NOTE — Progress Notes (Signed)
Follow Up Note  RE: Alison Vega MRN: [id number] DOB: [birth year] Date of Office Visit: 07/31/2020  Referring provider: No ref. provider found Primary care provider: Patient, No Pcp Per  Chief Complaint: Follow-up and Urticaria  History of Present Illness: I had the pleasure of seeing Alison Vega for a follow up visit at the [facility] on 07/31/2020. She is a 39 y.o. female, who is being followed for urticaria, asthma and chronic rhinitis. Her previous allergy office visit was on 06/26/2020 with Dr. Maudie Mercury. Today is a regular follow up visit.  Urticaria Currently on Xyzal twice a day, famotidine twice a day, Singulair daily at night with good benefit. Patient notices flares if she forgets to take a dose of her medication. She had 1 major flare since the last visit.   Mild persistent asthma Currently on Symbicort 42mg 2 puffs twice a day and noticed improvement already.   About 2 weeks [facility] and her oxygen level went down to the 80s. She checked due to her chest tightness. She took her albuterol 2 puffs which helped after 30 minutes.  She has been checking her oxygen levels when she gets chest tightness and headaches with her phone and notices it goes down to low 90s. Usually it runs 95-98%.  This usually happens when she is at work.  Chronic rhinitis Currently on fluticasone 1 spray once a day with good benefit. No nosebleeds.   Assessment and Plan: Alison Vega a 39y.o. female with: Recurrent urticaria Past history - Lab evaluation unremarkable (FCeRI antibody, anti-thyroglobulin antibody, thyroid peroxidase antibody, tryptase, H. pylori serology, CBC, CMP, ESR, ANA, and alpha-gal panel) Interim history - stable with below regimen but flares if misses a dose of medication.  Continue famotidine (Pepcid) 245mtwice a day.  Continue Xyzal 1 pill twice a day. Continue Singulair (montelukast) 1060maily at night. Keep track of  hives.   If not better by the next visit, will discuss starting Xolair.   Chronic rhinitis Past history - 2020 skin testing negative to pediatric environmental panel.  Interim history - stable.  Continue fluticasone nasal spray, 1 spray per nostril 1-2 times daily as needed.  Nasal saline lavage (NeilMed) has been recommended as needed and prior to medicated nasal sprays along with instructions for proper administration.  Mild persistent asthma Improved with Symbicort but had an episode of chest tightness at work and her oxygen dropped in the 80s. Improved after albuterol after 30 minutes. Get CXR - none recently.  Get pulse oxygen reader for the finger - instead of using the phone.  If it drops below 92% - take 4 puffs of albuterol and if it does not improve then please go to the ER. Daily controller medication(s): Symbicort 65m2m puffs twice a day with spacer and rinse mouth afterwards. May use albuterol rescue inhaler 2-4 puffs every 4 to 6 hours as needed for shortness of breath, chest tightness, coughing, and wheezing. May use albuterol rescue inhaler 2 puffs 5 to 15 minutes prior to strenuous physical activities. Monitor frequency of use.   Repeat spirometry at next visit - may need to increase Symbicort to 160mc5m breathing is not improved.   Return in about 6 weeks (around 09/11/2020).  Meds ordered this encounter  Medications  . fluticasone (FLONASE) 50 MCG/ACT nasal spray    Sig: Place 1-2 sprays into both nostrils daily as needed for allergies or rhinitis.    Dispense:  16 g    Refill:  5   Diagnostics: None.  Medication List:  Current Outpatient Medications  Medication Sig Dispense Refill  . acetaminophen (TYLENOL) 325 MG tablet Take 2 tablets (650 mg total) by mouth every 6 (six) hours.    Marland Kitchen albuterol (VENTOLIN HFA) 108 (90 Base) MCG/ACT inhaler 1-2 puffs every 4-6 hours as needed and 15 minutes prior to exercise. 18 g 2  . budesonide-formoterol (SYMBICORT)  80-4.5 MCG/ACT inhaler Inhale 2 puffs into the lungs in the morning and at bedtime. with spacer and rinse mouth afterwards. 1 each 5  . diphenhydrAMINE (BENADRYL) 25 MG tablet Take 1 tablet (25 mg total) by mouth every 6 (six) hours as needed for up to 30 doses for itching or allergies. 30 tablet 0  . EPINEPHrine 0.3 mg/0.3 mL IJ SOAJ injection Inject 0.3 mLs (0.3 mg total) into the muscle as needed for up to 2 doses for anaphylaxis. 1 each 2  . famotidine (PEPCID) 20 MG tablet Take 1 tablet (20 mg total) by mouth 2 (two) times daily as needed for heartburn or indigestion. 60 tablet 5  . fluticasone (FLONASE) 50 MCG/ACT nasal spray Place 1-2 sprays into both nostrils daily as needed for allergies or rhinitis. 16 g 5  . levocetirizine (XYZAL) 5 MG tablet Take 1 tablet (5 mg total) by mouth daily as needed for allergies. 30 tablet 5  . montelukast (SINGULAIR) 10 MG tablet Take 1 tablet (10 mg total) by mouth at bedtime. 30 tablet 5   No current facility-administered medications for this visit.   Allergies: Allergies  Allergen Reactions  . Penicillins Shortness Of Breath and Other (See Comments)    Shut patients throat.  . Sulfa Antibiotics Shortness Of Breath and Other (See Comments)    Shuts patient's throat.  Marland Kitchen Avocado Hives   I reviewed her past medical history, social history, family history, and environmental history and no significant changes have been reported from her previous visit.  Review of Systems  Constitutional: Negative for appetite change, chills, fever and unexpected weight change.  HENT: Negative for congestion and rhinorrhea.   Eyes: Negative for itching.  Respiratory: Positive for shortness of breath. Negative for cough, chest tightness and wheezing.   Gastrointestinal: Negative for abdominal pain.  Skin: Positive for rash.  Allergic/Immunologic: Negative for environmental allergies and food allergies.  Neurological: Negative for headaches.   Objective: BP 130/90    Pulse 91   Temp 98.1 F (36.7 C) (Temporal)   Resp 16   LMP 07/03/2020   SpO2 97%  There is no height or weight on file to calculate BMI. Physical Exam Vitals and nursing note reviewed.  Constitutional:      Appearance: Normal appearance. She is well-developed.  HENT:     Head: Normocephalic and atraumatic.     Right Ear: Tympanic membrane and external ear normal.     Left Ear: Tympanic membrane and external ear normal.     Nose: Nose normal.     Mouth/Throat:     Mouth: Mucous membranes are moist.     Pharynx: Oropharynx is clear.  Eyes:     Conjunctiva/sclera: Conjunctivae normal.  Cardiovascular:     Rate and Rhythm: Normal rate and regular rhythm.     Heart sounds: Normal heart sounds. No murmur heard.  No friction rub. No gallop.   Pulmonary:     Effort: Pulmonary effort is normal.     Breath sounds: Normal breath sounds. No wheezing or rales.  Musculoskeletal:  Cervical back: Neck supple.  Skin:    General: Skin is warm.     Findings: No rash.  Neurological:     Mental Status: She is alert and oriented to person, place, and time.  Psychiatric:        Behavior: Behavior normal.    Previous notes and tests were reviewed. The plan was reviewed with the patient/family, and all questions/concerned were addressed.  It was my pleasure to see [facility] today and participate in her care. Please feel free to contact me with any questions or concerns.  Sincerely,  Rexene Alberts, DO Allergy & Immunology  [facility] office: [place] office: [phone]

## 2020-07-31 ENCOUNTER — Ambulatory Visit
Admission: RE | Admit: 2020-07-31 | Discharge: 2020-07-31 | Disposition: A | Discharge status: Home / Self Care | Payer: BC Managed Care – PPO | Source: Ambulatory Visit | Attending: Allergy | Admitting: Allergy

## 2020-07-31 ENCOUNTER — Ambulatory Visit (INDEPENDENT_AMBULATORY_CARE_PROVIDER_SITE_OTHER): Payer: BC Managed Care – PPO | Admitting: Allergy

## 2020-07-31 ENCOUNTER — Encounter: Payer: Self-pay | Admitting: Allergy

## 2020-07-31 ENCOUNTER — Other Ambulatory Visit: Payer: Self-pay

## 2020-07-31 VITALS — BP 130/90 | HR 91 | Temp 98.1°F | Resp 16

## 2020-07-31 DIAGNOSIS — J4530 Mild persistent asthma, uncomplicated: Secondary | ICD-10-CM

## 2020-07-31 DIAGNOSIS — L500 Allergic urticaria: Secondary | ICD-10-CM

## 2020-07-31 DIAGNOSIS — J310 Chronic rhinitis: Secondary | ICD-10-CM | POA: Diagnosis not present

## 2020-07-31 DIAGNOSIS — R0602 Shortness of breath: Secondary | ICD-10-CM

## 2020-07-31 MED ORDER — FLUTICASONE PROPIONATE 50 MCG/ACT NA SUSP: 1.0000 | Freq: Every day | NASAL | 5 refills | Status: DC | PRN

## 2020-07-31 NOTE — Assessment & Plan Note (Signed)
Past history - Lab evaluation unremarkable (FCeRI antibody, anti-thyroglobulin antibody, thyroid peroxidase antibody, tryptase, H. pylori serology, CBC, CMP, ESR, ANA, and alpha-gal panel) Interim history - stable with below regimen but flares if misses a dose of medication.  Continue famotidine (Pepcid) 11m twice a day.  Continue Xyzal 1 pill twice a day. Continue Singulair (montelukast) 134mdaily at night. Keep track of hives.   If not better by the next visit, will discuss starting Xolair.

## 2020-07-31 NOTE — Assessment & Plan Note (Signed)
Past history - 2020 skin testing negative to pediatric environmental panel.  Interim history - stable.  Continue fluticasone nasal spray, 1 spray per nostril 1-2 times daily as needed.  Nasal saline lavage (NeilMed) has been recommended as needed and prior to medicated nasal sprays along with instructions for proper administration.

## 2020-07-31 NOTE — Patient Instructions (Addendum)
Recurrent urticaria  Continue famotidine (Pepcid) 20mg  twice a day.  Continue Xyzal 1 pill twice a day. Continue Singulair (montelukast) 10mg  daily at night. Keep track of hives.   If not better by the next visit, will discuss starting Xolair which is an injection for hives.   Chronic rhinitis  Continue fluticasone nasal spray, 1 spray per nostril 1-2 times daily as needed.  Nasal saline lavage (NeilMed) has been recommended as needed and prior to medicated nasal sprays along with instructions for proper administration.  Mild persistent asthma Get CXR. Get pulse oxygen reader for the finger. If it drops below 92% - take 4 puffs of albuterol and if it does not improve then please go to the ER. Daily controller medication(s): Symbicort 2 puffs twice a day with spacer and rinse mouth afterwards. May use albuterol rescue inhaler 2-4 puffs every 4 to 6 hours as needed for shortness of breath, chest tightness, coughing, and wheezing. May use albuterol rescue inhaler 2 puffs 5 to 15 minutes prior to strenuous physical activities. Monitor frequency of use.  Asthma control goals:  Full participation in all desired activities (may need albuterol before activity) Albuterol use two times or less a week on average (not counting use with activity) Cough interfering with sleep two times or less a month Oral steroids no more than once a year No hospitalizations  Follow up in 6 weeks or sooner if needed.

## 2020-07-31 NOTE — Assessment & Plan Note (Signed)
Improved with Symbicort but had an episode of chest tightness at work and her oxygen dropped in the 80s. Improved after albuterol after 30 minutes. Get CXR - none recently.  Get pulse oxygen reader for the finger - instead of using the phone.  If it drops below 92% - take 4 puffs of albuterol and if it does not improve then please go to the ER. Daily controller medication(s): Symbicort 2 puffs twice a day with spacer and rinse mouth afterwards. May use albuterol rescue inhaler 2-4 puffs every 4 to 6 hours as needed for shortness of breath, chest tightness, coughing, and wheezing. May use albuterol rescue inhaler 2 puffs 5 to 15 minutes prior to strenuous physical activities. Monitor frequency of use.   Repeat spirometry at next visit - may need to increase Symbicort to if breathing is not improved.

## 2020-08-22 NOTE — Progress Notes (Deleted)
° °Follow Up Note ° °RE: Alison Vega MRN: [id number] DOB: 11/20/1980 °Date of Office Visit: 08/23/2020 ° °Referring provider: No ref. provider found °Primary care provider: Patient, No Pcp Per ° °Chief Complaint: Urticaria ° °History of Present Illness: °I had the pleasure of seeing Alison Vega for a follow up visit at the [facility] on 08/23/2020. She is a 39 y.o. female, who is being followed for recurrent urticaria, chronic rhinitis and asthma. Her previous allergy office visit was on 07/31/2020 with Dr. . Today is a regular follow up visit. ° °Recurrent urticaria °Past history - Lab evaluation unremarkable (FCeRI antibody, anti-thyroglobulin antibody, thyroid peroxidase antibody, tryptase, H. pylori serology, CBC, CMP, ESR, ANA, and alpha-gal panel) °Interim history - stable with below regimen but flares if misses a dose of medication. °? Continue famotidine (Pepcid) 20mg twice a day. °? Continue Xyzal 1 pill twice a day. °· Continue Singulair (montelukast) 10mg daily at night. °· Keep track of hives.  °? If not better by the next visit, will discuss starting Xolair.  °  °Chronic rhinitis °Past history - 2020 skin testing negative to pediatric environmental panel.  °Interim history - stable. °· Continue fluticasone nasal spray, 1 spray per nostril 1-2 times daily as needed. °· Nasal saline lavage (NeilMed) has been recommended as needed and prior to medicated nasal sprays along with instructions for proper administration. °  °Mild persistent asthma °Improved with Symbicort but had an episode of chest tightness at work and her oxygen dropped in the 80s. Improved after albuterol after 30 minutes. °· Get CXR - none recently.  °· Get pulse oxygen reader for the finger - instead of using the phone.  °· If it drops below 92% - take 4 puffs of albuterol and if it does not improve then please go to the ER. °· Daily controller medication(s): Symbicort 80mcg 2 puffs twice a day with spacer and  rinse mouth afterwards. °· May use albuterol rescue inhaler 2-4 puffs every 4 to 6 hours as needed for shortness of breath, chest tightness, coughing, and wheezing. May use albuterol rescue inhaler 2 puffs 5 to 15 minutes prior to strenuous physical activities. Monitor frequency of use.  °· Repeat spirometry at next visit - may need to increase Symbicort to 160mcg if breathing is not improved.  °  °Return in about 6 weeks (around 09/11/2020). ° °Assessment and Plan: °Alison Vega is a 39 y.o. female with: °No problem-specific Assessment & Plan notes found for this encounter. ° °Return in about 3 months (around 11/23/2020). ° °Meds ordered this encounter  °Medications  °• omalizumab (XOLAIR) injection 300 mg  ° °Lab Orders  °No laboratory test(s) ordered today  ° ° °Diagnostics: °Spirometry:  °Tracings reviewed. Her effort: {Blank single:19197::"Good reproducible efforts.","It was hard to get consistent efforts and there is a question as to whether this reflects a maximal maneuver.","Poor effort, data can not be interpreted."} °FVC: ***L °FEV1: ***L, ***% predicted °FEV1/FVC ratio: ***% °Interpretation: {Blank single:19197::"Spirometry consistent with mild obstructive disease","Spirometry consistent with moderate obstructive disease","Spirometry consistent with severe obstructive disease","Spirometry consistent with possible restrictive disease","Spirometry consistent with mixed obstructive and restrictive disease","Spirometry uninterpretable due to technique","Spirometry consistent with normal pattern","No overt abnormalities noted given today's efforts"}.  °Please see scanned spirometry results for details. ° °Skin Testing: {Blank single:19197::"Select foods","Environmental allergy panel","Environmental allergy panel and select foods","Food allergy panel","None","Deferred due to recent antihistamines use"}. °Positive test to: ***. Negative test to: ***.  °Results discussed with patient/family. ° ° °Medication List:    °Current Outpatient Medications  °  Medication Sig Dispense Refill   acetaminophen (TYLENOL) 325 MG tablet Take 2 tablets (650 mg total) by mouth every 6 (six) hours.     albuterol (VENTOLIN HFA) 108 (90 Base) MCG/ACT inhaler 1-2 puffs every 4-6 hours as needed and 15 minutes prior to exercise. 18 g 2   budesonide-formoterol (SYMBICORT) 80-4.5 MCG/ACT inhaler Inhale 2 puffs into the lungs in the morning and at bedtime. with spacer and rinse mouth afterwards. 1 each 5   EPINEPHrine 0.3 mg/0.3 mL IJ SOAJ injection Inject 0.3 mLs (0.3 mg total) into the muscle as needed for up to 2 doses for anaphylaxis. 1 each 2   famotidine (PEPCID) 20 MG tablet Take 1 tablet (20 mg total) by mouth 2 (two) times daily as needed for heartburn or indigestion. 60 tablet 5   fluticasone (FLONASE) 50 MCG/ACT nasal spray Place 1-2 sprays into both nostrils daily as needed for allergies or rhinitis. 16 g 5   levocetirizine (XYZAL) 5 MG tablet Take 1 tablet (5 mg total) by mouth daily as needed for allergies. 30 tablet 5   montelukast (SINGULAIR) 10 MG tablet Take 1 tablet (10 mg total) by mouth at bedtime. 30 tablet 5   diphenhydrAMINE (BENADRYL) 25 MG tablet Take 1 tablet (25 mg total) by mouth every 6 (six) hours as needed for up to 30 doses for itching or allergies. (Patient not taking: Reported on 08/23/2020) 30 tablet 0   Current Facility-Administered Medications  Medication Dose Route Frequency Provider Last Rate Last Admin   omalizumab Arvid Right) injection 300 mg  300 mg Subcutaneous Q28 days Garnet Sierras, DO   300 mg at 08/23/20 1208   Allergies: Allergies  Allergen Reactions   Penicillins Shortness Of Breath and Other (See Comments)    Shut patients throat.   Sulfa Antibiotics Shortness Of Breath and Other (See Comments)    Shuts patient's throat.   Avocado Hives   I reviewed her past medical history, social history, family history, and environmental history and no significant changes have been  reported from her previous visit.  Review of Systems  Constitutional: Negative for appetite change, chills, fever and unexpected weight change.  HENT: Negative for congestion and rhinorrhea.   Eyes: Negative for itching.  Respiratory: Positive for shortness of breath. Negative for cough, chest tightness and wheezing.   Gastrointestinal: Negative for abdominal pain.  Skin: Positive for rash.  Allergic/Immunologic: Negative for environmental allergies and food allergies.  Neurological: Negative for headaches.   Objective: BP 120/80    Pulse 82    Temp 98.1 F (36.7 C) (Temporal)    Resp 18    SpO2 97%  There is no height or weight on file to calculate BMI. Physical Exam Vitals and nursing note reviewed.  Constitutional:      Appearance: Normal appearance. She is well-developed.  HENT:     Head: Normocephalic and atraumatic.     Right Ear: Tympanic membrane and external ear normal.     Left Ear: Tympanic membrane and external ear normal.     Nose: Nose normal.     Mouth/Throat:     Mouth: Mucous membranes are moist.     Pharynx: Oropharynx is clear.  Eyes:     Conjunctiva/sclera: Conjunctivae normal.  Cardiovascular:     Rate and Rhythm: Normal rate and regular rhythm.     Heart sounds: Normal heart sounds. No murmur heard.  No friction rub. No gallop.   Pulmonary:     Effort: Pulmonary effort is normal.  Breath sounds: Normal breath sounds. No wheezing or rales.  °Musculoskeletal:  °   Cervical back: Neck supple.  °Skin: °   General: Skin is warm.  °   Findings: No rash.  °Neurological:  °   Mental Status: She is alert and oriented to person, place, and time.  °Psychiatric:     °   Behavior: Behavior normal.  ° ° °Previous notes and tests were reviewed. °The plan was reviewed with the patient/family, and all questions/concerned were addressed. ° °It was my pleasure to see Amaiah today and participate in her care. Please feel free to contact me with any questions or  concerns. ° °Sincerely, ° ° , DO °Allergy & Immunology ° °[facility] °[place] office: [phone] °[place] office: [phone] °

## 2020-08-23 ENCOUNTER — Other Ambulatory Visit: Payer: Self-pay

## 2020-08-23 ENCOUNTER — Ambulatory Visit (INDEPENDENT_AMBULATORY_CARE_PROVIDER_SITE_OTHER): Payer: BC Managed Care – PPO | Admitting: Allergy

## 2020-08-23 ENCOUNTER — Encounter: Payer: Self-pay | Admitting: Allergy

## 2020-08-23 VITALS — BP 120/80 | HR 82 | Temp 98.1°F | Resp 18

## 2020-08-23 DIAGNOSIS — L500 Allergic urticaria: Secondary | ICD-10-CM

## 2020-08-23 DIAGNOSIS — J310 Chronic rhinitis: Secondary | ICD-10-CM

## 2020-08-23 DIAGNOSIS — J4530 Mild persistent asthma, uncomplicated: Secondary | ICD-10-CM | POA: Diagnosis not present

## 2020-08-23 MED ORDER — OMALIZUMAB 150 MG ~~LOC~~ SOLR
300.0000 mg | SUBCUTANEOUS | Status: DC
  Administered 2020-08-23 – 2021-03-12 (×8): 300 mg via SUBCUTANEOUS

## 2020-08-23 NOTE — Patient Instructions (Addendum)
Recurrent urticaria  Start Xolair injections 300mg  every 4 weeks - sample dose given today.   Reviewed risks and benefits. Consent was signed.   I have prescribed epinephrine injectable and demonstrated proper use. For mild symptoms you can take over the counter antihistamines such as Benadryl and monitor symptoms closely. If symptoms worsen or if you have severe symptoms including breathing issues, throat closure, significant swelling, whole body hives, severe diarrhea and vomiting, lightheadedness then inject epinephrine and seek immediate medical care afterwards.  Continue famotidine (Pepcid) 20mg  twice a day.  Continue Xyzal 1 pill twice a day. Continue Singulair (montelukast) 10mg  daily at night. Keep track of hives.   Chronic rhinitis  Continue fluticasone nasal spray, 1 spray per nostril 1-2 times daily as needed.  Continue Singulair (montelukast) 10mg  daily at night.  Nasal saline lavage (NeilMed) has been recommended as needed and prior to medicated nasal sprays along with instructions for proper administration.  Mild persistent asthma Monitor pulse ox.  If it drops below 92% - take 4 puffs of albuterol and if it does not improve then please go to the ER. Daily controller medication(s): Symbicort 2 puffs twice a day with spacer and rinse mouth afterwards. May use albuterol rescue inhaler 2-4 puffs every 4 to 6 hours as needed for shortness of breath, chest tightness, coughing, and wheezing. May use albuterol rescue inhaler 2 puffs 5 to 15 minutes prior to strenuous physical activities. Monitor frequency of use.  Asthma control goals:  Full participation in all desired activities (may need albuterol before activity) Albuterol use two times or less a week on average (not counting use with activity) Cough interfering with sleep two times or less a month Oral steroids no more than once a year No hospitalizations  Follow up in 3 months or sooner if needed.

## 2020-08-23 NOTE — Progress Notes (Signed)
Follow Up Note  RE: Alison Vega MRN: [id number] DOB: 04-15-1981 Date of Office Visit: 08/23/2020  Referring provider: No ref. provider found Primary care provider: Patient, No Pcp Per  Chief Complaint: Urticaria  History of Present Illness: I had the pleasure of seeing Alison Vega for a follow up visit at [facility] on 08/23/2020. She is a 39 y.o. female, who is being followed for urticaria, chronic rhinitis, and mild persistent asthma. Her previous allergy office visit was on 07/31/2020 with Dr. Selena Batten. Today is a regular follow up visit.  Ms. Alison Vega states that her urticaria has only minimally improved on Xyzal BID, Pepcid 20mg  BID, and Montelukast daily. She states that she continues to break out in hives diffusely about her body, currently on her bilateral arms, back, and chest. She states this past weekend, she had a flare up for which she took 5 Xyzal pills and 2 other allergy pills she is unable to name. She endorses a bumpy rash with significant itchiness and denies pain. She states her rashes have become less read but spread more diffusely about her body, and she is unable to identify any particular cause. She states her last skin allergy testing was in 09/2019 and showed no clear triggers.  She states that her asthma has been under relatively good control with Symbicort 10/2019 2 puffs BID and rescue Ventolin inhaler. She believes dust and exertion (especially at work in a yard) are her biggest triggers. She has picked up a finger pulse oximeter but continues to prefer checking her pulse oximetry via bluetooth on her phone, and her O2 saturations haven't dropped below 97%. She states that she uses her inhaler preemptively before going to work and needs her inhaler every few days once or twice in addition. She feels her breathing is under good control currently.   She also feels that her chronic rhinitis is under good control with Flonase once daily and  Montelukast. She denies any rhinorrhea, nasal congestion, headaches, loss of smell, fevers, chills, changes in appetite, bowel habits or urination.  Assessment and Plan: Jamileth is a 39 y.o. female with: Urticaria, Chronic Rhinitis, and Mild Persistent Asthma   # Urticaria [name] states that her urticaria has only minimally improved on Xyzal BID, Pepcid 20mg  BID, and Montelukast daily. She states that she continues to break out in hives diffusely about her body, currently on her bilateral arms, back, and chest. Her hives have spread more diffusely about her body, and she is unable to identify any particular cause. Urticarial index, tryptase, and other labs from last visit unremarkable. She states her last skin allergy testing was in 09/2019 and showed no clear triggers. - Will start Xolair 300mg  every 4 weeks injections today (sample dose given); continue Xolair injections in the office given patient preference. Consent was signed.  - Continue Xyzal 5mg  twice daily - Continue Pepcid 20mg  twice daily - Continue Montelukast 10mg  nightly - Keep track of hives.   # Mild Persistent Asthma She states that her asthma has been under relatively good control with Symbicort 2 puffs BID and rescue Ventolin inhaler. She believes dust and exertion (especially at work in a 10/2019 yard) are her biggest triggers. She has picked up a finger pulse oximeter but continues to prefer checking her pulse oximetry via bluetooth on her phone, and her O2 saturations haven't dropped below 97%. She states that she uses her inhaler preemptively before going to work and needs her inhaler every  few days once or twice in addition. She feels her breathing is under good control currently. Lungs CTA on examination with negative CXR in September 2021. - Continue Symbicort 2 puffs twice daily - Continue Ventolin HFA inhaler 2 puffs q5-6 hours PRN  - Continue Montelukast 10mg  nightly - Continue to use pulse oximeter and  give albuterol x 4 puffs if <92%, instructed patient to go to the ED if O2 saturations do not improve with albuterol  # Chronic Rhinitis She also feels that her chronic rhinitis is under good control with Flonase once daily and Montelukast. She denies any rhinorrhea, nasal congestion, headaches, loss of smell, ocular itching or drainage.  - Continue Flonase once daily  - Continue nasal saline lavage prior to sprays as able   Follow up: 3 months  Diagnostics: None.  Medication List:  Current Outpatient Medications  Medication Sig Dispense Refill  . acetaminophen (TYLENOL) 325 MG tablet Take 2 tablets (650 mg total) by mouth every 6 (six) hours.    albuterol (VENTOLIN HFA) 108 (90 Base) MCG/ACT inhaler 1-2 puffs every 4-6 hours as needed and 15 minutes prior to exercise. 18 g 2  . budesonide-formoterol (SYMBICORT) 80-4.5 MCG/ACT inhaler Inhale 2 puffs into the lungs in the morning and at bedtime. with spacer and rinse mouth afterwards. 1 each 5  . EPINEPHrine 0.3 mg/0.3 mL IJ SOAJ injection Inject 0.3 mLs (0.3 mg total) into the muscle as needed for up to 2 doses for anaphylaxis. 1 each 2  . famotidine (PEPCID) 20 MG tablet Take 1 tablet (20 mg total) by mouth 2 (two) times daily as needed for heartburn or indigestion. 60 tablet 5  . fluticasone (FLONASE) 50 MCG/ACT nasal spray Place 1-2 sprays into both nostrils daily as needed for allergies or rhinitis. 16 g 5  . levocetirizine (XYZAL) 5 MG tablet Take 1 tablet (5 mg total) by mouth daily as needed for allergies. 30 tablet 5  . montelukast (SINGULAIR) 10 MG tablet Take 1 tablet (10 mg total) by mouth at bedtime. 30 tablet 5  . diphenhydrAMINE (BENADRYL) 25 MG tablet Take 1 tablet (25 mg total) by mouth every 6 (six) hours as needed for up to 30 doses for itching or allergies. (Patient not taking: Reported on 08/23/2020) 30 tablet 0   Current Facility-Administered Medications  Medication Dose Route Frequency Provider Last Rate Last Admin    . omalizumab 08/25/2020) injection 300 mg  300 mg Subcutaneous Q28 days [name], DO   300 mg at 08/23/20 1208   Allergies: Allergies  Allergen Reactions  . Penicillins Shortness Of Breath and Other (See Comments)    Shut patients throat.  . Sulfa Antibiotics Shortness Of Breath and Other (See Comments)    Shuts patient's throat.  1209 Avocado Hives   I reviewed her past medical history, social history, family history, and environmental history and no significant changes have been reported from her previous visit.  Review of Systems  Constitutional: Negative for appetite change, chills, fever and unexpected weight change.  HENT: Negative for congestion and rhinorrhea.   Eyes: Negative for itching.  Respiratory: Positive for shortness of breath. Negative for cough, chest tightness and wheezing.   Gastrointestinal: Negative for abdominal pain.  Skin: Positive for rash.  Allergic/Immunologic: Negative for environmental allergies and food allergies.  Neurological: Negative for headaches.   Objective: BP 120/80   Pulse 82   Temp 98.1 F (36.7 C) (Temporal)   Resp 18   SpO2 97%  There is  no height or weight on file to calculate BMI. Physical Exam  General: Patient appears well. No acute distress. Eyes: Sclera non-icteric. No conjunctival injection. There is lateral deviation of the left eye.  HENT: Nasal turbinates with only mild erythema and edema, no discharge or crusting. No posterior oropharyngeal erythema. There are chronic scarring changes of the left TM; otherwise, TM's appear clear bilaterally without erythema or bulging.  Respiratory: Lungs are CTA, bilaterally. No wheezes, rales, or rhonchi.  Cardiovascular: Regular rate and rhythm. No murmurs, rubs, or gallops.  Skin: There are erythematous papules along the bilateral forearms around the elbow area, lower back, and chest, with excoriation. No other lesions or rashes noted, warm, dry, and intact without drainage. Psych:  Patient appears mildly anxious. Pleasant and cooperative.   Previous notes and tests were reviewed. The plan was reviewed with the patient/family, and all questions/concerned were addressed.  It was my pleasure to see [facility] today and participate in her care. Please feel free to contact our office with any questions or concerns.  Sincerely,  Glenford Bayley, MD 08/23/2020, 1:19 PM Pager: [phone]  I performed a history and physical examination of the patient and discussed her management with the Resident. I reviewed the note and agree with the documented findings and plan of care. The note in its entirety was edited by myself, including the physical exam, assessment, and plan.   Wyline Mood, DO [facility] office: [phone] [facility] office: [phone]

## 2020-09-11 ENCOUNTER — Ambulatory Visit: Payer: BC Managed Care – PPO | Admitting: Allergy

## 2020-09-11 NOTE — Progress Notes (Deleted)
Follow Up Note  RE: Alison Vega MRN: [id number] DOB: Mar 04, 1981 Date of Office Visit: 09/11/2020  Referring provider: No ref. provider found Primary care provider: Patient, No Pcp Per  Chief Complaint: No chief complaint on file.  History of Present Illness: I had the pleasure of seeing Alison Vega for a follow up visit at the [facility] on 09/11/2020. She is a 39 y.o. female, who is being followed for urticaria on Xolair 300mg  every 4 weeks, asthma and chronic rhinitis. Her previous allergy office visit was on 08/23/2020 with Dr. 08/25/2020. Today is a regular follow up visit.  # Urticaria Alison Vega states that her urticaria has only minimally improved on Xyzal BID, Pepcid 20mg  BID, and Montelukast daily. She states that she continues to break out in hives diffusely about her body, currently on her bilateral arms, back, and chest. Her hives have spread more diffusely about her body, and she is unable to identify any particular cause. Urticarial index, tryptase, and other labs from last visit unremarkable. She states her last skin allergy testing was in 09/2019 and showed no clear triggers. - Will start Xolair 300mg  every 4 weeks injections today (sample dose given); continue Xolair injections in the office given patient preference. Consent was signed.  - Continue Xyzal 5mg  twice daily - Continue Pepcid 20mg  twice daily - Continue Montelukast 10mg  nightly - Keep track of hives.   # Mild Persistent Asthma She states that her asthma has been under relatively good control with Symbicort 2 puffs BID and rescue Ventolin inhaler. She believes dust and exertion (especially at work in a 10/2019 yard) are her biggest triggers. She has picked up a finger pulse oximeter but continues to prefer checking her pulse oximetry via bluetooth on her phone, and her O2 saturations haven't dropped below 97%. She states that she uses her inhaler preemptively before going to work and needs her  inhaler every few days once or twice in addition. She feels her breathing is under good control currently. Lungs CTA on examination with negative CXR in September 2021. - Continue Symbicort 2 puffs twice daily - Continue Ventolin HFA inhaler 2 puffs q5-6 hours PRN  - Continue Montelukast 10mg  nightly - Continue to use pulse oximeter and give albuterol x 4 puffs if <92%, instructed patient to go to the ED if O2 saturations do not improve with albuterol  # Chronic Rhinitis She also feels that her chronic rhinitis is under good control with Flonase once daily and Montelukast. She denies any rhinorrhea, nasal congestion, headaches, loss of smell, ocular itching or drainage.  - Continue Flonase once daily  - Continue nasal saline lavage prior to sprays as able   Follow up: 3 months  Assessment and Plan: Alison Vega is a 39 y.o. female with: No problem-specific Assessment & Plan notes found for this encounter.  No follow-ups on file.  No orders of the defined types were placed in this encounter.  Lab Orders  No laboratory test(s) ordered today    Diagnostics: Spirometry:  Tracings reviewed. Her effort: {Blank single:19197::"Good reproducible efforts.","It was hard to get consistent efforts and there is a question as to whether this reflects a maximal maneuver.","Poor effort, data can not be interpreted."} FVC: ***L FEV1: ***L, ***% predicted FEV1/FVC ratio: ***% Interpretation: {Blank single:19197::"Spirometry consistent with mild obstructive disease","Spirometry consistent with moderate obstructive disease","Spirometry consistent with severe obstructive disease","Spirometry consistent with possible restrictive disease","Spirometry consistent with mixed obstructive and restrictive disease","Spirometry uninterpretable due to technique","Spirometry consistent with  normal pattern","No overt abnormalities noted given today's efforts"}.  Please see scanned spirometry results for  details.  Skin Testing: {Blank single:19197::"Select foods","Environmental allergy panel","Environmental allergy panel and select foods","Food allergy panel","None","Deferred due to recent antihistamines use"}. Positive test to: ***. Negative test to: ***.  Results discussed with patient/family.   Medication List:  Current Outpatient Medications  Medication Sig Dispense Refill  . acetaminophen (TYLENOL) 325 MG tablet Take 2 tablets (650 mg total) by mouth every 6 (six) hours.    Marland Kitchen albuterol (VENTOLIN HFA) 108 (90 Base) MCG/ACT inhaler 1-2 puffs every 4-6 hours as needed and 15 minutes prior to exercise. 18 g 2  . budesonide-formoterol (SYMBICORT) 80-4.5 MCG/ACT inhaler Inhale 2 puffs into the lungs in the morning and at bedtime. with spacer and rinse mouth afterwards. 1 each 5  . diphenhydrAMINE (BENADRYL) 25 MG tablet Take 1 tablet (25 mg total) by mouth every 6 (six) hours as needed for up to 30 doses for itching or allergies. (Patient not taking: Reported on 08/23/2020) 30 tablet 0  . EPINEPHrine 0.3 mg/0.3 mL IJ SOAJ injection Inject 0.3 mLs (0.3 mg total) into the muscle as needed for up to 2 doses for anaphylaxis. 1 each 2  . famotidine (PEPCID) 20 MG tablet Take 1 tablet (20 mg total) by mouth 2 (two) times daily as needed for heartburn or indigestion. 60 tablet 5  . fluticasone (FLONASE) 50 MCG/ACT nasal spray Place 1-2 sprays into both nostrils daily as needed for allergies or rhinitis. 16 g 5  . levocetirizine (XYZAL) 5 MG tablet Take 1 tablet (5 mg total) by mouth daily as needed for allergies. 30 tablet 5  . montelukast (SINGULAIR) 10 MG tablet Take 1 tablet (10 mg total) by mouth at bedtime. 30 tablet 5   Current Facility-Administered Medications  Medication Dose Route Frequency Provider Last Rate Last Admin  . omalizumab [name]) injection 300 mg  300 mg Subcutaneous Q28 days Ellamae Sia, DO   300 mg at 08/23/20 1208   Allergies: Allergies  Allergen Reactions  . Penicillins  Shortness Of Breath and Other (See Comments)    Shut patients throat.  . Sulfa Antibiotics Shortness Of Breath and Other (See Comments)    Shuts patient's throat.  Marland Kitchen Avocado Hives   I reviewed her past medical history, social history, family history, and environmental history and no significant changes have been reported from her previous visit.  Review of Systems  Constitutional: Negative for appetite change, chills, fever and unexpected weight change.  HENT: Negative for congestion and rhinorrhea.   Eyes: Negative for itching.  Respiratory: Positive for shortness of breath. Negative for cough, chest tightness and wheezing.   Gastrointestinal: Negative for abdominal pain.  Skin: Positive for rash.  Allergic/Immunologic: Negative for environmental allergies and food allergies.  Neurological: Negative for headaches.   Objective: There were no vitals taken for this visit. There is no height or weight on file to calculate BMI. Physical Exam Vitals and nursing note reviewed.  Constitutional:      Appearance: Normal appearance. She is well-developed.  HENT:     Head: Normocephalic and atraumatic.     Right Ear: Tympanic membrane and external ear normal.     Left Ear: Tympanic membrane and external ear normal.     Nose: Nose normal.     Mouth/Throat:     Mouth: Mucous membranes are moist.     Pharynx: Oropharynx is clear.  Eyes:     Conjunctiva/sclera: Conjunctivae normal.  Cardiovascular:  Rate and Rhythm: Normal rate and regular rhythm.     Heart sounds: Normal heart sounds. No murmur heard.  No friction rub. No gallop.   Pulmonary:     Effort: Pulmonary effort is normal.     Breath sounds: Normal breath sounds. No wheezing or rales.  Musculoskeletal:     Cervical back: Neck supple.  Skin:    General: Skin is warm.     Findings: No rash.  Neurological:     Mental Status: She is alert and oriented to person, place, and time.  Psychiatric:        Behavior: Behavior  normal.    Previous notes and tests were reviewed. The plan was reviewed with the patient/family, and all questions/concerned were addressed.  It was my pleasure to see [facility] today and participate in her care. Please feel free to contact me with any questions or concerns.  Sincerely,  Wyline Mood, DO Allergy & Immunology  [facility] office: [phone] [facility] office: [phone]

## 2020-09-20 ENCOUNTER — Ambulatory Visit (INDEPENDENT_AMBULATORY_CARE_PROVIDER_SITE_OTHER): Payer: BC Managed Care – PPO

## 2020-09-20 ENCOUNTER — Other Ambulatory Visit: Payer: Self-pay

## 2020-09-20 DIAGNOSIS — L501 Idiopathic urticaria: Secondary | ICD-10-CM | POA: Diagnosis not present

## 2020-10-16 NOTE — Progress Notes (Signed)
Have been working on approval and reach out to [facility] and they advised same has been approved for buy and bill. Patient to bring copy of approval when she comes in for injections 12/16. Explained buy and bill and will do copay card

## 2020-10-17 DIAGNOSIS — L501 Idiopathic urticaria: Secondary | ICD-10-CM | POA: Diagnosis not present

## 2020-10-18 ENCOUNTER — Ambulatory Visit (INDEPENDENT_AMBULATORY_CARE_PROVIDER_SITE_OTHER): Payer: BC Managed Care – PPO

## 2020-10-18 ENCOUNTER — Other Ambulatory Visit: Payer: Self-pay

## 2020-10-18 DIAGNOSIS — L501 Idiopathic urticaria: Secondary | ICD-10-CM

## 2020-11-14 DIAGNOSIS — L501 Idiopathic urticaria: Secondary | ICD-10-CM | POA: Diagnosis not present

## 2020-11-15 ENCOUNTER — Other Ambulatory Visit: Payer: Self-pay

## 2020-11-15 ENCOUNTER — Ambulatory Visit (INDEPENDENT_AMBULATORY_CARE_PROVIDER_SITE_OTHER): Payer: BC Managed Care – PPO

## 2020-11-15 DIAGNOSIS — L501 Idiopathic urticaria: Secondary | ICD-10-CM

## 2020-12-13 ENCOUNTER — Ambulatory Visit: Payer: Self-pay

## 2020-12-17 DIAGNOSIS — L501 Idiopathic urticaria: Secondary | ICD-10-CM | POA: Diagnosis not present

## 2020-12-18 ENCOUNTER — Other Ambulatory Visit: Payer: Self-pay

## 2020-12-18 ENCOUNTER — Ambulatory Visit (INDEPENDENT_AMBULATORY_CARE_PROVIDER_SITE_OTHER): Payer: BC Managed Care – PPO

## 2020-12-18 DIAGNOSIS — L501 Idiopathic urticaria: Secondary | ICD-10-CM

## 2021-01-14 DIAGNOSIS — L501 Idiopathic urticaria: Secondary | ICD-10-CM | POA: Diagnosis not present

## 2021-01-15 ENCOUNTER — Other Ambulatory Visit: Payer: Self-pay

## 2021-01-15 ENCOUNTER — Ambulatory Visit (INDEPENDENT_AMBULATORY_CARE_PROVIDER_SITE_OTHER): Payer: BC Managed Care – PPO

## 2021-01-15 DIAGNOSIS — L501 Idiopathic urticaria: Secondary | ICD-10-CM

## 2021-02-11 DIAGNOSIS — L501 Idiopathic urticaria: Secondary | ICD-10-CM | POA: Diagnosis not present

## 2021-02-12 ENCOUNTER — Ambulatory Visit (INDEPENDENT_AMBULATORY_CARE_PROVIDER_SITE_OTHER): Payer: BC Managed Care – PPO

## 2021-02-12 ENCOUNTER — Other Ambulatory Visit: Payer: Self-pay

## 2021-02-12 DIAGNOSIS — L501 Idiopathic urticaria: Secondary | ICD-10-CM | POA: Diagnosis not present

## 2021-03-11 DIAGNOSIS — L501 Idiopathic urticaria: Secondary | ICD-10-CM | POA: Diagnosis not present

## 2021-03-12 ENCOUNTER — Other Ambulatory Visit: Payer: Self-pay

## 2021-03-12 ENCOUNTER — Ambulatory Visit (INDEPENDENT_AMBULATORY_CARE_PROVIDER_SITE_OTHER): Payer: BC Managed Care – PPO

## 2021-03-12 DIAGNOSIS — L501 Idiopathic urticaria: Secondary | ICD-10-CM | POA: Diagnosis not present

## 2021-03-15 ENCOUNTER — Telehealth: Payer: Self-pay | Admitting: *Deleted

## 2021-03-15 NOTE — Telephone Encounter (Signed)
L/M for patient to contact office for MD appt for reapproval for Xolair

## 2021-03-30 ENCOUNTER — Encounter: Payer: Self-pay | Admitting: Allergy

## 2021-04-09 ENCOUNTER — Ambulatory Visit: Payer: BC Managed Care – PPO

## 2021-04-09 ENCOUNTER — Other Ambulatory Visit: Payer: Self-pay

## 2021-04-16 ENCOUNTER — Encounter (HOSPITAL_COMMUNITY): Payer: Self-pay

## 2021-04-16 ENCOUNTER — Emergency Department (HOSPITAL_COMMUNITY)
Admission: EM | Admit: 2021-04-16 | Discharge: 2021-04-17 | Disposition: A | Discharge status: Home / Self Care | Payer: BC Managed Care – PPO | Attending: Emergency Medicine | Admitting: Emergency Medicine

## 2021-04-16 ENCOUNTER — Other Ambulatory Visit: Payer: Self-pay

## 2021-04-16 DIAGNOSIS — Z7951 Long term (current) use of inhaled steroids: Secondary | ICD-10-CM | POA: Diagnosis not present

## 2021-04-16 DIAGNOSIS — Y92513 Shop (commercial) as the place of occurrence of the external cause: Secondary | ICD-10-CM | POA: Diagnosis not present

## 2021-04-16 DIAGNOSIS — W902XXA Exposure to laser radiation, initial encounter: Secondary | ICD-10-CM | POA: Insufficient documentation

## 2021-04-16 DIAGNOSIS — J4530 Mild persistent asthma, uncomplicated: Secondary | ICD-10-CM | POA: Diagnosis not present

## 2021-04-16 DIAGNOSIS — S0592XA Unspecified injury of left eye and orbit, initial encounter: Secondary | ICD-10-CM | POA: Diagnosis not present

## 2021-04-16 DIAGNOSIS — S0502XA Injury of conjunctiva and corneal abrasion without foreign body, left eye, initial encounter: Secondary | ICD-10-CM | POA: Diagnosis not present

## 2021-04-16 DIAGNOSIS — Y990 Civilian activity done for income or pay: Secondary | ICD-10-CM | POA: Insufficient documentation

## 2021-04-16 MED ORDER — TETRACAINE HCL 0.5 % OP SOLN: 2.0000 [drp] | Freq: Once | OPHTHALMIC | Status: DC

## 2021-04-16 MED ORDER — FLUORESCEIN SODIUM 1 MG OP STRP: 1.0000 | ORAL_STRIP | Freq: Once | OPHTHALMIC | Status: DC

## 2021-04-16 NOTE — ED Triage Notes (Signed)
Pt reports Left eye drainage/blurriness that started approx 0200 this am while at work. Pt wears a shield and safety glasses while at work. Works in a [employer]

## 2021-04-16 NOTE — ED Provider Notes (Signed)
Emergency Medicine Provider Triage Evaluation Note  Alison Vega , a 40 y.o. female  was evaluated in triage.  Pt complains of eye redness and pain.  Pt works with metal.  She wears safety glasses  Review of Systems  Positive: Blurred vision Negative:   Physical Exam  BP 135/89 (BP Location: Right Arm)   Pulse 61   Temp 98.8 F (37.1 C)   Resp 18   SpO2 99%  Gen:   Awake, no distress  injected left eye, tearing Resp:  Normal effort  MSK:   Moves extremities without difficulty  Other:    Medical Decision Making  Medically screening exam initiated at 7:28 PM.  Appropriate orders placed.  Alison Vega was informed that the remainder of the evaluation will be completed by another provider, this initial triage assessment does not replace that evaluation, and the importance of remaining in the ED until their evaluation is complete.     Elson Areas, Cordelia Poche 04/16/21 1929    Gwyneth Sprout, MD 04/19/21 1352

## 2021-04-17 DIAGNOSIS — S0502XA Injury of conjunctiva and corneal abrasion without foreign body, left eye, initial encounter: Secondary | ICD-10-CM | POA: Diagnosis not present

## 2021-04-17 MED ORDER — TETRACAINE HCL 0.5 % OP SOLN
2.0000 [drp] | Freq: Once | OPHTHALMIC | Status: DC
  Filled 2021-04-17: qty 4

## 2021-04-17 MED ORDER — FLUORESCEIN SODIUM 1 MG OP STRP
1.0000 | ORAL_STRIP | Freq: Once | OPHTHALMIC | Status: DC
  Filled 2021-04-17: qty 1

## 2021-04-17 MED ORDER — ERYTHROMYCIN 5 MG/GM OP OINT: 1.0000 "application " | TOPICAL_OINTMENT | Freq: Once | OPHTHALMIC | Status: DC

## 2021-04-17 MED ORDER — ERYTHROMYCIN 5 MG/GM OP OINT: TOPICAL_OINTMENT | Freq: Three times a day (TID) | OPHTHALMIC | 0 refills | Status: AC

## 2021-04-17 NOTE — ED Provider Notes (Signed)
[facility] EMERGENCY DEPARTMENT Provider Note   CSN: [id number] Arrival date & time: 04/16/21  1655     History Chief Complaint  Patient presents with   Eye Drainage    Alison Vega is a 40 y.o. female.  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  She states this morning she had acute onset of left eye pain and blurry vision.  She wears safety goggles had a month that time.  She states that she does not have any loss of vision however this has persisted with some tearing for the last almost 24 hours.  So she presents here for further evaluation.  No vision loss.  She has no trauma anywhere else.  No pain anywhere else.       Past Medical History:  Diagnosis Date   Asthma    Cellulitis    cellulitis/abcess x15, "every 2 months"    Patient Active Problem List   Diagnosis Date Noted   Shortness of breath 07/31/2020   Recurrent urticaria 09/13/2019   Angioedema 09/13/2019   Mild persistent asthma without complication 09/13/2019   Chronic rhinitis 09/13/2019    Past Surgical History:  Procedure Laterality Date   CESAREAN SECTION     EYE SURGERY     OPEN REDUCTION INTERNAL FIXATION (ORIF) METACARPAL Right 06/13/2019   Procedure: OPEN TREATMENT OF RIGHT 5TH METACARPAL FRACTURE;  Surgeon: Mack Hook, MD;  Location: [facility];  Service: Orthopedics;  Laterality: Right;   TUBAL LIGATION       OB History   No obstetric history on file.     No family history on file.  Social History   Tobacco Use   Smoking status: Never   Smokeless tobacco: Never  Substance Use Topics   Alcohol use: No   Drug use: No    Home Medications Prior to Admission medications   Medication Sig Start Date End Date Taking? Authorizing Provider  erythromycin ophthalmic ointment Place into the left eye 3 (three) times daily for 7 days. Place a 1/2 inch ribbon of ointment into the lower eyelid. 04/17/21 04/24/21 Yes Youssouf Shipley, Barbara Cower,  MD  acetaminophen (TYLENOL) 325 MG tablet Take 2 tablets (650 mg total) by mouth every 6 (six) hours. 06/13/19   Mack Hook, MD  albuterol (VENTOLIN HFA) 108 (90 Base) MCG/ACT inhaler 1-2 puffs every 4-6 hours as needed and 15 minutes prior to exercise. 09/13/19   Bobbitt, Heywood Iles, MD  budesonide-formoterol (SYMBICORT) 80-4.5 MCG/ACT inhaler Inhale 2 puffs into the lungs in the morning and at bedtime. with spacer and rinse mouth afterwards. 06/26/20   Ellamae Sia, DO  diphenhydrAMINE (BENADRYL) 25 MG tablet Take 1 tablet (25 mg total) by mouth every 6 (six) hours as needed for up to 30 doses for itching or allergies. Patient not taking: Reported on 08/23/2020 09/04/19   Terald Sleeper, MD  EPINEPHrine 0.3 mg/0.3 mL IJ SOAJ injection Inject 0.3 mLs (0.3 mg total) into the muscle as needed for up to 2 doses for anaphylaxis. 09/04/19   Terald Sleeper, MD  famotidine (PEPCID) 20 MG tablet Take 1 tablet (20 mg total) by mouth 2 (two) times daily as needed for heartburn or indigestion. 06/26/20   Ellamae Sia, DO  fluticasone (FLONASE) 50 MCG/ACT nasal spray Place 1-2 sprays into both nostrils daily as needed for allergies or rhinitis. 07/31/20   Ellamae Sia, DO  levocetirizine (XYZAL) 5 MG tablet Take 1 tablet (5 mg  total) by mouth daily as needed for allergies. 09/13/19   Bobbitt, Heywood Iles, MD  montelukast (SINGULAIR) 10 MG tablet Take 1 tablet (10 mg total) by mouth at bedtime. 06/26/20   Ellamae Sia, DO    Allergies    Penicillins, Sulfa antibiotics, and Avocado  Review of Systems   Review of Systems  All other systems reviewed and are negative.  Physical Exam Updated Vital Signs BP 140/90   Pulse 70   Temp 98.8 F (37.1 C)   Resp 14   Ht 5\' 1"  (1.549 m)   Wt 68 kg   SpO2 100%   BMI 28.34 kg/m   Physical Exam Vitals and nursing note reviewed.  Constitutional:      Appearance: She is well-developed.  HENT:     Head: Normocephalic and atraumatic.     Mouth/Throat:      Mouth: Mucous membranes are moist.     Pharynx: Oropharynx is clear.  Eyes:     General:        Left eye: Discharge (clear) present.    Extraocular Movements: Extraocular movements intact.     Pupils: Pupils are equal, round, and reactive to light.     Comments: Left eye injected Right eye strabismus  Fluoroscein uptake on woods lamp evaluation of left eye just inferior to the pupil  IOP: 20 L, 23 R  Cardiovascular:     Rate and Rhythm: Normal rate and regular rhythm.  Pulmonary:     Effort: No respiratory distress.     Breath sounds: No stridor.  Abdominal:     General: There is no distension.  Musculoskeletal:     Cervical back: Normal range of motion.  Skin:    General: Skin is warm and dry.  Neurological:     General: No focal deficit present.     Mental Status: She is alert.    ED Results / Procedures / Treatments   Labs (all labs ordered are listed, but only abnormal results are displayed) Labs Reviewed - No data to display  EKG None  Radiology No results found.  Procedures Procedures   Medications Ordered in ED Medications  tetracaine (PONTOCAINE) 0.5 % ophthalmic solution 2 drop (has no administration in time range)  fluorescein ophthalmic strip 1 strip (has no administration in time range)  tetracaine (PONTOCAINE) 0.5 % ophthalmic solution 2 drop (has no administration in time range)  fluorescein ophthalmic strip 1 strip (has no administration in time range)  erythromycin ophthalmic ointment 1 application (has no administration in time range)    ED Course  I have reviewed the triage vital signs and the nursing notes.  Pertinent labs & imaging results that were available during my care of the patient were reviewed by me and considered in my medical decision making (see chart for details).    MDM Rules/Calculators/A&P                          Exam c/w small corneal abrasion. Ointment given here, will do it at home. Fu w/ ophtho if not improving.    Final Clinical Impression(s) / ED Diagnoses Final diagnoses:  Abrasion of left cornea, initial encounter    Rx / DC Orders ED Discharge Orders          Ordered    erythromycin ophthalmic ointment  3 times daily        04/17/21 0140  Marily Memos, MD 04/17/21 [phone]

## 2021-04-17 NOTE — ED Notes (Signed)
Provider at the bedside.  

## 2021-04-17 NOTE — ED Notes (Signed)
Pt has visitor at the bedside.

## 2021-04-22 DIAGNOSIS — T1500XA Foreign body in cornea, unspecified eye, initial encounter: Secondary | ICD-10-CM | POA: Diagnosis not present

## 2021-05-10 IMAGING — RF RIGHT HAND - COMPLETE 3+ VIEW
1 series · 4 of 4 positions shown · non-contrast
Comparison: Radiographs June 03, 2019.

CLINICAL DATA: Status post open reduction and internal fixation of
right fifth metacarpal fracture.

EXAM:
RIGHT HAND - COMPLETE 3+ VIEW; DG C-ARM 61-120 MIN
FLUOROSCOPY TIME:  12 seconds.

[Series 1: run · 4 of 4 slices shown]
[im 1/4]
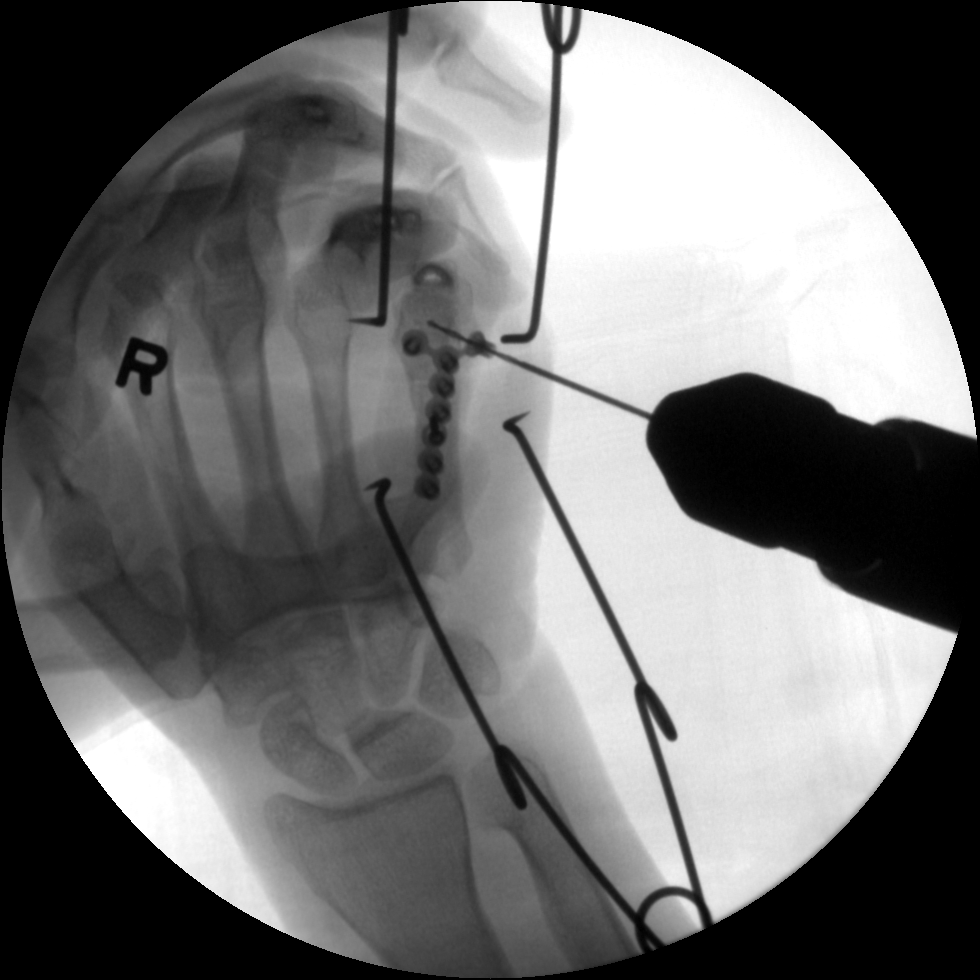
[im 2/4]
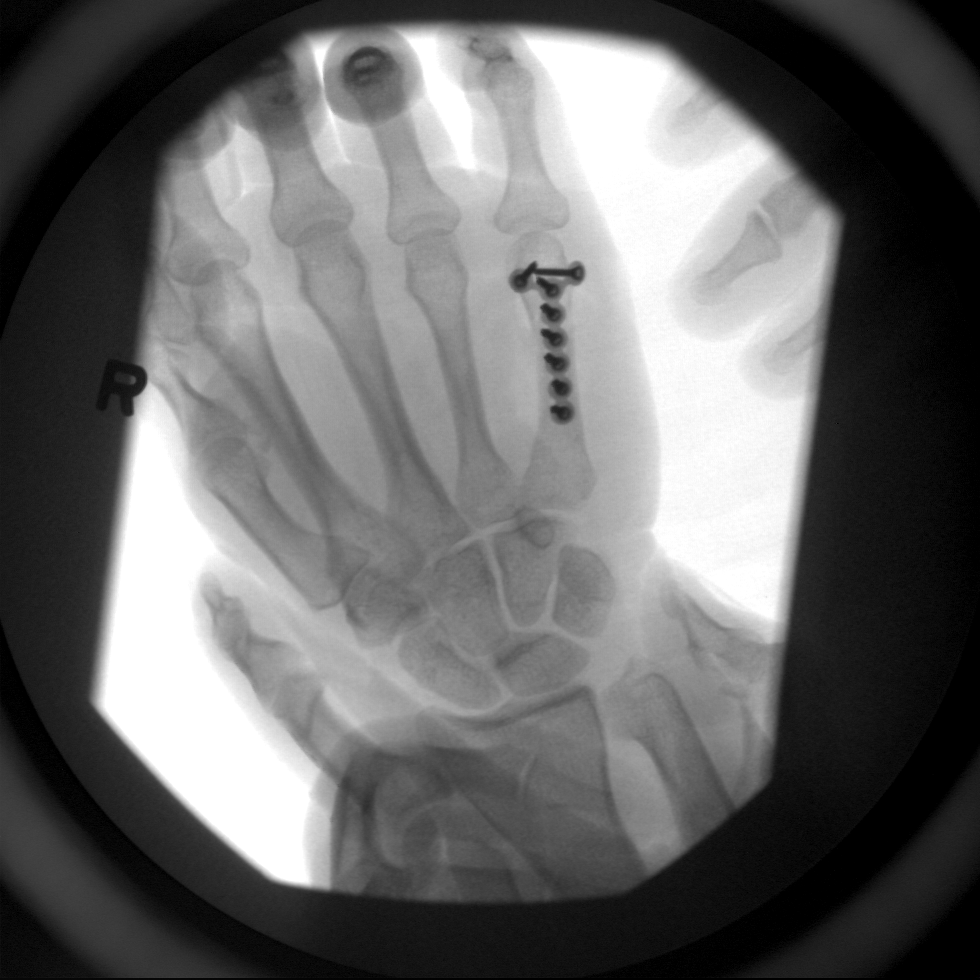
[im 3/4]
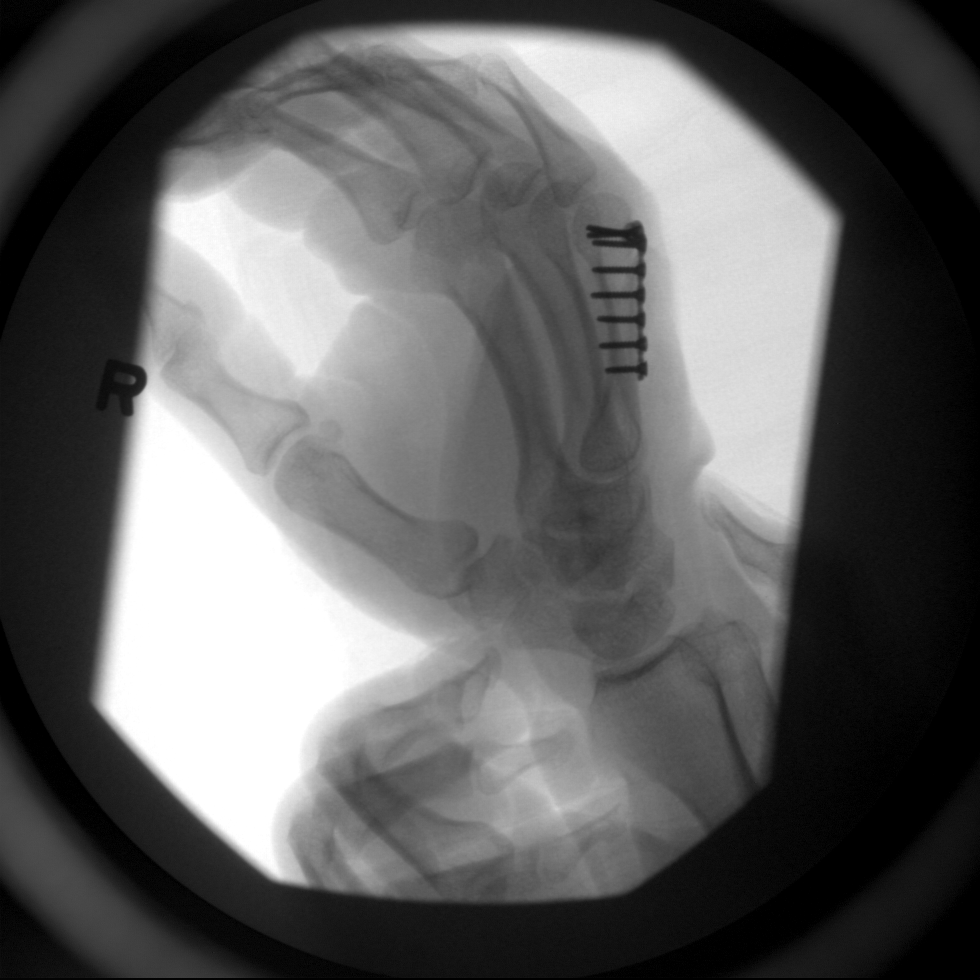
[im 4/4]
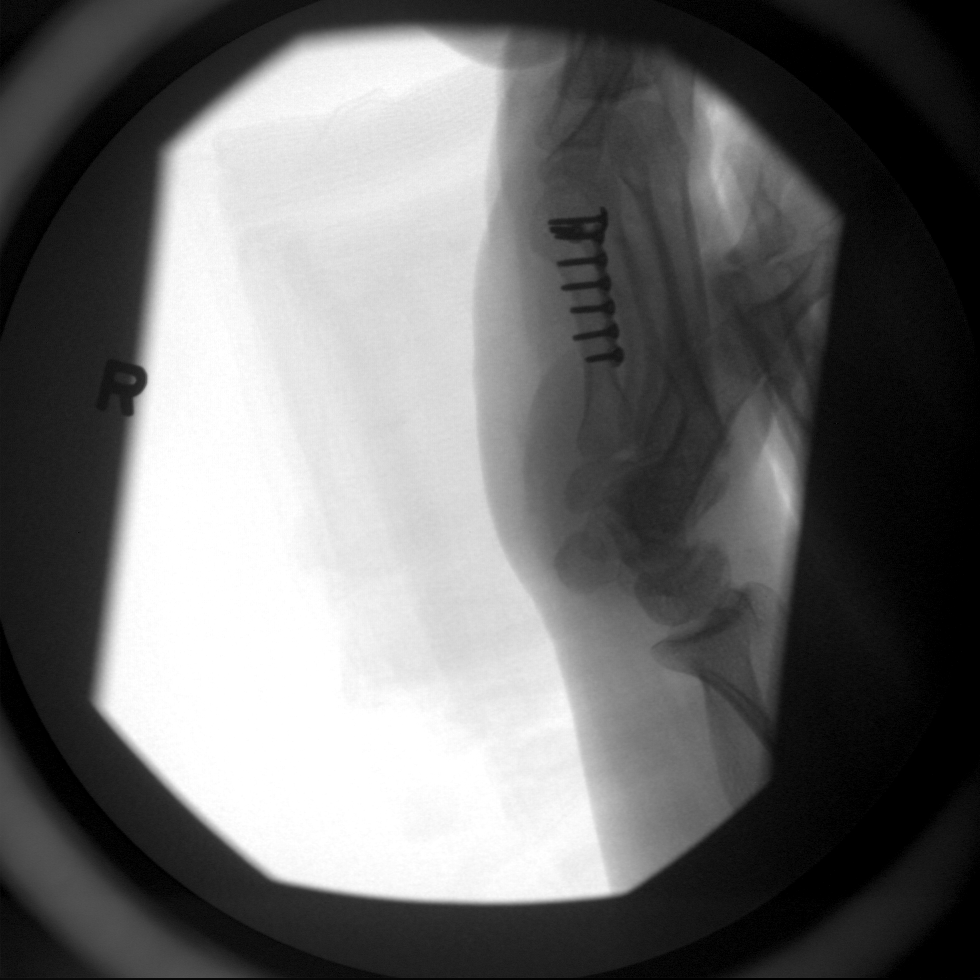

[4 of 4 positions shown; findings below may reference images not displayed]

FINDINGS: Four intraoperative fluoroscopic images were obtained of the right
hand. Status post surgical internal fixation of fifth metacarpal
fracture. Good alignment of fracture components is noted.
IMPRESSION: Fluoroscopic guidance provided during open reduction and internal
fixation of right fifth metacarpal fracture.

## 2021-05-29 NOTE — Progress Notes (Signed)
Follow Up Note  RE: Derrisha Foos MRN: [id number] DOB: Jun 03, 1981 Date of Office Visit: 05/30/2021  Referring provider: No ref. provider found Primary care provider: Patient, No Pcp Per (Inactive)  Chief Complaint: Asthma (Asthma is fine until she gets to work and feels like she's suffocating.)  History of Present Illness: I had the pleasure of seeing [facility] for a follow up visit at [facility] on 05/30/2021. She is a 40 y.o. female, who is being followed for chronic urticaria on Xolair, asthma and chronic rhinitis. Her previous allergy office visit was on 08/23/2020 with Dr. Maudie Mercury. Today is a regular follow up visit.  Urticaria Patient was on Xolair injections and last injection was in May 2022 and no outbreaks since then. Noticed some itching last night but no more hives since her first injection with Xolair.   Currently on Xyzal 67m in the morning and sometimes takes another before going to work. No longer on Pepcid and ran out of Singulair a few months ago.   Asthma  [place]humid inside which makes it difficult for her to breathe.  Patient ran out Symbicort a few months ago and has been using albuterol 2 puffs at work when needed with good benefit.  Denies any ER/urgent care visits or prednisone use since the last visit.   Chronic Rhinitis Using Flonase 2 sprays per nostril once a day with good benefit.  07/31/2020 CXR: IMPRESSION: No active cardiopulmonary disease.  Assessment and Plan: HNellyis a 40y.o. female with: Chronic idiopathic urticaria Past history - Lab evaluation unremarkable (FCeRI antibody, anti-thyroglobulin antibody, thyroid peroxidase antibody, tryptase, H. pylori serology, CBC, CMP, ESR, ANA, and alpha-gal panel) Interim history - immediate relief with Xolair and has been off Xolair the last 3 months with no flares. Only takes Xyzal now.  Stop Xolair as urticaria resolved for now.   If breaking out again let u[place]oreaknow and start the following medications:  Zyrtec (cetirizine) 130mtwice a day. Pepcid (famotidine) 2014mwice a day.  Avoid the following potential triggers: alcohol, tight clothing, NSAIDs, hot showers and getting overheated.  Mild persistent asthma without complication Past history - normal CXR in 2021. Interim history - ran out of Symbicort and breathing only flares when [place] in [place]. Today's spirometry was normal. Daily controller medication(s): Symbicort 64m51m puffs once a day with spacer and rinse mouth afterwards before going to work. During upper respiratory infections/asthma flares: start Symbicort 64mc62mpuffs twice a day for 1-2 weeks until your breathing symptoms return to baseline.   May use albuterol rescue inhaler 2-4 puffs every 4 to 6 hours as needed for shortness of breath, chest tightness, coughing, and wheezing. May use albuterol rescue inhaler 2 puffs 5 to 15 minutes prior to strenuous physical activities. Monitor frequency of use.   Chronic rhinitis Past history - 2020 skin testing negative to pediatric environmental panel.  Interim history - well controlled with Flonase.  Use Flonase (fluticasone) nasal spray 1 spray per nostril twice a day as needed for nasal congestion.  Nasal saline spray (i.e., Simply Saline) or nasal saline lavage (i.e., NeilMed) is recommended as needed and prior to medicated nasal sprays.  Return in about 4 months (around 09/30/2021). Recommend establishing care with a PCP.  Meds ordered this encounter  Medications   budesonide-formoterol (SYMBICORT) 80-4.5 MCG/ACT inhaler    Sig: Inhale 2 puffs into the  lungs 2 (two) times daily as needed. with spacer and rinse mouth afterwards.    Dispense:  1 each    Refill:  5   albuterol (VENTOLIN HFA) 108 (90 Base) MCG/ACT inhaler    Sig: Inhale 2 puffs into the lungs every 4 (four) hours as needed for wheezing or shortness of breath.     Dispense:  18 g    Refill:  2   fluticasone (FLONASE) 50 MCG/ACT nasal spray    Sig: Place 1-2 sprays into both nostrils daily as needed for rhinitis.    Dispense:  16 g    Refill:  5    Lab Orders  No laboratory test(s) ordered today   Diagnostics: Spirometry:  Tracings reviewed. Her effort: Good reproducible efforts. FVC: 3.49L FEV1: 3.03L, 116% predicted FEV1/FVC ratio: 87% Interpretation: Spirometry consistent with normal pattern.  Please see scanned spirometry results for details.  Medication List:  Current Outpatient Medications  Medication Sig Dispense Refill   acetaminophen (TYLENOL) 325 MG tablet Take 2 tablets (650 mg total) by mouth every 6 (six) hours.     albuterol (VENTOLIN HFA) 108 (90 Base) MCG/ACT inhaler Inhale 2 puffs into the lungs every 4 (four) hours as needed for wheezing or shortness of breath. 18 g 2   budesonide-formoterol (SYMBICORT) 80-4.5 MCG/ACT inhaler Inhale 2 puffs into the lungs 2 (two) times daily as needed. with spacer and rinse mouth afterwards. 1 each 5   diphenhydrAMINE (BENADRYL) 25 MG tablet Take 1 tablet (25 mg total) by mouth every 6 (six) hours as needed for up to 30 doses for itching or allergies. 30 tablet 0   EPINEPHrine 0.3 mg/0.3 mL IJ SOAJ injection Inject 0.3 mLs (0.3 mg total) into the muscle as needed for up to 2 doses for anaphylaxis. 1 each 2   fluticasone (FLONASE) 50 MCG/ACT nasal spray Place 1-2 sprays into both nostrils daily as needed for rhinitis. 16 g 5   levocetirizine (XYZAL) 5 MG tablet Take 1 tablet (5 mg total) by mouth daily as needed for allergies. 30 tablet 5   No current facility-administered medications for this visit.   Allergies: Allergies  Allergen Reactions   Penicillins Shortness Of Breath and Other (See Comments)    Shut patients throat.   Sulfa Antibiotics Shortness Of Breath and Other (See Comments)    Shuts patient's throat.   Avocado Hives   I reviewed her past medical history, social  history, family history, and environmental history and no significant changes have been reported from her previous visit.  Review of Systems  Constitutional:  Negative for appetite change, chills, fever and unexpected weight change.  HENT:  Negative for congestion and rhinorrhea.   Eyes:  Negative for itching.  Respiratory:  Positive for shortness of breath. Negative for cough, chest tightness and wheezing.   Gastrointestinal:  Negative for abdominal pain.  Skin:  Negative for rash.  Allergic/Immunologic: Negative for environmental allergies and food allergies.  Neurological:  Negative for headaches.   Objective: BP 114/68 (BP Location: Right Arm, Patient Position: Sitting, Cuff Size: Normal)   Pulse 74   Temp (!) 97.4 F (36.3 C) (Temporal)   Ht 4' 11.1" (1.501 m)   Wt 148 lb 4 oz (67.2 kg)   SpO2 99%   BMI 29.84 kg/m  Body mass index is 29.84 kg/m. Physical Exam Vitals and nursing note reviewed.  Constitutional:      Appearance: Normal appearance. She is well-developed.  HENT:     Head: Normocephalic and atraumatic.  Right Ear: Tympanic membrane and external ear normal.     Left Ear: Tympanic membrane and external ear normal.     Nose: Nose normal.     Mouth/Throat:     Mouth: Mucous membranes are moist.     Pharynx: Oropharynx is clear.  Eyes:     Conjunctiva/sclera: Conjunctivae normal.  Cardiovascular:     Rate and Rhythm: Normal rate and regular rhythm.     Heart sounds: Normal heart sounds. No murmur heard.   No friction rub. No gallop.  Pulmonary:     Effort: Pulmonary effort is normal.     Breath sounds: Normal breath sounds. No wheezing or rales.  Musculoskeletal:     Cervical back: Neck supple.  Skin:    General: Skin is warm.     Findings: No rash.  Neurological:     Mental Status: She is alert and oriented to person, place, and time.  Psychiatric:        Behavior: Behavior normal.  Previous notes and tests were reviewed. The plan was reviewed  with the patient/family, and all questions/concerned were addressed.  It was my pleasure to see [facility] today and participate in her care. Please feel free to contact me with any questions or concerns.  Sincerely,  Rexene Alberts, DO Allergy & Immunology  [facility] office: [place] office: [phone]

## 2021-05-30 ENCOUNTER — Encounter: Payer: Self-pay | Admitting: Allergy

## 2021-05-30 ENCOUNTER — Ambulatory Visit (INDEPENDENT_AMBULATORY_CARE_PROVIDER_SITE_OTHER): Payer: BC Managed Care – PPO | Admitting: Allergy

## 2021-05-30 ENCOUNTER — Other Ambulatory Visit: Payer: Self-pay

## 2021-05-30 VITALS — BP 114/68 | HR 74 | Temp 97.4°F | Ht 59.1 in | Wt 148.2 lb

## 2021-05-30 DIAGNOSIS — J4530 Mild persistent asthma, uncomplicated: Secondary | ICD-10-CM | POA: Diagnosis not present

## 2021-05-30 DIAGNOSIS — J310 Chronic rhinitis: Secondary | ICD-10-CM | POA: Diagnosis not present

## 2021-05-30 DIAGNOSIS — L501 Idiopathic urticaria: Secondary | ICD-10-CM | POA: Insufficient documentation

## 2021-05-30 MED ORDER — ALBUTEROL SULFATE HFA 108 (90 BASE) MCG/ACT IN AERS: 2.0000 | INHALATION_SPRAY | RESPIRATORY_TRACT | 2 refills | Status: DC | PRN

## 2021-05-30 MED ORDER — FLUTICASONE PROPIONATE 50 MCG/ACT NA SUSP: 1.0000 | Freq: Every day | NASAL | 5 refills | Status: DC | PRN

## 2021-05-30 MED ORDER — BUDESONIDE-FORMOTEROL FUMARATE 80-4.5 MCG/ACT IN AERO: 2.0000 | INHALATION_SPRAY | Freq: Two times a day (BID) | RESPIRATORY_TRACT | 5 refills | Status: DC | PRN

## 2021-05-30 NOTE — Assessment & Plan Note (Signed)
Past history - normal CXR in 2021. Interim history - ran out of Symbicort and breathing only flares when at work in [employer] where it gets very hot.  Today's spirometry was normal. . Daily controller medication(s): Symbicort 2 puffs once a day with spacer and rinse mouth afterwards before going to work. . During upper respiratory infections/asthma flares: start Symbicort 2 puffs twice a day for 1-2 weeks until your breathing symptoms return to baseline.   . May use albuterol rescue inhaler 2-4 puffs every 4 to 6 hours as needed for shortness of breath, chest tightness, coughing, and wheezing. May use albuterol rescue inhaler 2 puffs 5 to 15 minutes prior to strenuous physical activities. Monitor frequency of use.

## 2021-05-30 NOTE — Patient Instructions (Addendum)
Urticaria Stop Xolair as your hives went away. If you start breaking out again let us know and start the following medications:  Zyrtec (cetirizine) 10mg  twice a day. Pepcid (famotidine) 20mg  twice a day.  Avoid the following potential triggers: alcohol, tight clothing, NSAIDs, hot showers and getting overheated.  Chronic rhinitis Use Flonase (fluticasone) nasal spray 1 spray per nostril twice a day as needed for nasal congestion.  Nasal saline spray (i.e., Simply Saline) or nasal saline lavage (i.e., NeilMed) is recommended as needed and prior to medicated nasal sprays.   Asthma  Daily controller medication(s): Symbicort 2 puffs once a day with spacer and rinse mouth afterwards before going to work. During upper respiratory infections/asthma flares: start Symbicort 2 puffs twice a day for 1-2 weeks until your breathing symptoms return to baseline.   May use albuterol rescue inhaler 2-4 puffs every 4 to 6 hours as needed for shortness of breath, chest tightness, coughing, and wheezing. May use albuterol rescue inhaler 2 puffs 5 to 15 minutes prior to strenuous physical activities. Monitor frequency of use.  Asthma control goals:  Full participation in all desired activities (may need albuterol before activity) Albuterol use two times or less a week on average (not counting use with activity) Cough interfering with sleep two times or less a month Oral steroids no more than once a year No hospitalizations  Follow up in 4 months or sooner if needed.  Recommend establishing care with a PCP.

## 2021-05-30 NOTE — Assessment & Plan Note (Signed)
Past history - 2020 skin testing negative to pediatric environmental panel.  Interim history - well controlled with Flonase.   Use Flonase (fluticasone) nasal spray 1 spray per nostril twice a day as needed for nasal congestion.   Nasal saline spray (i.e., Simply Saline) or nasal saline lavage (i.e., NeilMed) is recommended as needed and prior to medicated nasal sprays.

## 2021-05-30 NOTE — Assessment & Plan Note (Signed)
Past history - Lab evaluation unremarkable (FCeRI antibody, anti-thyroglobulin antibody, thyroid peroxidase antibody, tryptase, H. pylori serology, CBC, CMP, ESR, ANA, and alpha-gal panel) Interim history - immediate relief with Xolair and has been off Xolair the last 3 months with no flares. Only takes Xyzal now.   Stop Xolair as urticaria resolved for now.   If breaking out again let us know and start the following medications:   Zyrtec (cetirizine) 52m twice a day.  Pepcid (famotidine) 281mtwice a day.  . Avoid the following potential triggers: alcohol, tight clothing, NSAIDs, hot showers and getting overheated.

## 2021-06-02 ENCOUNTER — Encounter (HOSPITAL_COMMUNITY): Payer: Self-pay | Admitting: Emergency Medicine

## 2021-06-02 ENCOUNTER — Emergency Department (HOSPITAL_COMMUNITY)
Admission: EM | Admit: 2021-06-02 | Discharge: 2021-06-02 | Disposition: A | Discharge status: Home / Self Care | Payer: BC Managed Care – PPO | Attending: Emergency Medicine | Admitting: Emergency Medicine

## 2021-06-02 ENCOUNTER — Emergency Department (HOSPITAL_COMMUNITY): Payer: BC Managed Care – PPO

## 2021-06-02 DIAGNOSIS — Z7951 Long term (current) use of inhaled steroids: Secondary | ICD-10-CM | POA: Diagnosis not present

## 2021-06-02 DIAGNOSIS — W1830XA Fall on same level, unspecified, initial encounter: Secondary | ICD-10-CM | POA: Insufficient documentation

## 2021-06-02 DIAGNOSIS — J4530 Mild persistent asthma, uncomplicated: Secondary | ICD-10-CM | POA: Diagnosis not present

## 2021-06-02 DIAGNOSIS — M79645 Pain in left finger(s): Secondary | ICD-10-CM | POA: Insufficient documentation

## 2021-06-02 DIAGNOSIS — W19XXXA Unspecified fall, initial encounter: Secondary | ICD-10-CM

## 2021-06-02 DIAGNOSIS — M79642 Pain in left hand: Secondary | ICD-10-CM | POA: Diagnosis not present

## 2021-06-02 NOTE — ED Provider Notes (Signed)
Emergency Medicine Provider Triage Evaluation Note  Alison Vega , Vega 40 y.o. female  was evaluated in triage.  Pt complains of left hand pain. Mechanical fall yesterday. Landed hand. Pain to left thenar eminence. No pain to wrist, forearm. No numbness, swelling, redness  Review of Systems  Positive: Left hand pain Negative: Numbness, weakness, swelling  Physical Exam  There were no vitals taken for this visit. Gen:   Awake, no distress   Resp:  Normal effort  MSK:   Moves extremities without difficulty. Tenderness to left thenar eminence  Other:    Medical Decision Making  Medically screening exam initiated at 10:40 AM.  Appropriate orders placed.  Alison Vega was informed that the remainder of the evaluation will be completed by another provider, this initial triage assessment does not replace that evaluation, and the importance of remaining in the ED until their evaluation is complete.  Hand pain     Alison Terra A, PA-C 06/02/21 1042    Margarita Grizzle, MD 06/03/21 1339

## 2021-06-02 NOTE — ED Provider Notes (Addendum)
[facility] EMERGENCY DEPARTMENT Provider Note   CSN: [id number] Arrival date & time: 06/02/21  1026     History Left hand pain   Alison Vega is a 40 y.o. female with no significant past medical history who presents for evaluation of left hand pain. Fell onto left hand yesterday. Pain to left thenar eminence. No pain to wrist. No redness, swelling, warmth. No numbness,  weakness. Did not hit head when fell. Ambulatory since. Denies additional aggravating or alleviating factors.  History obtained from patient and past medical records. No interpretor was used.  Previously followed by Dr. Janee Morn with hand surgery in 2020. Has not seen since.  HPI     Past Medical History:  Diagnosis Date   Asthma    Cellulitis    cellulitis/abcess x15, "every 2 months"    Patient Active Problem List   Diagnosis Date Noted   Chronic idiopathic urticaria 05/30/2021   Shortness of breath 07/31/2020   Mild persistent asthma without complication 09/13/2019   Chronic rhinitis 09/13/2019    Past Surgical History:  Procedure Laterality Date   CESAREAN SECTION     EYE SURGERY     OPEN REDUCTION INTERNAL FIXATION (ORIF) METACARPAL Right 06/13/2019   Procedure: OPEN TREATMENT OF RIGHT 5TH METACARPAL FRACTURE;  Surgeon: Mack Hook, MD;  Location: [facility];  Service: Orthopedics;  Laterality: Right;   TUBAL LIGATION       OB History   No obstetric history on file.     No family history on file.  Social History   Tobacco Use   Smoking status: Never   Smokeless tobacco: Never  Substance Use Topics   Alcohol use: No   Drug use: No    Home Medications Prior to Admission medications   Medication Sig Start Date End Date Taking? Authorizing Provider  acetaminophen (TYLENOL) 325 MG tablet Take 2 tablets (650 mg total) by mouth every 6 (six) hours. 06/13/19   Mack Hook, MD  albuterol (VENTOLIN HFA) 108 (90 Base) MCG/ACT inhaler Inhale 2 puffs  into the lungs every 4 (four) hours as needed for wheezing or shortness of breath. 05/30/21   Ellamae Sia, DO  budesonide-formoterol (SYMBICORT) 80-4.5 MCG/ACT inhaler Inhale 2 puffs into the lungs 2 (two) times daily as needed. with spacer and rinse mouth afterwards. 05/30/21   Ellamae Sia, DO  diphenhydrAMINE (BENADRYL) 25 MG tablet Take 1 tablet (25 mg total) by mouth every 6 (six) hours as needed for up to 30 doses for itching or allergies. 09/04/19   Terald Sleeper, MD  EPINEPHrine 0.3 mg/0.3 mL IJ SOAJ injection Inject 0.3 mLs (0.3 mg total) into the muscle as needed for up to 2 doses for anaphylaxis. 09/04/19   Terald Sleeper, MD  fluticasone (FLONASE) 50 MCG/ACT nasal spray Place 1-2 sprays into both nostrils daily as needed for rhinitis. 05/30/21   Ellamae Sia, DO  levocetirizine (XYZAL) 5 MG tablet Take 1 tablet (5 mg total) by mouth daily as needed for allergies. 09/13/19   Bobbitt, Heywood Iles, MD    Allergies    Penicillins, Sulfa antibiotics, and Avocado  Review of Systems   Review of Systems  Constitutional: Negative.   HENT: Negative.    Respiratory: Negative.    Cardiovascular: Negative.   Gastrointestinal: Negative.   Genitourinary: Negative.   Musculoskeletal: Negative.        Left thenar eminence pain  Skin: Negative.   Neurological: Negative.   All other systems reviewed  and are negative.  Physical Exam Updated Vital Signs BP 135/85 (BP Location: Right Arm)   Pulse 75   Temp 98.9 F (37.2 C) (Oral)   Resp 17   LMP 05/26/2021   SpO2 100%   Physical Exam Vitals and nursing note reviewed.  Constitutional:      General: She is not in acute distress.    Appearance: She is well-developed. She is not ill-appearing, toxic-appearing or diaphoretic.  HENT:     Head: Normocephalic and atraumatic.     Nose: Nose normal.  Eyes:     Pupils: Pupils are equal, round, and reactive to light.  Cardiovascular:     Rate and Rhythm: Normal rate.     Pulses: Normal  pulses.     Heart sounds: Normal heart sounds.  Pulmonary:     Effort: Pulmonary effort is normal. No respiratory distress.     Breath sounds: Normal breath sounds.  Abdominal:     General: Bowel sounds are normal. There is no distension.     Palpations: Abdomen is soft.  Musculoskeletal:        General: Normal range of motion.     Right elbow: Normal.     Left elbow: Normal.     Right forearm: Normal.     Left forearm: Normal.     Right wrist: Normal.     Left wrist: Normal.     Right hand: Normal.     Left hand: Tenderness present. No swelling, deformity or lacerations. Normal range of motion. Normal strength.       Hands:     Cervical back: Normal range of motion.  Skin:    General: Skin is warm and dry.     Capillary Refill: Capillary refill takes less than 2 seconds.     Comments: No redness, swelling, warmth, fluctuance or induration.  Neurological:     General: No focal deficit present.     Mental Status: She is alert and oriented to person, place, and time.     Sensory: Sensation is intact.     Motor: Motor function is intact.     Coordination: Coordination is intact.     Comments: Intact sensation Equal grip  Psychiatric:        Mood and Affect: Mood normal.    ED Results / Procedures / Treatments   Labs (all labs ordered are listed, but only abnormal results are displayed) Labs Reviewed - No data to display  EKG None  Radiology DG Hand Complete Left  Result Date: 06/02/2021 CLINICAL DATA:  Fall, left hand pain EXAM: LEFT HAND - COMPLETE 3+ VIEW COMPARISON:  None. FINDINGS: There is no evidence of fracture or dislocation. There is no evidence of arthropathy or other focal bone abnormality. Soft tissues are unremarkable. IMPRESSION: Negative. Electronically Signed   By: Emmaline Kluver M.D.   On: 06/02/2021 11:10    Procedures .Splint Application  Date/Time: 06/02/2021 11:30 AM Performed by: Linwood Dibbles, PA-C Authorized by: Linwood Dibbles,  PA-C   Consent:    Consent obtained:  Verbal   Consent given by:  Patient   Risks discussed:  Discoloration, numbness, pain and swelling   Alternatives discussed:  No treatment, delayed treatment, alternative treatment, observation and referral Universal protocol:    Procedure explained and questions answered to patient or proxy's satisfaction: yes     Relevant documents present and verified: yes     Test results available: yes     Imaging studies available: yes  Required blood products, implants, devices, and special equipment available: yes     Site/side marked: yes     Immediately prior to procedure a time out was called: yes     Patient identity confirmed:  Verbally with patient Pre-procedure details:    Distal neurologic exam:  Normal   Distal perfusion: distal pulses strong and brisk capillary refill   Procedure details:    Location:  Hand   Hand location:  L hand   Strapping: no     Cast type:  Short arm   Attestation: Splint applied and adjusted personally by me   Post-procedure details:    Distal neurologic exam:  Normal   Distal perfusion: distal pulses strong and brisk capillary refill     Procedure completion:  Tolerated well, no immediate complications   Post-procedure imaging: not applicable     Medications Ordered in ED Medications - No data to display  ED Course  I have reviewed the triage vital signs and the nursing notes.  Pertinent labs & imaging results that were available during my care of the patient were reviewed by me and considered in my medical decision making (see chart for details).  Mechanical fall yesterday. Denies hitting head, LOC, anticoag. Pain to left thenar eminence. Full ROM. No wrist, scaphoid pain, forearm pain. NV intact.  Xray without acute findings  Placed in thumb spica for comfort. RICE for sx management, fu with ortho  Low suspicion for occult fracture, infectious process, VTE, ischemic, septic joint.  The patient has been  appropriately medically screened and/or stabilized in the ED. I have low suspicion for any other emergent medical condition which would require further screening, evaluation or treatment in the ED or require inpatient management.  Patient is hemodynamically stable and in no acute distress.  Patient able to ambulate in department prior to ED.  Evaluation does not show acute pathology that would require ongoing or additional emergent interventions while in the emergency department or further inpatient treatment.  I have discussed the diagnosis with the patient and answered all questions.  Pain is been managed while in the emergency department and patient has no further complaints prior to discharge.  Patient is comfortable with plan discussed in room and is stable for discharge at this time.  I have discussed strict return precautions for returning to the emergency department.  Patient was encouraged to follow-up with PCP/specialist refer to at discharge.     MDM Rules/Calculators/A&P                            Final Clinical Impression(s) / ED Diagnoses Final diagnoses:  Fall, initial encounter  Pain of left thumb    Rx / DC Orders ED Discharge Orders     None            Vearl Allbaugh A, PA-C 06/02/21 1220    Terald Sleeper, MD 06/03/21 [phone]

## 2021-06-02 NOTE — ED Triage Notes (Signed)
Pt had a mechanical fall yesterday and c/o L hand pain.  CMS intact.

## 2021-06-02 NOTE — Progress Notes (Signed)
Orthopedic Tech Progress Note Patient Details:  Alison Vega 11-09-1980 [id number]   Ortho Devices Type of Ortho Device: Thumb velcro splint Ortho Device/Splint Location: LUE Ortho Device/Splint Interventions: Ordered, Application   Post Interventions Patient Tolerated: Well Instructions Provided: Care of device, Adjustment of device  Scot Shiraishi [name] 06/02/2021, 11:59 AM

## 2021-06-02 NOTE — ED Notes (Signed)
Discharged by PA at triage. 

## 2021-06-02 NOTE — Discharge Instructions (Addendum)
Ibuprofen for pain.  Ice and rest  Follow up with an Orthopedist if pain does not improve

## 2021-10-01 ENCOUNTER — Other Ambulatory Visit: Payer: Self-pay | Admitting: Allergy

## 2021-10-01 ENCOUNTER — Encounter: Payer: Self-pay | Admitting: Allergy

## 2021-10-01 ENCOUNTER — Other Ambulatory Visit: Payer: Self-pay

## 2021-10-01 ENCOUNTER — Ambulatory Visit (INDEPENDENT_AMBULATORY_CARE_PROVIDER_SITE_OTHER): Payer: BC Managed Care – PPO | Admitting: Allergy

## 2021-10-01 VITALS — BP 116/84 | HR 70 | Temp 98.0°F | Resp 16 | Ht 62.0 in | Wt 154.0 lb

## 2021-10-01 DIAGNOSIS — J310 Chronic rhinitis: Secondary | ICD-10-CM

## 2021-10-01 DIAGNOSIS — J4530 Mild persistent asthma, uncomplicated: Secondary | ICD-10-CM | POA: Diagnosis not present

## 2021-10-01 DIAGNOSIS — J4540 Moderate persistent asthma, uncomplicated: Secondary | ICD-10-CM

## 2021-10-01 DIAGNOSIS — L501 Idiopathic urticaria: Secondary | ICD-10-CM | POA: Diagnosis not present

## 2021-10-01 MED ORDER — LEVOCETIRIZINE DIHYDROCHLORIDE 5 MG PO TABS: 5.0000 mg | ORAL_TABLET | Freq: Two times a day (BID) | ORAL | 2 refills | Status: DC | PRN

## 2021-10-01 MED ORDER — PROAIR DIGIHALER 108 (90 BASE) MCG/ACT IN AEPB: 2.0000 | INHALATION_SPRAY | RESPIRATORY_TRACT | 2 refills | Status: DC | PRN

## 2021-10-01 MED ORDER — AIRDUO DIGIHALER 113-14 MCG/ACT IN AEPB: 1.0000 | INHALATION_SPRAY | Freq: Two times a day (BID) | RESPIRATORY_TRACT | 5 refills | Status: DC

## 2021-10-01 NOTE — Progress Notes (Signed)
 Follow Up Note  RE: Alison Vega MRN: [id number] DOB: 12/21/1980 Date of Office Visit: 10/01/2021  Referring provider: No ref. provider found Primary care provider: Kuneff, Renee A, DO  Chief Complaint: Urticaria (No flares ) and Asthma (Can't breath when it is extremely cold or extremely hot. 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 Does not sleep for more than an hour her pulse drops when she lays on her left side 90-95%)  History of Present Illness: I had the pleasure of seeing Alison Vega for a follow up visit at the [facility] on 10/01/2021. She is a 40 y.o. female, who is being followed for chronic idiopathic urticaria, asthma and chronic rhinitis. Her previous allergy office visit was on 05/30/2021 with Dr. . Today is a regular follow up visit.  Chronic idiopathic urticaria Still taking Xyzal BID due to her sinus issues. No more hive outbreaks or itching since the last visit.   Mild persistent asthma Patient states she has some shortness of breath when it's extremely cold or hot. She noticed when she is laying on her left side her pulse ox drops down to lower 90s. It does cause some chest tightness.  Patient has not used Symbicort for the past 3 months - it was too expensive $144.  Using albuterol more since the weather got cold with good benefit.  Denies any ER/urgent care visits or prednisone use since the last visit.  Chronic rhinitis Taking zyrtec 10mg BID and Flonase 1 spray per nostril daily which helps her sinuses.   Assessment and Plan: Alison Vega is a 40 y.o. female with: Chronic idiopathic urticaria Past history - Lab evaluation unremarkable (FCeRI antibody, anti-thyroglobulin antibody, thyroid peroxidase antibody, tryptase, H. pylori serology, CBC, CMP, ESR, ANA, and alpha-gal panel). On Xolair in the past.  Interim history - no outbreaks or flares. Takes Xyzal 5mg BID for her sinus issues. If you  start breaking out again let us know and start the following medications:  Xyzal 5mg twice a day. Pepcid (famotidine) 20mg twice a day.  Avoid the following potential triggers: alcohol, tight clothing, NSAIDs, hot showers and getting overheated.  Moderate persistent asthma without complication Past history - normal CXR in 2021. Interim history - no Symbicort for the past few months due to cost. Noticing more issues with her breathing since weather is cold and using albuterol more often. No prednisone.  Today's spirometry was normal - worse then previous one.  Daily controller medication(s): Start Airduo 113 mcg 1 puff twice a day and rinse mouth after each use. Sample given. Demonstrated proper use. If you have issues with not being able to get your inhalers then let our office know. May use albuterol rescue inhaler 2-4 puffs every 4 to 6 hours as needed for shortness of breath, chest tightness, coughing, and wheezing. May use albuterol rescue inhaler 2 puffs 5 to 15 minutes prior to strenuous physical activities. Monitor frequency of use. Proair digihaler sample given. Get spirometry at next visit.  Nonallergic rhinitis Past history - 2020 skin testing negative to pediatric environmental panel.  Interim history - controlled with Flonase and Xyzal. Use Flonase (fluticasone) nasal spray 1 spray per nostril twice a day as needed for nasal congestion OR Use Nasacort (triamcinolone) nasal spray 1 spray per nostril twice a day as needed for nasal congestion. Samples given.  Nasal saline spray (i.e., Simply Saline) or nasal saline lavage (i.e., NeilMed) is recommended as needed and   prior to medicated nasal sprays. May take Xyzal (levocetirizine) 42m 1-2 times daily as needed.   Return in about 4 months (around 01/29/2022).  Meds ordered this encounter  Medications   levocetirizine (XYZAL) 5 MG tablet    Sig: Take 1 tablet (5 mg total) by mouth 2 (two) times daily as needed for allergies (hives).     Dispense:  180 tablet    Refill:  2   Fluticasone-Salmeterol,sensor, (AIRDUO DIGIHALER) 113-14 MCG/ACT AEPB    Sig: Inhale 1 puff into the lungs 2 (two) times daily. rinse mouth after each use    Dispense:  1 each    Refill:  5    Please apply saving card ID : Digihaler [employer] : [id number] BIN :: [id number] : [id number]   Albuterol Sulfate, sensor, (PROAIR DIGIHALER) 108 (90 Base) MCG/ACT AEPB    Sig: Inhale 2 puffs into the lungs as needed (coughing, wheezing, shortness of breath).    Dispense:  1 each    Refill:  2    Please apply saving card ID : Digihaler GROUP : [id number] BIN :: [id number] : [id number]   Lab Orders  No laboratory test(s) ordered today    Diagnostics: Spirometry:  Tracings reviewed. Her effort: Good reproducible efforts. FVC: 3.07L FEV1: 2.68L, 94% predicted FEV1/FVC ratio: 87% Interpretation: Spirometry consistent with normal pattern.  Please see scanned spirometry results for details.  Medication List:  Current Outpatient Medications  Medication Sig Dispense Refill   acetaminophen (TYLENOL) 325 MG tablet Take 2 tablets (650 mg total) by mouth every 6 (six) hours.     Albuterol Sulfate, sensor, (PROAIR DIGIHALER) 108 (90 Base) MCG/ACT AEPB Inhale 2 puffs into the lungs as needed (coughing, wheezing, shortness of breath). 1 each 2   diphenhydrAMINE (BENADRYL) 25 MG tablet Take 1 tablet (25 mg total) by mouth every 6 (six) hours as needed for up to 30 doses for itching or allergies. 30 tablet 0   fluticasone (FLONASE) 50 MCG/ACT nasal spray Place 1-2 sprays into both nostrils daily as needed for rhinitis. 16 g 5   Fluticasone-Salmeterol,sensor, (AIRDUO DIGIHALER) 113-14 MCG/ACT AEPB Inhale 1 puff into the lungs 2 (two) times daily. rinse mouth after each use 1 each 5   levocetirizine (XYZAL) 5 MG tablet Take 1 tablet (5 mg total) by mouth 2 (two) times daily as needed for allergies (hives). 180 tablet 2   No current facility-administered medications for this visit.    Allergies: Allergies  Allergen Reactions   Penicillins Shortness Of Breath and Other (See Comments)    Shut patients throat.   Sulfa Antibiotics Shortness Of Breath and Other (See Comments)    Shuts patient's throat.   Avocado Hives   I reviewed her past medical history, social history, family history, and environmental history and no significant changes have been reported from her previous visit.  Review of Systems  Constitutional:  Negative for appetite change, chills, fever and unexpected weight change.  HENT:  Negative for congestion and rhinorrhea.   Eyes:  Negative for itching.  Respiratory:  Positive for shortness of breath. Negative for cough, chest tightness and wheezing.   Gastrointestinal:  Negative for abdominal pain.  Skin:  Negative for rash.  Allergic/Immunologic: Negative for environmental allergies and food allergies.  Neurological:  Negative for headaches.   Objective: BP 116/84   Pulse 70   Temp 98 F (36.7 C)   Resp 16   Ht 5' 2" (1.575 m)   Wt 154 lb (69.9 kg)  SpO2 99%   BMI 28.17 kg/m  Body mass index is 28.17 kg/m. Physical Exam Vitals and nursing note reviewed.  Constitutional:      Appearance: Normal appearance. She is well-developed.  HENT:     Head: Normocephalic and atraumatic.     Right Ear: Tympanic membrane and external ear normal.     Left Ear: Tympanic membrane and external ear normal.     Nose: Nose normal.     Mouth/Throat:     Mouth: Mucous membranes are moist.     Pharynx: Oropharynx is clear.  Eyes:     Conjunctiva/sclera: Conjunctivae normal.  Cardiovascular:     Rate and Rhythm: Normal rate and regular rhythm.     Heart sounds: Normal heart sounds. No murmur heard.   No friction rub. No gallop.  Pulmonary:     Effort: Pulmonary effort is normal.     Breath sounds: Normal breath sounds. No wheezing or rales.  Musculoskeletal:     Cervical back: Neck supple.  Skin:    General: Skin is warm.     Findings: No rash.   Neurological:     Mental Status: She is alert and oriented to person, place, and time.  Psychiatric:        Behavior: Behavior normal.   Previous notes and tests were reviewed. The plan was reviewed with the patient/family, and all questions/concerned were addressed.  It was my pleasure to see Alison Vega today and participate in her care. Please feel free to contact me with any questions or concerns.  Sincerely,   , DO Allergy & Immunology  [facility] [place] office: [phone] [place] office: [phone]  

## 2021-10-01 NOTE — Assessment & Plan Note (Signed)
Past history - Lab evaluation unremarkable (FCeRI antibody, anti-thyroglobulin antibody, thyroid peroxidase antibody, tryptase, H. pylori serology, CBC, CMP, ESR, ANA, and alpha-gal panel). On Xolair in the past.  Interim history - no outbreaks or flares. Takes Xyzal 32m BID for her sinus issues.  If you start breaking out again let u[place]ore[place]now and start the following medications:   Xyzal 559mtwice a day.  Pepcid (famotidine) 2073mwice a day.  . Avoid the following potential triggers: alcohol, tight clothing, NSAIDs, hot showers and getting overheated.

## 2021-10-01 NOTE — Assessment & Plan Note (Signed)
Past history - normal CXR in 2021. Interim history - no Symbicort for the past few months due to cost. Noticing more issues with her breathing since weather is cold and using albuterol more often. No prednisone.   Today's spirometry was normal - worse then previous one.  . Daily controller medication(s): Start Airduo 113 mcg 1 puff twice a day and rinse mouth after each use. Sample given. Demonstrated proper use.  If you have issues with not being able to get your inhalers then let our office know. . May use albuterol rescue inhaler 2-4 puffs every 4 to 6 hours as needed for shortness of breath, chest tightness, coughing, and wheezing. May use albuterol rescue inhaler 2 puffs 5 to 15 minutes prior to strenuous physical activities. Monitor frequency of use. Proair digihaler sample given. . Get spirometry at next visit.

## 2021-10-01 NOTE — Assessment & Plan Note (Signed)
Past history - 2020 skin testing negative to pediatric environmental panel.  Interim history - controlled with Flonase and Xyzal.  Use Flonase (fluticasone) nasal spray 1 spray per nostril twice a day as needed for nasal congestion OR  Use Nasacort (triamcinolone) nasal spray 1 spray per nostril twice a day as needed for nasal congestion. Samples given.   Nasal saline spray (i.e., Simply Saline) or nasal saline lavage (i.e., NeilMed) is recommended as needed and prior to medicated nasal sprays.  May take Xyzal (levocetirizine) 5mg  1-2 times daily as needed.

## 2021-10-01 NOTE — Patient Instructions (Addendum)
Urticaria If you start breaking out again let us know and start the following medications:  Xyzal 5mg  twice a day. Pepcid (famotidine) 20mg  twice a day.  Avoid the following potential triggers: alcohol, tight clothing, NSAIDs, hot showers and getting overheated.  Chronic rhinitis Use Flonase (fluticasone) nasal spray 1 spray per nostril twice a day as needed for nasal congestion OR Use Nasacort (triamcinolone) nasal spray 1 spray per nostril twice a day as needed for nasal congestion. Samples given.  Nasal saline spray (i.e., Simply Saline) or nasal saline lavage (i.e., NeilMed) is recommended as needed and prior to medicated nasal sprays. May take Xyzal (levocetirizine) 5mg  1-2 times daily as needed.    Asthma  Daily controller medication(s): Start Airduo 113 mcg 1 puff twice a day and rinse mouth after each use. Sample given. Demonstrated proper use. If you have issues with not being able to get your inhalers then let our office know. Hopefully this inhaler will be cheaper than Symbicort.  May use albuterol rescue inhaler 2-4 puffs every 4 to 6 hours as needed for shortness of breath, chest tightness, coughing, and wheezing. May use albuterol rescue inhaler 2 puffs 5 to 15 minutes prior to strenuous physical activities. Monitor frequency of use.  Asthma control goals:  Full participation in all desired activities (may need albuterol before activity) Albuterol use two times or less a week on average (not counting use with activity) Cough interfering with sleep two times or less a month Oral steroids no more than once a year No hospitalizations  Follow up in 4 months or sooner if needed.

## 2021-10-03 ENCOUNTER — Telehealth: Payer: Self-pay

## 2021-10-03 NOTE — Telephone Encounter (Signed)
Lm for pt to call us back she will have to purchase xyzal otc as it is not covered by her insruance

## 2021-10-30 ENCOUNTER — Encounter: Payer: Self-pay | Admitting: Family Medicine

## 2021-11-01 ENCOUNTER — Other Ambulatory Visit: Payer: Self-pay

## 2021-11-01 ENCOUNTER — Ambulatory Visit (INDEPENDENT_AMBULATORY_CARE_PROVIDER_SITE_OTHER): Payer: BC Managed Care – PPO | Admitting: Family Medicine

## 2021-11-01 ENCOUNTER — Encounter: Payer: Self-pay | Admitting: Family Medicine

## 2021-11-01 VITALS — BP 135/83 | HR 88 | Temp 98.5°F | Ht 62.0 in | Wt 158.0 lb

## 2021-11-01 DIAGNOSIS — J029 Acute pharyngitis, unspecified: Secondary | ICD-10-CM | POA: Diagnosis not present

## 2021-11-01 DIAGNOSIS — J4541 Moderate persistent asthma with (acute) exacerbation: Secondary | ICD-10-CM

## 2021-11-01 DIAGNOSIS — R051 Acute cough: Secondary | ICD-10-CM | POA: Diagnosis not present

## 2021-11-01 DIAGNOSIS — Z7689 Persons encountering health services in other specified circumstances: Secondary | ICD-10-CM

## 2021-11-01 DIAGNOSIS — F5101 Primary insomnia: Secondary | ICD-10-CM | POA: Insufficient documentation

## 2021-11-01 DIAGNOSIS — J45909 Unspecified asthma, uncomplicated: Secondary | ICD-10-CM | POA: Insufficient documentation

## 2021-11-01 LAB — POC COVID19 BINAXNOW: SARS Coronavirus 2 Ag: NEGATIVE

## 2021-11-01 LAB — POCT INFLUENZA A/B
Influenza A, POC: NEGATIVE
Influenza B, POC: NEGATIVE

## 2021-11-01 LAB — POCT RAPID STREP A (OFFICE): Rapid Strep A Screen: NEGATIVE

## 2021-11-01 MED ORDER — BENZONATATE 200 MG PO CAPS: 200.0000 mg | ORAL_CAPSULE | Freq: Two times a day (BID) | ORAL | 0 refills | Status: DC | PRN

## 2021-11-01 MED ORDER — HYDROXYZINE PAMOATE 50 MG PO CAPS: 50.0000 mg | ORAL_CAPSULE | Freq: Every evening | ORAL | 1 refills | Status: DC | PRN

## 2021-11-01 MED ORDER — PREDNISONE 20 MG PO TABS: 40.0000 mg | ORAL_TABLET | Freq: Every day | ORAL | 0 refills | Status: DC

## 2021-11-01 MED ORDER — DOXYCYCLINE HYCLATE 100 MG PO TABS: 100.0000 mg | ORAL_TABLET | Freq: Two times a day (BID) | ORAL | 0 refills | Status: DC

## 2021-11-01 NOTE — Patient Instructions (Addendum)
°  Great to see you today.  I have refilled the medication(s) we provide.   If labs were collected, we will inform you of lab results once received either by echart message or telephone call.   - echart message- for normal results that have been seen by the patient already.   - telephone call: abnormal results or if patient has not viewed results in their echart.  Next appt in about 6-8 weeks for physical and labs

## 2021-11-01 NOTE — Progress Notes (Signed)
Patient ID: Alison Vega, female  DOB: 04/23/1981, 40 y.o.   MRN: [id number] Patient Care Team    Relationship Specialty Notifications Start End  Natalia Leatherwood, DO PCP - General Family Medicine  10/01/21   Ellamae Sia, DO Consulting Physician Allergy  11/01/21     Chief Complaint  Patient presents with   Establish Care   Cough    Pt c/o cough, nasal congestion, wheezing, elevated HR; believes she was exposed to RSV    Subjective: Alison Vega is a 40 y.o.  female present for new patient establishment. All past medical history, surgical history, allergies, family history, immunizations, medications and social history were updated in the electronic medical record today. All recent labs, ED visits and hospitalizations within the last year were reviewed.  Cough:  She has a h/o chronic asthma and allergic rhinitis. She reports compliance with inhalers, flonase and xyzal- managed by [facility]. She endorses a new acute cough, sore throat, nasal congestion, wheezing and elevated HR over the last 3.5 days. She thinks she may have been exposed to RSV.  She is UTD with COVID and influenza vaccine.   Depression screen PHQ 2/9 11/01/2021  Decreased Interest 0  Down, Depressed, Hopeless 0  PHQ - 2 Score 0   No flowsheet data found.      No flowsheet data found.  Immunization History  Administered Date(s) Administered   Influenza-Unspecified 08/03/2021   Moderna Sars-Covid-2 Vaccination 02/15/2020, 03/09/2020, 09/26/2020   PNEUMOCOCCAL CONJUGATE-20 05/30/2021   Tdap 05/30/2021    No results found.  Past Medical History:  Diagnosis Date   Asthma    Cellulitis    cellulitis/abcess x15, "every 2 months"   Urticaria    Allergies  Allergen Reactions   Penicillins Shortness Of Breath and Other (See Comments)    Shut patients throat.   Sulfa Antibiotics Shortness Of Breath and Other (See Comments)    Shuts patient's throat.   Avocado Hives   Past Surgical History:  Procedure  Laterality Date   CESAREAN SECTION     EYE SURGERY     x3   OPEN REDUCTION INTERNAL FIXATION (ORIF) METACARPAL Right 06/13/2019   Procedure: OPEN TREATMENT OF RIGHT 5TH METACARPAL FRACTURE;  Surgeon: Mack Hook, MD;  Location: [facility];  Service: Orthopedics;  Laterality: Right;   TUBAL LIGATION     Family History  Problem Relation Age of Onset   Diabetes Mother    Asthma Mother    Hepatitis C Mother    Cancer Father        unknown type   Asthma Son    Social History   Social History Narrative   Marital status/children/pets: Single. G3P3   Education/employment: HS, employed at [employer].    Safety:      -smoke alarm in the home:Yes     - wears seatbelt: Yes     - Feels safe in their relationships: Yes       Allergies as of 11/01/2021       Reactions   Penicillins Shortness Of Breath, Other (See Comments)   Shut patients throat.   Sulfa Antibiotics Shortness Of Breath, Other (See Comments)   Shuts patient's throat.   Avocado Hives        Medication List        Accurate as of November 01, 2021  5:29 PM. If you have any questions, ask your nurse or doctor.  STOP taking these medications    acetaminophen 325 MG tablet Commonly known as: Tylenol Stopped by: Felix Pacini, DO       TAKE these medications    AirDuo Digihaler 113-14 MCG/ACT Aepb Generic drug: Fluticasone-Salmeterol(sensor) Inhale 1 puff into the lungs 2 (two) times daily. rinse mouth after each use   benzonatate 200 MG capsule Commonly known as: TESSALON Take 1 capsule (200 mg total) by mouth 2 (two) times daily as needed for cough. Started by: Felix Pacini, DO   diphenhydrAMINE 25 MG tablet Commonly known as: BENADRYL Take 1 tablet (25 mg total) by mouth every 6 (six) hours as needed for up to 30 doses for itching or allergies.   doxycycline 100 MG tablet Commonly known as: VIBRA-TABS Take 1 tablet (100 mg total) by mouth 2 (two) times  daily. Started by: Felix Pacini, DO   fluticasone 50 MCG/ACT nasal spray Commonly known as: FLONASE Place 1-2 sprays into both nostrils daily as needed for rhinitis.   hydrOXYzine 50 MG capsule Commonly known as: Vistaril Take 1-2 capsules (50-100 mg total) by mouth at bedtime as needed. Started by: Felix Pacini, DO   levocetirizine 5 MG tablet Commonly known as: XYZAL TAKE 1 TABLET BY MOUTH TWICE DAILY AS NEEDED FOR ALLERGIES (HIVES).   predniSONE 20 MG tablet Commonly known as: DELTASONE Take 2 tablets (40 mg total) by mouth daily with breakfast. Started by: Felix Pacini, DO   ProAir Digihaler 108 [phone] Base) MCG/ACT Aepb Generic drug: Albuterol Sulfate (sensor) Inhale 2 puffs into the lungs as needed (coughing, wheezing, shortness of breath).        All past medical history, surgical history, allergies, family history, immunizations andmedications were updated in the EMR today and reviewed under the history and medication portions of their EMR.    Recent Results (from the past 2160 hour(s))  POCT rapid strep A     Status: Normal   Collection Time: 11/01/21 10:01 AM  Result Value Ref Range   Rapid Strep A Screen Negative Negative  POCT Influenza A/B     Status: Normal   Collection Time: 11/01/21 10:01 AM  Result Value Ref Range   Influenza A, POC Negative Negative   Influenza B, POC Negative Negative  POC COVID-19 BinaxNow     Status: Normal   Collection Time: 11/01/21 10:02 AM  Result Value Ref Range   SARS Coronavirus 2 Ag Negative Negative     Review of Systems  Constitutional:  Positive for malaise/fatigue. Negative for chills and fever.  HENT:  Positive for congestion, sinus pain and sore throat. Negative for ear discharge, ear pain and nosebleeds.   Eyes:  Negative for pain, discharge and redness.  Respiratory:  Positive for cough, sputum production and wheezing.   Gastrointestinal:  Negative for abdominal pain, diarrhea, nausea and vomiting.   Musculoskeletal:  Negative for myalgias.  Skin:  Negative for rash.  14 pt review of systems performed and negative (unless mentioned in an HPI)  Objective:  BP 135/83    Pulse 88    Temp 98.5 F (36.9 C) (Oral)    Ht 5\' 2"  (1.575 m)    Wt 158 lb (71.7 kg)    LMP 10/23/2021    SpO2 99%    BMI 28.90 kg/m   Physical Exam Vitals and nursing note reviewed.  Constitutional:      General: She is not in acute distress.    Appearance: Normal appearance. She is not ill-appearing, toxic-appearing or diaphoretic.  HENT:  Head: Normocephalic and atraumatic.     Right Ear: Tympanic membrane and ear canal normal. There is no impacted cerumen.     Left Ear: Tympanic membrane and ear canal normal. There is no impacted cerumen.     Nose: Congestion and rhinorrhea present.     Mouth/Throat:     Mouth: Mucous membranes are moist.     Pharynx: No oropharyngeal exudate or posterior oropharyngeal erythema.  Eyes:     General: No scleral icterus.       Right eye: No discharge.        Left eye: No discharge.     Extraocular Movements: Extraocular movements intact.     Conjunctiva/sclera: Conjunctivae normal.     Pupils: Pupils are equal, round, and reactive to light.  Cardiovascular:     Rate and Rhythm: Normal rate and regular rhythm.     Pulses: Normal pulses.     Heart sounds: Normal heart sounds.  Pulmonary:     Effort: Pulmonary effort is normal. No respiratory distress.     Breath sounds: Wheezing present. No rhonchi or rales.  Musculoskeletal:     Cervical back: Neck supple. No tenderness.  Lymphadenopathy:     Cervical: No cervical adenopathy.  Skin:    General: Skin is warm and dry.     Coloration: Skin is not jaundiced or pale.     Findings: No erythema or rash.  Neurological:     Mental Status: She is alert and oriented to person, place, and time. Mental status is at baseline.     Motor: No weakness.     Gait: Gait normal.  Psychiatric:        Mood and Affect: Mood normal.         Behavior: Behavior normal.        Thought Content: Thought content normal.        Judgment: Judgment normal.     Results for orders placed or performed in visit on 11/01/21 (from the past 24 hour(s))  POCT rapid strep A     Status: Normal   Collection Time: 11/01/21 10:01 AM  Result Value Ref Range   Rapid Strep A Screen Negative Negative  POCT Influenza A/B     Status: Normal   Collection Time: 11/01/21 10:01 AM  Result Value Ref Range   Influenza A, POC Negative Negative   Influenza B, POC Negative Negative  POC COVID-19 BinaxNow     Status: Normal   Collection Time: 11/01/21 10:02 AM  Result Value Ref Range   SARS Coronavirus 2 Ag Negative Negative    Assessment/plan: Alison Vega is a 40 y.o. female present for est care with acute concern.  Establishing care with new doctor, encounter for Acute cough/Acute pharyngitis, unspecified etiology/Moderate persistent asthmatic bronchitis with acute exacerbation - POC COVID-19 BinaxNow - POCT Influenza A/B - POCT rapid strep A - Upper Respiratory Culture Rest, hydrate.  Continue allergy regimen.   Doxycycline twice daily x10 days prescribed, take until completed.  Prednisone 40 x 5 days Vistaril 50-100 nightly Tessalon Perles for cough If cough present it can last up to 6-8 weeks.  F/U 2 weeks if not improved -sooner if worsening   Primary insomnia Briefly discussed her insomnia today, especially since we are prescribing a short course of steroid.  She has been on Seroquel, Vistaril, trazodone in the past per her report. Prescribed Vistaril 50-100 mg for her today.  She reports she is to be on an even higher dose with the  trazodone, therefore I doubt oversedation will occur. She will follow-up in about 4 to 6 weeks for her physical and we can taper on this dose at that time if needed and/or consider other agents.   Return in about 6 weeks (around 12/16/2021) for CPE (30 min) with fasting labs.  Orders Placed This  Encounter  Procedures   Upper Respiratory Culture   POCT rapid strep A   POC COVID-19 BinaxNow   POCT Influenza A/B   Meds ordered this encounter  Medications   predniSONE (DELTASONE) 20 MG tablet    Sig: Take 2 tablets (40 mg total) by mouth daily with breakfast.    Dispense:  10 tablet    Refill:  0   doxycycline (VIBRA-TABS) 100 MG tablet    Sig: Take 1 tablet (100 mg total) by mouth 2 (two) times daily.    Dispense:  20 tablet    Refill:  0   benzonatate (TESSALON) 200 MG capsule    Sig: Take 1 capsule (200 mg total) by mouth 2 (two) times daily as needed for cough.    Dispense:  20 capsule    Refill:  0   hydrOXYzine (VISTARIL) 50 MG capsule    Sig: Take 1-2 capsules (50-100 mg total) by mouth at bedtime as needed.    Dispense:  180 capsule    Refill:  1   Referral Orders  No referral(s) requested today     Note is dictated utilizing voice recognition software. Although note has been proof read prior to signing, occasional typographical errors still can be missed. If any questions arise, please do not hesitate to call for verification.  Electronically signed by: Felix Pacini, DO [facility] [place]

## 2021-11-03 LAB — CULTURE, UPPER RESPIRATORY
MICRO NUMBER:: 12813950
SPECIMEN QUALITY:: ADEQUATE

## 2021-11-12 ENCOUNTER — Other Ambulatory Visit: Payer: Self-pay

## 2021-11-12 ENCOUNTER — Encounter (HOSPITAL_COMMUNITY): Payer: Self-pay

## 2021-11-12 ENCOUNTER — Ambulatory Visit (HOSPITAL_COMMUNITY)
Admission: EM | Admit: 2021-11-12 | Discharge: 2021-11-12 | Disposition: A | Discharge status: Home / Self Care | Payer: BC Managed Care – PPO | Attending: Physician Assistant | Admitting: Physician Assistant

## 2021-11-12 DIAGNOSIS — M546 Pain in thoracic spine: Secondary | ICD-10-CM | POA: Diagnosis not present

## 2021-11-12 MED ORDER — PREDNISONE 10 MG (21) PO TBPK: ORAL_TABLET | ORAL | 0 refills | Status: DC

## 2021-11-12 MED ORDER — TIZANIDINE HCL 4 MG PO TABS: 4.0000 mg | ORAL_TABLET | Freq: Three times a day (TID) | ORAL | 0 refills | Status: DC | PRN

## 2021-11-12 NOTE — Discharge Instructions (Signed)
I think you have an injured muscle given how tender you are on exam.  Please start prednisone taper as we discussed.  Do not take NSAIDs including aspirin, ibuprofen/Advil, naproxen/Aleve with this medication as it can cause stomach bleeding.  You can take Tylenol.  Use heat, rest, stretch for additional symptom relief.  Take Zanaflex up to 3 times a day as needed.  This make you sleepy so do not drive or drink alcohol with taking it.  If you have any worsening symptoms including persistent pain, weakness, paresthesias please return for reevaluation.

## 2021-11-12 NOTE — ED Triage Notes (Signed)
Pt presents with neck pain, shoulder pain, and increased heart rate that began this afternoon

## 2021-11-12 NOTE — ED Provider Notes (Signed)
[facility]    CSN: [id number] Arrival date & time: 11/12/21  1926      History   Chief Complaint Chief Complaint  Patient presents with   Neck Pain   Shoulder Pain   increased heart rate    HPI Alison Vega is a 41 y.o. female.   Patient presents today with a several day history of neck and shoulder pain that is worsened when she woke up.  Reports that when she first woke up she experienced some heart racing as well as headache due to increased pain.  She denies any chest pain, shortness of breath, nausea, vomiting, fever.  Reports she is recently recovering from URI and was treated by her PCP with doxycycline and prednisone several weeks ago.  She does have a history of asthma but reports albuterol is currently managing symptoms.  She denies any known injury or increase in activity prior to symptom onset but has had a physically demanding job working at a [employer]).  Pain is rated 8 on a 0-10 pain scale, localized to specific area of right thoracic back, described as intense aching periodic sharp pains, worse with palpation or certain movements, no alleviating factors identified.  She denies any previous spinal surgery or injury.  She is having difficulty with daily activities as result of symptoms.  She has not tried over-the-counter medication for symptom management.  Denies any weakness, numbness, paresthesias in extremities.   Past Medical History:  Diagnosis Date   Asthma    Cellulitis    cellulitis/abcess x15, "every 2 months"   Urticaria     Patient Active Problem List   Diagnosis Date Noted   Primary insomnia 11/01/2021   Asthmatic bronchitis 11/01/2021   Moderate persistent asthma without complication [id number]   Chronic idiopathic urticaria 05/30/2021   Nonallergic rhinitis 09/13/2019    Past Surgical History:  Procedure Laterality Date   CESAREAN SECTION     EYE SURGERY     x3   OPEN REDUCTION INTERNAL FIXATION (ORIF) METACARPAL Right  06/13/2019   Procedure: OPEN TREATMENT OF RIGHT 5TH METACARPAL FRACTURE;  Surgeon: Milly Jakob, MD;  Location: [facility];  Service: Orthopedics;  Laterality: Right;   TUBAL LIGATION      OB History     Gravida  3   Para  3   Term      Preterm      AB      Living  3      SAB      IAB      Ectopic      Multiple      Live Births               Home Medications    Prior to Admission medications   Medication Sig Start Date End Date Taking? Authorizing Provider  Albuterol Sulfate, sensor, (PROAIR DIGIHALER) 108 (90 Base) MCG/ACT AEPB Inhale 2 puffs into the lungs as needed (coughing, wheezing, shortness of breath). 10/01/21   Garnet Sierras, DO  benzonatate (TESSALON) 200 MG capsule Take 1 capsule (200 mg total) by mouth 2 (two) times daily as needed for cough. 11/01/21   Kuneff, Renee A, DO  diphenhydrAMINE (BENADRYL) 25 MG tablet Take 1 tablet (25 mg total) by mouth every 6 (six) hours as needed for up to 30 doses for itching or allergies. Patient not taking: Reported on 11/12/2021 09/04/19   Wyvonnia Dusky, MD  doxycycline (VIBRA-TABS) 100 MG tablet Take 1 tablet (100  mg total) by mouth 2 (two) times daily. Patient not taking: Reported on 11/12/2021 11/01/21   Howard Pouch A, DO  fluticasone (FLONASE) 50 MCG/ACT nasal spray Place 1-2 sprays into both nostrils daily as needed for rhinitis. 05/30/21   Garnet Sierras, DO  Fluticasone-Salmeterol,sensor, (AIRDUO DIGIHALER) [phone] MCG/ACT AEPB Inhale 1 puff into the lungs 2 (two) times daily. rinse mouth after each use 10/01/21   Garnet Sierras, DO  hydrOXYzine (VISTARIL) 50 MG capsule Take 1-2 capsules (50-100 mg total) by mouth at bedtime as needed. 11/01/21   Kuneff, Renee A, DO  levocetirizine (XYZAL) 5 MG tablet TAKE 1 TABLET BY MOUTH TWICE DAILY AS NEEDED FOR ALLERGIES (HIVES). 10/01/21   Garnet Sierras, DO  predniSONE (STERAPRED UNI-PAK 21 TAB) 10 MG (21) TBPK tablet As directed 11/12/21   Kamelia Lampkins K, PA-C   tiZANidine (ZANAFLEX) 4 MG tablet Take 1 tablet (4 mg total) by mouth every 8 (eight) hours as needed for muscle spasms. 11/12/21   Arleigh Dicola, Derry Skill, PA-C    Family History Family History  Problem Relation Age of Onset   Diabetes Mother    Asthma Mother    Hepatitis C Mother    Cancer Father        unknown type   Asthma Son     Social History Social History   Tobacco Use   Smoking status: Never    Passive exposure: Never   Smokeless tobacco: Never  Vaping Use   Vaping Use: Never used  Substance Use Topics   Alcohol use: No   Drug use: No     Allergies   Penicillins, Sulfa antibiotics, and Avocado   Review of Systems Review of Systems  Constitutional:  Positive for activity change. Negative for appetite change, fatigue and fever.  Respiratory:  Positive for cough (chronic and at baseline). Negative for shortness of breath.   Cardiovascular:  Negative for palpitations (had initial episode with pain but resolved).  Gastrointestinal:  Negative for abdominal pain, diarrhea, nausea and vomiting.  Musculoskeletal:  Positive for arthralgias and back pain. Negative for myalgias.  Neurological:  Negative for dizziness, weakness, light-headedness, numbness and headaches.    Physical Exam Triage Vital Signs ED Triage Vitals  Enc Vitals Group     BP 11/12/21 1956 (!) 144/83     Pulse Rate 11/12/21 1956 75     Resp 11/12/21 1956 14     Temp 11/12/21 1956 97.9 F (36.6 C)     Temp Source 11/12/21 1956 Oral     SpO2 11/12/21 1956 100 %     Weight --      Height --      Head Circumference --      Peak Flow --      Pain Score 11/12/21 1958 8     Pain Loc --      Pain Edu? --      Excl. in [place]? --    No data found.  Updated Vital Signs BP (!) 144/83 (BP Location: Left Arm)    Pulse 75    Temp 97.9 F (36.6 C) (Oral)    Resp 14    LMP 10/23/2021    SpO2 100%   Visual Acuity Right Eye Distance:   Left Eye Distance:   Bilateral Distance:    Right Eye Near:   Left  Eye Near:    Bilateral Near:     Physical Exam Vitals reviewed.  Constitutional:      General: She is  awake. She is not in acute distress.    Appearance: Normal appearance. She is well-developed. She is not ill-appearing.     Comments: Very pleasant female appears stated age in no acute distress sitting comfortably in exam room  HENT:     Head: Normocephalic and atraumatic.     Right Ear: Tympanic membrane, ear canal and external ear normal. Tympanic membrane is not erythematous or bulging.     Left Ear: Tympanic membrane, ear canal and external ear normal. Tympanic membrane is not erythematous or bulging.     Nose:     Right Sinus: No maxillary sinus tenderness or frontal sinus tenderness.     Left Sinus: No maxillary sinus tenderness or frontal sinus tenderness.     Mouth/Throat:     Pharynx: Uvula midline. No oropharyngeal exudate or posterior oropharyngeal erythema.  Cardiovascular:     Rate and Rhythm: Normal rate and regular rhythm.     Heart sounds: Normal heart sounds, S1 normal and S2 normal. No murmur heard. Pulmonary:     Effort: Pulmonary effort is normal.     Breath sounds: Normal breath sounds. No wheezing, rhonchi or rales.     Comments: Clear to auscultation bilaterally Musculoskeletal:     Cervical back: No tenderness or bony tenderness.     Thoracic back: Spasms and tenderness present. No bony tenderness.     Lumbar back: No tenderness or bony tenderness.       Back:     Comments: Tenderness to palpation and spasm noted over right thoracic paraspinal muscles.  No deformity noted.  No pain percussion of vertebrae.  Normal active range of motion at right shoulder and neck.  Strength 5/5 bilateral upper extremities.  Psychiatric:        Behavior: Behavior is cooperative.     UC Treatments / Results  Labs (all labs ordered are listed, but only abnormal results are displayed) Labs Reviewed - No data to display  EKG   Radiology No results  found.  Procedures Procedures (including critical care time)  Medications Ordered in UC Medications - No data to display  Initial Impression / Assessment and Plan / UC Course  I have reviewed the triage vital signs and the nursing notes.  Pertinent labs & imaging results that were available during my care of the patient were reviewed by me and considered in my medical decision making (see chart for details).     No indication for plain films as patient had no bony tenderness and denies any recent trauma.  Concern for musculoskeletal etiology given muscular pain as well as spasm noted on exam.  She was started on prednisone taper with instruction not to take NSAIDs with this medication due to risk of GI bleeding.  She was prescribed Zanaflex up to 3 times a day as needed with instruction not to drive or drink alcohol taking this medication as drowsiness is a common side effect.  Recommended she use conservative treatment measures including heat, rest, stretch.  She was instructed to avoid strenuous activity and given work excuse note for several days.  Discussed that if she has any worsening symptoms including increased pain, weakness, paresthesias in extremities, recurrent heart racing particularly not related to pain, chest pain, shortness of breath she needs to be seen immediately.  Strict return precautions given to which she expressed understanding.  Final Clinical Impressions(s) / UC Diagnoses   Final diagnoses:  Acute right-sided thoracic back pain     Discharge Instructions  I think you have an injured muscle given how tender you are on exam.  Please start prednisone taper as we discussed.  Do not take NSAIDs including aspirin, ibuprofen/Advil, naproxen/Aleve with this medication as it can cause stomach bleeding.  You can take Tylenol.  Use heat, rest, stretch for additional symptom relief.  Take Zanaflex up to 3 times a day as needed.  This make you sleepy so do not drive or  drink alcohol with taking it.  If you have any worsening symptoms including persistent pain, weakness, paresthesias please return for reevaluation.     ED Prescriptions     Medication Sig Dispense Auth. Provider   predniSONE (STERAPRED UNI-PAK 21 TAB) 10 MG (21) TBPK tablet  (Status: Discontinued) As directed 21 tablet Roza Creamer K, PA-C   tiZANidine (ZANAFLEX) 4 MG tablet  (Status: Discontinued) Take 1 tablet (4 mg total) by mouth every 8 (eight) hours as needed for muscle spasms. 30 tablet Aaidyn San K, PA-C   predniSONE (STERAPRED UNI-PAK 21 TAB) 10 MG (21) TBPK tablet As directed 21 tablet Celestine Prim K, PA-C   tiZANidine (ZANAFLEX) 4 MG tablet Take 1 tablet (4 mg total) by mouth every 8 (eight) hours as needed for muscle spasms. 30 tablet Awanda Wilcock, Derry Skill, PA-C      PDMP not reviewed this encounter.   Terrilee Croak, PA-C 11/12/21 2038

## 2021-11-16 ENCOUNTER — Encounter (HOSPITAL_COMMUNITY): Payer: Self-pay

## 2021-11-16 ENCOUNTER — Emergency Department (HOSPITAL_COMMUNITY)
Admission: EM | Admit: 2021-11-16 | Discharge: 2021-11-16 | Disposition: A | Discharge status: Home / Self Care | Payer: BC Managed Care – PPO | Attending: Emergency Medicine | Admitting: Emergency Medicine

## 2021-11-16 ENCOUNTER — Other Ambulatory Visit: Payer: Self-pay

## 2021-11-16 DIAGNOSIS — M546 Pain in thoracic spine: Secondary | ICD-10-CM | POA: Insufficient documentation

## 2021-11-16 DIAGNOSIS — J45909 Unspecified asthma, uncomplicated: Secondary | ICD-10-CM | POA: Diagnosis not present

## 2021-11-16 DIAGNOSIS — M542 Cervicalgia: Secondary | ICD-10-CM | POA: Diagnosis not present

## 2021-11-16 LAB — BASIC METABOLIC PANEL
Anion gap: 8 (ref 5–15)
BUN: 17 mg/dL (ref 6–20)
CO2: 24 mmol/L (ref 22–32)
Calcium: 8.7 mg/dL — ABNORMAL LOW (ref 8.9–10.3)
Chloride: 106 mmol/L (ref 98–111)
Creatinine, Ser: 0.77 mg/dL (ref 0.44–1.00)
GFR, Estimated: >60 mL/min (ref >60)
Glucose, Bld: 95 mg/dL (ref 70–99)
Potassium: 4 mmol/L (ref 3.5–5.1)
Sodium: 138 mmol/L (ref 135–145)

## 2021-11-16 LAB — CBC WITH DIFFERENTIAL/PLATELET
Abs Immature Granulocytes: 0.03 10*3/uL (ref 0.00–0.07)
Basophils Absolute: 0.1 10*3/uL (ref 0.0–0.1)
Basophils Relative: 1 %
Eosinophils Absolute: 0.3 10*3/uL (ref 0.0–0.5)
Eosinophils Relative: 3 %
HCT: 43.3 % (ref 36.0–46.0)
Hemoglobin: 13.8 g/dL (ref 12.0–15.0)
Immature Granulocytes: 0 %
Lymphocytes Relative: 40 %
Lymphs Abs: 4.3 10*3/uL — ABNORMAL HIGH (ref 0.7–4.0)
MCH: 26.6 pg (ref 26.0–34.0)
MCHC: 31.9 g/dL (ref 30.0–36.0)
MCV: 83.6 fL (ref 80.0–100.0)
Monocytes Absolute: 0.6 10*3/uL (ref 0.1–1.0)
Monocytes Relative: 6 %
Neutro Abs: 5.4 10*3/uL (ref 1.7–7.7)
Neutrophils Relative %: 50 %
Platelets: 441 10*3/uL — ABNORMAL HIGH (ref 150–400)
RBC: 5.18 MIL/uL — ABNORMAL HIGH (ref 3.87–5.11)
RDW: 14.7 % (ref 11.5–15.5)
WBC: 10.7 10*3/uL — ABNORMAL HIGH (ref 4.0–10.5)
nRBC: 0 % (ref 0.0–0.2)

## 2021-11-16 MED ORDER — LIDOCAINE 5 % EX PTCH: 1.0000 | MEDICATED_PATCH | CUTANEOUS | 0 refills | Status: DC

## 2021-11-16 MED ORDER — METHOCARBAMOL 500 MG PO TABS: 500.0000 mg | ORAL_TABLET | Freq: Three times a day (TID) | ORAL | 0 refills | Status: DC | PRN

## 2021-11-16 NOTE — ED Provider Triage Note (Signed)
Emergency Medicine Provider Triage Evaluation Note  Alison Vega , a 42 y.o. female  was evaluated in triage.  Pt complains of upper thoracic back pain. The patient reports she is having pain with moving her neck and it is giving her a headache. She was seen at [facility] a few days ago and given prednisone and muscle relaxers which she reports has not helped. She went to retunt to UC but they were closed, so she came here. She denies any blurry vision, URI symptoms, or fever. Denies any weakness, numbness, or tingling. Denies any trauma , falls or MVCs to the area.   Review of Systems  Positive: Neck pain, headache Negative: Numbness, tingling, weakness, vision changes  Physical Exam  BP 123/87 (BP Location: Left Arm)    Pulse (!) 106    Temp 98.4 F (36.9 C) (Oral)    Resp 20    Ht 5\' 2"  (1.575 m)    Wt 72.6 kg    LMP 10/23/2021    SpO2 94%    BMI 29.26 kg/m  Gen:   Awake, no distress   Resp:  Normal effort  MSK:   Moves extremities without difficulty  Other:  Right sided paraspinal thoracic muscular tenderness to palpation. No bony abnormalities, step offs, or deformities palpated. The patient is able to flex and extend her neck fully with some pain.   Medical Decision Making  Medically screening exam initiated at 7:50 PM.  Appropriate orders placed.  Alison Vega was informed that the remainder of the evaluation will be completed by another provider, this initial triage assessment does not replace that evaluation, and the importance of remaining in the ED until their evaluation is complete.  Labs placed. Deferred imaging till patient is in the back.    Emilee Hero, Achille Rich 11/16/21 1953

## 2021-11-16 NOTE — ED Provider Notes (Signed)
[facility] Emergency Department Provider Note MRN:  [id number]  Arrival date & time: 11/16/21     Chief Complaint   Neck Pain and Back Pain   History of Present Illness   Alison Vega is a 41 y.o. year-old female with a history of asthma presenting to the ED with chief complaint of neck and back pain.  Pain in the right thoracic back near the shoulder blade for the past 4 or 5 days.  Woke up with some discomfort and has worsened.  Significantly worse with any movement of the neck or back or arm.  No shortness of breath, no chest pain, no numbness or weakness to the arms or legs, no bowel or bladder dysfunction.  On steroids and muscle relaxers but its not helping.  Review of Systems  A thorough review of systems was obtained and all systems are negative except as noted in the HPI and PMH.   Patient's Health History    Past Medical History:  Diagnosis Date   Asthma    Cellulitis    cellulitis/abcess x15, "every 2 months"   Urticaria     Past Surgical History:  Procedure Laterality Date   CESAREAN SECTION     EYE SURGERY     x3   OPEN REDUCTION INTERNAL FIXATION (ORIF) METACARPAL Right 06/13/2019   Procedure: OPEN TREATMENT OF RIGHT 5TH METACARPAL FRACTURE;  Surgeon: Milly Jakob, MD;  Location: [place];  Service: Orthopedics;  Laterality: Right;   TUBAL LIGATION      Family History  Problem Relation Age of Onset   Diabetes Mother    Asthma Mother    Hepatitis C Mother    Cancer Father        unknown type   Asthma Son     Social History   Socioeconomic History   Marital status: Single    Spouse name: Not on file   Number of children: Not on file   Years of education: Not on file   Highest education level: Not on file  Occupational History   Not on file  Tobacco Use   Smoking status: Never    Passive exposure: Never   Smokeless tobacco: Never  Vaping Use   Vaping Use: Never used  Substance and Sexual Activity    Alcohol use: No   Drug use: No   Sexual activity: Yes    Partners: Male    Birth control/protection: Surgical    Comment: BTL  Other Topics Concern   Not on file  Social History Narrative   Marital status/children/pets: Single. G3P3   Education/employment: HS, employed at [employer].    Safety:      -smoke alarm in the home:Yes     - wears seatbelt: Yes     - Feels safe in their relationships: Yes      Social Determinants of Health   Financial Resource Strain: Not on file  Food Insecurity: Not on file  Transportation Needs: Not on file  Physical Activity: Not on file  Stress: Not on file  Social Connections: Not on file  Intimate Partner Violence: Not on file     Physical Exam   Vitals:   11/16/21 1915  BP: 123/87  Pulse: (!) 106  Resp: 20  Temp: 98.4 F (36.9 C)  SpO2: 94%    CONSTITUTIONAL: Well-appearing, NAD NEURO/PSYCH:  Alert and oriented x 3, no focal deficits EYES:  eyes equal and reactive ENT/NECK:  no LAD, no JVD CARDIO:  Regular rate, well-perfused, normal S1 and S2 PULM:  CTAB no wheezing or rhonchi GI/GU:  non-distended, non-tender MSK/SPINE:  No gross deformities, no edema; focal tenderness and reproducibility of the pain at the right thoracic back SKIN:  no rash, atraumatic   *Additional and/or pertinent findings included in MDM below  Diagnostic and Interventional Summary    EKG Interpretation  Date/Time:    Ventricular Rate:    PR Interval:    QRS Duration:   QT Interval:    QTC Calculation:   R Axis:     Text Interpretation:         Labs Reviewed  BASIC METABOLIC PANEL - Abnormal; Notable for the following components:      Result Value   Calcium 8.7 (*)    All other components within normal limits  CBC WITH DIFFERENTIAL/PLATELET - Abnormal; Notable for the following components:   WBC 10.7 (*)    RBC 5.18 (*)    Platelets 441 (*)    Lymphs Abs 4.3 (*)    All other components within normal limits    No orders to  display    Medications - No data to display   Procedures  /  Critical Care Procedures  ED Course and Medical Decision Making  Initial Impression and Ddx Highly suspect MSK strain or spasm.  Does not have any shortness of breath or chest pain.  Lungs are clear, present, and equal in all lung fields, highly doubt pneumothorax or any cardiopulmonary process.  Appropriate for further symptomatic management.  No signs or symptoms of myelopathy.  Past medical/surgical history that increases complexity of ED encounter: None  Interpretation of Diagnostics Not applicable  Patient Reassessment and Ultimate Disposition/Management Discharge home  Patient management required discussion with the following services or consulting groups:  None  Complexity of Problems Addressed Acute uncomplicated illness or injury with no diagnostics  Additional Data Reviewed and Analyzed Further history obtained from: Past medical history and medications listed in the EMR  Factors Impacting ED Encounter Risk Prescriptions  Barth Kirks. Sedonia Small, [place] [email or web address]  Final Clinical Impressions(s) / ED Diagnoses     ICD-10-CM   1. Acute right-sided thoracic back pain  M54.6       ED Discharge Orders          Ordered    methocarbamol (ROBAXIN) 500 MG tablet  Every 8 hours PRN        11/16/21 2337    lidocaine (LIDODERM) 5 %  Every 24 hours        11/16/21 2337             Discharge Instructions Discussed with and Provided to Patient:    Discharge Instructions      You were evaluated in the Emergency Department and after careful evaluation, we did not find any emergent condition requiring admission or further testing in the hospital.  Your exam/testing today is overall reassuring.  Symptoms seem to be due to a muscle strain or spasm.  Recommend finishing your prescription for prednisone.  You can stop taking the Zanaflex and try the  Robaxin instead.  You can use the numbing patches as needed.  Recommend Tylenol 1000 mg every 4-6 hours.  Recommend warm compresses and/or light massage or stretching.  Please return to the Emergency Department if you experience any worsening of your condition.   Thank you for allowing [place] to be a part of your care.  Maudie Flakes, MD 11/16/21 [phone]

## 2021-11-16 NOTE — Discharge Instructions (Signed)
You were evaluated in the Emergency Department and after careful evaluation, we did not find any emergent condition requiring admission or further testing in the hospital.  Your exam/testing today is overall reassuring.  Symptoms seem to be due to a muscle strain or spasm.  Recommend finishing your prescription for prednisone.  You can stop taking the Zanaflex and try the Robaxin instead.  You can use the numbing patches as needed.  Recommend Tylenol 1000 mg every 4-6 hours.  Recommend warm compresses and/or light massage or stretching.  Please return to the Emergency Department if you experience any worsening of your condition.   Thank you for allowing [place] to be a part of your care.

## 2021-11-16 NOTE — ED Triage Notes (Signed)
Patient went to urgent care because her right side of the neck hurts and is swollen. She was given muscle relaxers and prednisone. But it hurts to bad even to yawn, she said her head feels like it will pop off. She was told when it gets worse to come back.

## 2021-12-17 ENCOUNTER — Other Ambulatory Visit: Payer: Self-pay

## 2021-12-17 ENCOUNTER — Encounter: Payer: Self-pay | Admitting: Family Medicine

## 2021-12-17 ENCOUNTER — Ambulatory Visit (INDEPENDENT_AMBULATORY_CARE_PROVIDER_SITE_OTHER): Payer: BC Managed Care – PPO | Admitting: Family Medicine

## 2021-12-17 VITALS — BP 124/69 | HR 68 | Temp 98.3°F | Ht 62.0 in | Wt 153.0 lb

## 2021-12-17 DIAGNOSIS — E559 Vitamin D deficiency, unspecified: Secondary | ICD-10-CM | POA: Diagnosis not present

## 2021-12-17 DIAGNOSIS — Z131 Encounter for screening for diabetes mellitus: Secondary | ICD-10-CM | POA: Diagnosis not present

## 2021-12-17 DIAGNOSIS — Z0000 Encounter for general adult medical examination without abnormal findings: Secondary | ICD-10-CM | POA: Diagnosis not present

## 2021-12-17 DIAGNOSIS — Z130 Encounter for screening for diseases of the blood and blood-forming organs and certain disorders involving the immune mechanism: Secondary | ICD-10-CM

## 2021-12-17 DIAGNOSIS — Z8742 Personal history of other diseases of the female genital tract: Secondary | ICD-10-CM | POA: Insufficient documentation

## 2021-12-17 DIAGNOSIS — F3340 Major depressive disorder, recurrent, in remission, unspecified: Secondary | ICD-10-CM | POA: Insufficient documentation

## 2021-12-17 DIAGNOSIS — F5101 Primary insomnia: Secondary | ICD-10-CM | POA: Diagnosis not present

## 2021-12-17 DIAGNOSIS — Z13220 Encounter for screening for lipoid disorders: Secondary | ICD-10-CM | POA: Diagnosis not present

## 2021-12-17 DIAGNOSIS — N393 Stress incontinence (female) (male): Secondary | ICD-10-CM

## 2021-12-17 HISTORY — DX: Personal history of other diseases of the female genital tract: Z87.42

## 2021-12-17 LAB — HEMOGLOBIN A1C: Hgb A1c MFr Bld: 5.7 % (ref 4.6–6.5)

## 2021-12-17 LAB — TSH: TSH: 1.21 u[IU]/mL (ref 0.35–5.50)

## 2021-12-17 LAB — CBC
HCT: 39 % (ref 36.0–46.0)
Hemoglobin: 12.6 g/dL (ref 12.0–15.0)
MCHC: 32.3 g/dL (ref 30.0–36.0)
MCV: 80.3 fl (ref 78.0–100.0)
Platelets: 398 10*3/uL (ref 150.0–400.0)
RBC: 4.86 Mil/uL (ref 3.87–5.11)
RDW: 15.7 % — ABNORMAL HIGH (ref 11.5–15.5)
WBC: 8.6 10*3/uL (ref 4.0–10.5)

## 2021-12-17 LAB — VITAMIN D 25 HYDROXY (VIT D DEFICIENCY, FRACTURES): VITD: 13.77 ng/mL — ABNORMAL LOW (ref 30.00–100.00)

## 2021-12-17 MED ORDER — HYDROXYZINE PAMOATE 50 MG PO CAPS: 50.0000 mg | ORAL_CAPSULE | Freq: Every evening | ORAL | 3 refills | Status: DC | PRN

## 2021-12-17 NOTE — Patient Instructions (Addendum)
Follow up in 1 year for physical and insomnia.Sooner if needed.  I have provided you with enough refills of sleep medicine to last you until that time.     Great to see you today.  I have refilled the medication(s) we provide.   If labs were collected, we will inform you of lab results once received either by echart message or telephone call.   - echart message- for normal results that have been seen by the patient already.   - telephone call: abnormal results or if patient has not viewed results in their echart.  Health Maintenance, Female Adopting a healthy lifestyle and getting preventive care are important in promoting health and wellness. Ask your health care provider about: The right schedule for you to have regular tests and exams. Things you can do on your own to prevent diseases and keep yourself healthy. What should I know about diet, weight, and exercise? Eat a healthy diet  Eat a diet that includes plenty of vegetables, fruits, low-fat dairy products, and lean protein. Do not eat a lot of foods that are high in solid fats, added sugars, or sodium. Maintain a healthy weight Body mass index (BMI) is used to identify weight problems. It estimates body fat based on height and weight. Your health care provider can help determine your BMI and help you achieve or maintain a healthy weight. Get regular exercise Get regular exercise. This is one of the most important things you can do for your health. Most adults should: Exercise for at least 150 minutes each week. The exercise should increase your heart rate and make you sweat (moderate-intensity exercise). Do strengthening exercises at least twice a week. This is in addition to the moderate-intensity exercise. Spend less time sitting. Even light physical activity can be beneficial. Watch cholesterol and blood lipids Have your blood tested for lipids and cholesterol at 39 years of age, then have this test every 5 years. Have your  cholesterol levels checked more often if: Your lipid or cholesterol levels are high. You are older than 39 years of age. You are at high risk for heart disease. What should I know about cancer screening? Depending on your health history and family history, you may need to have cancer screening at various ages. This may include screening for: Breast cancer. Cervical cancer. Colorectal cancer. Skin cancer. Lung cancer. What should I know about heart disease, diabetes, and high blood pressure? Blood pressure and heart disease High blood pressure causes heart disease and increases the risk of stroke. This is more likely to develop in people who have high blood pressure readings or are overweight. Have your blood pressure checked: Every 3-5 years if you are 82-57 years of age. Every year if you are 61 years old or older. Diabetes Have regular diabetes screenings. This checks your fasting blood sugar level. Have the screening done: Once every three years after age 69 if you are at a normal weight and have a low risk for diabetes. More often and at a younger age if you are overweight or have a high risk for diabetes. What should I know about preventing infection? Hepatitis B If you have a higher risk for hepatitis B, you should be screened for this virus. Talk with your health care provider to find out if you are at risk for hepatitis B infection. Hepatitis C Testing is recommended for: Everyone born from [birth year] through 1963. Anyone with known risk factors for hepatitis C. Sexually transmitted infections (STIs) Get screened  for STIs, including gonorrhea and chlamydia, if: You are sexually active and are younger than 39 years of age. You are older than 39 years of age and your health care provider tells you that you are at risk for this type of infection. Your sexual activity has changed since you were last screened, and you are at increased risk for chlamydia or gonorrhea. Ask your health care  provider if you are at risk. Ask your health care provider about whether you are at high risk for HIV. Your health care provider may recommend a prescription medicine to help prevent HIV infection. If you choose to take medicine to prevent HIV, you should first get tested for HIV. You should then be tested every 3 months for as long as you are taking the medicine. Pregnancy If you are about to stop having your period (premenopausal) and you may become pregnant, seek counseling before you get pregnant. Take 400 to 800 micrograms (mcg) of folic acid every day if you become pregnant. Ask for birth control (contraception) if you want to prevent pregnancy. Osteoporosis and menopause Osteoporosis is a disease in which the bones lose minerals and strength with aging. This can result in bone fractures. If you are 40 years old or older, or if you are at risk for osteoporosis and fractures, ask your health care provider if you should: Be screened for bone loss. Take a calcium or vitamin D supplement to lower your risk of fractures. Be given hormone replacement therapy (HRT) to treat symptoms of menopause. Follow these instructions at home: Alcohol use Do not drink alcohol if: Your health care provider tells you not to drink. You are pregnant, may be pregnant, or are planning to become pregnant. If you drink alcohol: Limit how much you have to: 0-1 drink a day. Know how much alcohol is in your drink. In the [place] one drink equals one 12 oz bottle of beer (355 mL), one 5 oz glass of wine (148 mL), or one 1 oz glass of hard liquor (44 mL). Lifestyle Do not use any products that contain nicotine or tobacco. These products include cigarettes, chewing tobacco, and vaping devices, such as e-cigarettes. If you need help quitting, ask your health care provider. Do not use street drugs. Do not share needles. Ask your health care provider for help if you need support or information about quitting drugs. General  instructions Schedule regular health, dental, and eye exams. Stay current with your vaccines. Tell your health care provider if: You often feel depressed. You have ever been abused or do not feel safe at home. Summary Adopting a healthy lifestyle and getting preventive care are important in promoting health and wellness. Follow your health care provider's instructions about healthy diet, exercising, and getting tested or screened for diseases. Follow your health care provider's instructions on monitoring your cholesterol and blood pressure. This information is not intended to replace advice given to you by your health care provider. Make sure you discuss any questions you have with your health care provider. Document Revised: 03/11/2021 Document Reviewed: 03/11/2021 Elsevier Patient Education  2022 [employer].

## 2021-12-17 NOTE — Progress Notes (Signed)
This visit occurred during the SARS-CoV-2 public health emergency.  Safety protocols were in place, including screening questions prior to the visit, additional usage of staff PPE, and extensive cleaning of exam room while observing appropriate contact time as indicated for disinfecting solutions.    Patient ID: Alison Vega, female  DOB: [birth year], 41 y.o.   MRN: [id number] Patient Care Team    Relationship Specialty Notifications Start End  Natalia Leatherwood, DO PCP - General Family Medicine  10/01/21   Ellamae Sia, DO Consulting Physician Allergy  11/01/21     Chief Complaint  Patient presents with   Annual Exam    [name]    Subjective: Alison Vega is a 41 y.o.  Female  present for CPE/CMC. All past medical history, surgical history, allergies, family history, immunizations, medications and social history were updated in the electronic medical record today. All recent labs, ED visits and hospitalizations within the last year were reviewed.  Health maintenance:  Colonoscopy: no fhx- screen at 47 Mammogram: no fhx. Scree at 42 Cervical cancer screening: last pap: 2019> gyn referred. Also having mild stress incontinence.  Immunizations: tdap UTD 2022, Influenza UTD 2022 (encouraged yearly), covid vaccines completed Infectious disease screening: HIV  and Hep C completed 2019 DEXA: routine screen at 62 Assistive device: none Oxygen MWN:UUVO Patient has a Dental home. Hospitalizations/ED visits: reviewed  Primary insomnia Patient reports she is sleeping well with the start of vistaril 50-100 mg QHS. She would like to continue this medication. She denies daytime sedation.  Prior note:  Briefly discussed her insomnia today, especially since we are prescribing a short course of steroid.  She has been on Seroquel, Vistaril, trazodone in the past per her report. Prescribed Vistaril 50-100 mg for her today.  She reports she is to be on an even higher dose with the trazodone,  therefore I doubt oversedation will occur. She will follow-up in about 4 to 6 weeks for her physical and we can taper on this dose at that time if needed and/or consider other agents.  Depression screen [facility] 2/9 12/17/2021 11/01/2021  Decreased Interest 0 0  Down, Depressed, Hopeless 0 0  PHQ - 2 Score 0 0   No flowsheet data found.   Immunization History  Administered Date(s) Administered   Influenza-Unspecified 08/03/2021   Moderna Sars-Covid-2 Vaccination 02/15/2020, 03/09/2020, 09/26/2020   PNEUMOCOCCAL CONJUGATE-20 05/30/2021   Tdap 05/30/2021   Past Medical History:  Diagnosis Date   Asthma    Cellulitis    cellulitis/abcess x15, "every 2 months"   Urticaria    Allergies  Allergen Reactions   Penicillins Shortness Of Breath and Other (See Comments)    Shut patients throat.   Sulfa Antibiotics Shortness Of Breath and Other (See Comments)    Shuts patient's throat.   Avocado Hives   Past Surgical History:  Procedure Laterality Date   CESAREAN SECTION     EYE SURGERY     x3   OPEN REDUCTION INTERNAL FIXATION (ORIF) METACARPAL Right 06/13/2019   Procedure: OPEN TREATMENT OF RIGHT 5TH METACARPAL FRACTURE;  Surgeon: Mack Hook, MD;  Location: [facility];  Service: Orthopedics;  Laterality: Right;   TUBAL LIGATION     Family History  Problem Relation Age of Onset   Diabetes Mother    Asthma Mother    Hepatitis C Mother    Cancer Father        unknown type   Asthma Son    Social History  Social History Narrative   Marital status/children/pets: Single. G3P3   Education/employment: HS, employed at [employer].    Safety:      -smoke alarm in the home:Yes     - wears seatbelt: Yes     - Feels safe in their relationships: Yes       Allergies as of 12/17/2021       Reactions   Penicillins Shortness Of Breath, Other (See Comments)   Shut patients throat.   Sulfa Antibiotics Shortness Of Breath, Other (See Comments)   Shuts  patient's throat.   Avocado Hives        Medication List        Accurate as of December 17, 2021  8:40 AM. If you have any questions, ask your nurse or doctor.          STOP taking these medications    benzonatate 200 MG capsule Commonly known as: TESSALON Stopped by: Felix Pacini, DO   diphenhydrAMINE 25 MG tablet Commonly known as: BENADRYL Stopped by: Felix Pacini, DO   doxycycline 100 MG tablet Commonly known as: VIBRA-TABS Stopped by: Felix Pacini, DO   lidocaine 5 % Commonly known as: Lidoderm Stopped by: Felix Pacini, DO   methocarbamol 500 MG tablet Commonly known as: ROBAXIN Stopped by: Felix Pacini, DO   predniSONE 10 MG (21) Tbpk tablet Commonly known as: STERAPRED UNI-PAK 21 TAB Stopped by: Felix Pacini, DO       TAKE these medications    AirDuo Digihaler 113-14 MCG/ACT Aepb Generic drug: Fluticasone-Salmeterol(sensor) Inhale 1 puff into the lungs 2 (two) times daily. rinse mouth after each use   fluticasone 50 MCG/ACT nasal spray Commonly known as: FLONASE Place 1-2 sprays into both nostrils daily as needed for rhinitis.   hydrOXYzine 50 MG capsule Commonly known as: Vistaril Take 1-2 capsules (50-100 mg total) by mouth at bedtime as needed.   levocetirizine 5 MG tablet Commonly known as: XYZAL TAKE 1 TABLET BY MOUTH TWICE DAILY AS NEEDED FOR ALLERGIES (HIVES).   ProAir Digihaler 108 (90 Base) MCG/ACT Aepb Generic drug: Albuterol Sulfate (sensor) Inhale 2 puffs into the lungs as needed (coughing, wheezing, shortness of breath).        All past medical history, surgical history, allergies, family history, immunizations andmedications were updated in the EMR today and reviewed under the history and medication portions of their EMR.      No results found.   ROS 14 pt review of systems performed and negative (unless mentioned in an HPI)  Objective: BP 124/69    Pulse 68    Temp 98.3 F (36.8 C) (Oral)    Ht 5\' 2"  (1.575 m)     Wt 153 lb (69.4 kg)    SpO2 98%    BMI 27.98 kg/m  Physical Exam Vitals and nursing note reviewed.  Constitutional:      General: She is not in acute distress.    Appearance: Normal appearance. She is not ill-appearing or toxic-appearing.  HENT:     Head: Normocephalic and atraumatic.     Right Ear: Tympanic membrane, ear canal and external ear normal. There is no impacted cerumen.     Left Ear: Tympanic membrane, ear canal and external ear normal. There is no impacted cerumen.     Nose: No congestion or rhinorrhea.     Mouth/Throat:     Mouth: Mucous membranes are moist.     Pharynx: Oropharynx is clear. No oropharyngeal exudate or posterior oropharyngeal erythema.  Eyes:  General: No scleral icterus.       Right eye: No discharge.        Left eye: No discharge.     Extraocular Movements: Extraocular movements intact.     Conjunctiva/sclera: Conjunctivae normal.     Pupils: Pupils are equal, round, and reactive to light.  Cardiovascular:     Rate and Rhythm: Normal rate and regular rhythm.     Pulses: Normal pulses.     Heart sounds: Normal heart sounds. No murmur heard.   No friction rub. No gallop.  Pulmonary:     Effort: Pulmonary effort is normal. No respiratory distress.     Breath sounds: Normal breath sounds. No stridor. No wheezing, rhonchi or rales.  Chest:     Chest wall: No tenderness.  Abdominal:     General: Abdomen is flat. Bowel sounds are normal. There is no distension.     Palpations: Abdomen is soft. There is no mass.     Tenderness: There is no abdominal tenderness. There is no right CVA tenderness, left CVA tenderness, guarding or rebound.     Hernia: No hernia is present.  Musculoskeletal:        General: No swelling, tenderness or deformity. Normal range of motion.     Cervical back: Normal range of motion and neck supple. No rigidity or tenderness.     Right lower leg: No edema.     Left lower leg: No edema.  Lymphadenopathy:     Cervical: No  cervical adenopathy.  Skin:    General: Skin is warm and dry.     Coloration: Skin is not jaundiced or pale.     Findings: No bruising, erythema, lesion or rash.  Neurological:     General: No focal deficit present.     Mental Status: She is alert and oriented to person, place, and time. Mental status is at baseline.     Cranial Nerves: No cranial nerve deficit.     Sensory: No sensory deficit.     Motor: No weakness.     Coordination: Coordination normal.     Gait: Gait normal.     Deep Tendon Reflexes: Reflexes normal.  Psychiatric:        Mood and Affect: Mood normal.        Behavior: Behavior normal.        Thought Content: Thought content normal.        Judgment: Judgment normal.     No results found.  Assessment/plan: Liylah Bouwkamp is a 41 y.o. female present for CPE/CMC Primary insomnia Briefly discussed her insomnia today, especially since we are prescribing a short course of steroid.  She has been on Seroquel, Vistaril, trazodone in the past per her report. Prescribed Vistaril 50-100 mg for her today.  She reports she is to be on an even higher dose with the trazodone, therefore I doubt oversedation will occur. She will follow-up in about 4 to 6 weeks for her physical and we can taper on this dose at that time if needed and/or consider other agents.   Colonoscopy: no fhx- screen at 47 Mammogram: no fhx. Scree at 42 Cervical cancer screening: last pap: 2019 Immunizations: tdap UTD 2022, Influenza UTD 2022 (encouraged yearly), covid vaccines completed Infectious disease screening: HIV  and Hep C completed 2019 DEXA: routine screen at 62 Patient was encouraged to exercise greater than 150 minutes a week. Patient was encouraged to choose a diet filled with fresh fruits and vegetables, and lean meats. AVS  provided to patient today for education/recommendation on gender specific health and safety maintenance. Return in about 1 year (around 12/18/2022) for CPE (30 min), CMC  (30 min).  Orders Placed This Encounter  Procedures   CBC   Comprehensive metabolic panel   Hemoglobin A1c   Lipid panel   TSH   Vitamin D (25 hydroxy)   Meds ordered this encounter  Medications   hydrOXYzine (VISTARIL) 50 MG capsule    Sig: Take 1-2 capsules (50-100 mg total) by mouth at bedtime as needed.    Dispense:  180 capsule    Refill:  3   Referral Orders  No referral(s) requested today     Electronically signed by: Felix Pacinienee Khyre Germond, DO [facility] [place]

## 2021-12-18 LAB — LIPID PANEL
Cholesterol: 162 mg/dL (ref 0–200)
HDL: 65.4 mg/dL (ref >39.00)
LDL Cholesterol: 78 mg/dL (ref 0–99)
NonHDL: 96.16
Total CHOL/HDL Ratio: 2
Triglycerides: 93 mg/dL (ref 0.0–149.0)
VLDL: 18.6 mg/dL (ref 0.0–40.0)

## 2021-12-18 LAB — COMPREHENSIVE METABOLIC PANEL
ALT: 19 U/L (ref 0–35)
AST: 20 U/L (ref 0–37)
Albumin: 4.4 g/dL (ref 3.5–5.2)
Alkaline Phosphatase: 41 U/L (ref 39–117)
BUN: 13 mg/dL (ref 6–23)
CO2: 26 mEq/L (ref 19–32)
Calcium: 9.4 mg/dL (ref 8.4–10.5)
Chloride: 106 mEq/L (ref 96–112)
Creatinine, Ser: 0.66 mg/dL (ref 0.40–1.20)
GFR: 111.22 mL/min (ref >60.00)
Glucose, Bld: 87 mg/dL (ref 70–99)
Potassium: 4.1 mEq/L (ref 3.5–5.1)
Sodium: 141 mEq/L (ref 135–145)
Total Bilirubin: 0.4 mg/dL (ref 0.2–1.2)
Total Protein: 7.2 g/dL (ref 6.0–8.3)

## 2021-12-19 ENCOUNTER — Other Ambulatory Visit: Payer: Self-pay | Admitting: Family Medicine

## 2021-12-19 MED ORDER — VITAMIN D (ERGOCALCIFEROL) 1.25 MG (50000 UNIT) PO CAPS: 50000.0000 [IU] | ORAL_CAPSULE | ORAL | 4 refills | Status: DC

## 2022-01-24 ENCOUNTER — Other Ambulatory Visit (HOSPITAL_COMMUNITY)
Admission: RE | Admit: 2022-01-24 | Discharge: 2022-01-24 | Disposition: A | Discharge status: Home / Self Care | Payer: BC Managed Care – PPO | Source: Ambulatory Visit | Attending: Obstetrics and Gynecology | Admitting: Obstetrics and Gynecology

## 2022-01-24 ENCOUNTER — Other Ambulatory Visit: Payer: Self-pay

## 2022-01-24 ENCOUNTER — Encounter: Payer: Self-pay | Admitting: Obstetrics and Gynecology

## 2022-01-24 ENCOUNTER — Ambulatory Visit (INDEPENDENT_AMBULATORY_CARE_PROVIDER_SITE_OTHER): Payer: BC Managed Care – PPO | Admitting: Obstetrics and Gynecology

## 2022-01-24 VITALS — BP 125/83 | HR 61 | Ht 62.0 in | Wt 152.8 lb

## 2022-01-24 DIAGNOSIS — Z01419 Encounter for gynecological examination (general) (routine) without abnormal findings: Secondary | ICD-10-CM | POA: Insufficient documentation

## 2022-01-24 DIAGNOSIS — N393 Stress incontinence (female) (male): Secondary | ICD-10-CM

## 2022-01-24 MED ORDER — SOLIFENACIN SUCCINATE 5 MG PO TABS
5.0000 mg | ORAL_TABLET | Freq: Every day | ORAL | 5 refills | Status: DC
Start: 2022-01-24 — End: 2022-04-02

## 2022-01-24 NOTE — Progress Notes (Signed)
GYN/ Est care ?Last Pap 2015/2016 per pt: Abnormal Pap w/ LEEP 18 years ago, no abnormal Paps since then. ?Reports stress incontinence. ?Anxiety screen postive ?

## 2022-01-24 NOTE — Progress Notes (Signed)
Alison Vega is a 41 y.o. G40P3003 female here for a routine annual gynecologic exam.  Current complaints: SUI.   Denies abnormal vaginal bleeding, discharge, pelvic pain, problems with intercourse or other gynecologic concerns.  ?  ?Gynecologic History ?Patient's last menstrual period was 01/02/2022 (exact date). ?Contraception: tubal ligation ?Last Pap: 2016. Results were: normal ?Last mammogram: NA.  ? ?Obstetric History ?OB History  ?Gravida Para Term Preterm AB Living  ?3 3 3     3   ?SAB IAB Ectopic Multiple Live Births  ?        3  ?  ?# Outcome Date GA Lbr Len/2nd Weight Sex Delivery Anes PTL Lv  ?3 Term     M Vag-Spont   LIV  ?2 Term     M Vag-Spont   LIV  ?1 Term     F CS-LTranv   LIV  ? ? ?Past Medical History:  ?Diagnosis Date  ? Asthma   ? Cellulitis   ? cellulitis/abcess x15, "every 2 months"  ? Urticaria   ? ? ?Past Surgical History:  ?Procedure Laterality Date  ? CESAREAN SECTION    ? EYE SURGERY    ? x3  ? OPEN REDUCTION INTERNAL FIXATION (ORIF) METACARPAL Right 06/13/2019  ? Procedure: OPEN TREATMENT OF RIGHT 5TH METACARPAL FRACTURE;  Surgeon: 08/13/2019, MD;  Location: [facility];  Service: Orthopedics;  Laterality: Right;  ? TUBAL LIGATION    ? ? ?Current Outpatient Medications on File Prior to Visit  ?Medication Sig Dispense Refill  ? Albuterol Sulfate, sensor, (PROAIR DIGIHALER) 108 (90 Base) MCG/ACT AEPB Inhale 2 puffs into the lungs as needed (coughing, wheezing, shortness of breath). 1 each 2  ? fluticasone (FLONASE) 50 MCG/ACT nasal spray Place 1-2 sprays into both nostrils daily as needed for rhinitis. 16 g 5  ? Fluticasone-Salmeterol,sensor, (AIRDUO DIGIHALER) 113-14 MCG/ACT AEPB Inhale 1 puff into the lungs 2 (two) times daily. rinse mouth after each use 1 each 5  ? hydrOXYzine (VISTARIL) 50 MG capsule Take 1-2 capsules (50-100 mg total) by mouth at bedtime as needed. 180 capsule 3  ? levocetirizine (XYZAL) 5 MG tablet TAKE 1 TABLET BY MOUTH TWICE DAILY AS NEEDED FOR  ALLERGIES (HIVES). 180 tablet 2  ? Vitamin D, Ergocalciferol, (DRISDOL) 1.25 MG (50000 UNIT) CAPS capsule Take 1 capsule (50,000 Units total) by mouth every 7 (seven) days. 12 capsule 4  ? ?No current facility-administered medications on file prior to visit.  ? ? ?Allergies  ?Allergen Reactions  ? Penicillins Shortness Of Breath and Other (See Comments)  ?  Shut patients throat.  ? Sulfa Antibiotics Shortness Of Breath and Other (See Comments)  ?  Shuts patient's throat.  ? Avocado Hives  ? ? ?Social History  ? ?Socioeconomic History  ? Marital status: Single  ?  Spouse name: Not on file  ? Number of children: Not on file  ? Years of education: Not on file  ? Highest education level: Not on file  ?Occupational History  ? Not on file  ?Tobacco Use  ? Smoking status: Never  ?  Passive exposure: Never  ? Smokeless tobacco: Never  ?Vaping Use  ? Vaping Use: Never used  ?Substance and Sexual Activity  ? Alcohol use: No  ? Drug use: No  ? Sexual activity: Not Currently  ?  Partners: Male  ?  Birth control/protection: Surgical  ?  Comment: BTL  ?Other Topics Concern  ? Not on file  ?Social History Narrative  ? Marital  status/children/pets: Single. G3P3  ? Education/employment: HS, employed at [employer].   ? Safety:   ?   -smoke alarm in the home:Yes  ?   - wears seatbelt: Yes  ?   - Feels safe in their relationships: Yes  ?   ? ?Social Determinants of Health  ? ?Financial Resource Strain: Not on file  ?Food Insecurity: Not on file  ?Transportation Needs: Not on file  ?Physical Activity: Not on file  ?Stress: Not on file  ?Social Connections: Not on file  ?Intimate Partner Violence: Not on file  ? ? ?Family History  ?Problem Relation Age of Onset  ? Cancer Father   ?     unknown type  ? Lung cancer Father   ? Diabetes Mother   ? Asthma Mother   ? Hepatitis C Mother   ? Asthma Son   ? ? ?The following portions of the patient's history were reviewed and updated as appropriate: allergies, current medications, past  family history, past medical history, past social history, past surgical history and problem list. ? ?Review of Systems ?Pertinent items noted in HPI and remainder of comprehensive ROS otherwise negative. ?  ?Objective:  ?BP 125/83   Pulse 61   Ht 5\' 2"  (1.575 m)   Wt 152 lb 12.8 oz (69.3 kg)   LMP 01/02/2022 (Exact Date)   BMI 27.95 kg/m?  ?CONSTITUTIONAL: Well-developed, well-nourished female in no acute distress.  ?HENT:  Normocephalic, atraumatic, External right and left ear normal. Oropharynx is clear and moist ?EYES: Conjunctivae and EOM are normal. Pupils are equal, round, and reactive to light. No scleral icterus.  ?NECK: Normal range of motion, supple, no masses.  Normal thyroid.  ?SKIN: Skin is warm and dry. No rash noted. Not diaphoretic. No erythema. No pallor. ?NEUROLGIC: Alert and oriented to person, place, and time. Normal reflexes, muscle tone coordination. No cranial nerve deficit noted. ?PSYCHIATRIC: Normal mood and affect. Normal behavior. Normal judgment and thought content. ?CARDIOVASCULAR: Normal heart rate noted, regular rhythm ?RESPIRATORY: Clear to auscultation bilaterally. Effort and breath sounds normal, no problems with respiration noted. ?BREASTS: Symmetric in size. No masses, skin changes, nipple drainage, or lymphadenopathy. ?ABDOMEN: Soft, normal bowel sounds, no distention noted.  No tenderness, rebound or guarding.  ?PELVIC: Normal appearing external genitalia; normal appearing vaginal mucosa and cervix.  No abnormal discharge noted.  Pap smear obtained.  Normal uterine size, no other palpable masses, no uterine or adnexal tenderness. ?MUSCULOSKELETAL: Normal range of motion. No tenderness.  No cyanosis, clubbing, or edema.  2+ distal pulses. ? ? ?Assessment:  ?Annual gynecologic examination with pap smear ?SUI ?Plan:  ?Will follow up results of pap smear and manage accordingly. ?Discussed SUI with pt. Will start Vesicare. U/R/B reviewed. F/U in 2 months ?Routine preventative  health maintenance measures emphasized. ?Please refer to After Visit Summary for other counseling recommendations.  ? ? ?03/04/2022, MD, FACOG ?Attending Obstetrician & Gynecologist ?[facility], [facility]  ?

## 2022-01-24 NOTE — Patient Instructions (Signed)

## 2022-01-28 ENCOUNTER — Ambulatory Visit (INDEPENDENT_AMBULATORY_CARE_PROVIDER_SITE_OTHER): Payer: BC Managed Care – PPO | Admitting: Licensed Clinical Social Worker

## 2022-01-28 ENCOUNTER — Other Ambulatory Visit: Payer: Self-pay | Admitting: Obstetrics

## 2022-01-28 DIAGNOSIS — A5901 Trichomonal vulvovaginitis: Secondary | ICD-10-CM

## 2022-01-28 DIAGNOSIS — B3731 Acute candidiasis of vulva and vagina: Secondary | ICD-10-CM

## 2022-01-28 DIAGNOSIS — Z8659 Personal history of other mental and behavioral disorders: Secondary | ICD-10-CM | POA: Diagnosis not present

## 2022-01-28 LAB — CYTOLOGY - PAP
Comment: NEGATIVE
Diagnosis: NEGATIVE
High risk HPV: NEGATIVE

## 2022-01-28 MED ORDER — METRONIDAZOLE 500 MG PO TABS: 500.0000 mg | ORAL_TABLET | Freq: Two times a day (BID) | ORAL | 2 refills | Status: DC

## 2022-01-29 ENCOUNTER — Telehealth: Payer: Self-pay | Admitting: Emergency Medicine

## 2022-01-29 NOTE — Telephone Encounter (Signed)
Attempted call to discuss results and Rx. Left HIPAA compliant vm to return call to clinic.  ?

## 2022-01-29 NOTE — BH Specialist Note (Signed)
Integrated Behavioral Health via Telemedicine Visit ? ?01/29/2022 ?Alison Vega ?[id number] ? ?Number of Integrated Behavioral Health Clinician visits: 1 ?Session Start time:  9:00am ?Session End time: 9:23am ?Total time in minutes: 23 mins via mychart video  ? ?Referring Provider: Dr. Alysia Penna  ?Patient/Family location: Home ?[facility] Provider location: [facility]  ?All persons participating in visit: Pt Alison Vega and LCSW A. Felton Clinton  ?Types of Service: Individual psychotherapy and Video visit ? ?I connected with Alison Vega and/or Alison Vega n/a via  Telephone or [employer]  (Video is Surveyor, mining) and verified that I am speaking with the correct person using two identifiers. Discussed confidentiality: Yes  ? ?I discussed the limitations of telemedicine and the availability of in person appointments.  Discussed there is a possibility of technology failure and discussed alternative modes of communication if that failure occurs. ? ?I discussed that engaging in this telemedicine visit, they consent to the provision of behavioral healthcare and the services will be billed under their insurance. ? ?Patient and/or legal guardian expressed understanding and consented to Telemedicine visit: Yes  ? ?Presenting Concerns: ?Patient and/or family reports the following symptoms/concerns: hx of depression  ?Duration of problem: one year ; Severity of problem: mild ? ?Patient and/or Family's Strengths/Protective Factors: ?Concrete supports in place (healthy food, safe environments, etc.) ? ?Goals Addressed: ?Patient will: ? Reduce symptoms of: depression  ? Increase knowledge and/or ability of: coping skills  ? Demonstrate ability to: Increase healthy adjustment to current life circumstances ? ?Progress towards Goals: ?Ongoing ? ?Interventions: ?Interventions utilized:  Motivational Interviewing and Link to [facility] ?Standardized Assessments completed: PHQ 9 ? ?Patient and/or Family  Response: Alison Vega responded well to mychart video  ? ?Assessment: ?Alison Vega reports history of depression. Alison Vega desires assistance securing a behavioral health that accepts her insurance. Alison Vega reports anxious mood and social isolation.   ? ?Patient may benefit from community outpatient services. ? ?Plan: ?Follow up with behavioral health clinician on : prn ?Behavioral recommendations: Contact insurance provider to locate in network service provider, relaxation and deep breathing exercises, communicate needs with supportive family members for added support  ?Referral(s): Paramedic (LME/Outside Clinic) ? ?I discussed the assessment and treatment plan with the patient and/or parent/guardian. They were provided an opportunity to ask questions and all were answered. They agreed with the plan and demonstrated an understanding of the instructions. ?  ?They were advised to call back or seek an in-person evaluation if the symptoms worsen or if the condition fails to improve as anticipated. ? ?Alison Saxon, LCSW ?

## 2022-01-29 NOTE — Progress Notes (Signed)
? ?Follow Up Note ? ?RE: Alison Vega MRN: [id number] DOB: [birth year] ?Date of Office Visit: 01/30/2022 ? ?Referring provider: Ma Hillock, DO ?Primary care provider: Ma Hillock, DO ? ?Chief Complaint: Urticaria (No issues ) and Asthma (Some issues ) ? ?History of Present Illness: ?I had the pleasure of seeing Alison Vega for a follow up visit at the [facility] on 01/30/2022. She is a 41 y.o. female, who is being followed for chronic idiopathic urticaria, asthma, nonallergic rhinitis. Her previous allergy office visit was on 10/01/2021 with Dr. Maudie Mercury. Today is a regular follow up visit. ? ?Chronic idiopathic urticaria ?Broke out once since the last visit and resolved with antihistamine. ? ?Currently taking Xyzal 31m once a day for sinus issues.  ?  ?Moderate persistent asthma  ?Patient ran out of Airduo yesterday. Not sure of cost as she never filled it.  ?She did notice that it would help when she used it right before work. She only took it once a day. ?Used albuterol less since starting inhaler - about once every few weeks. ?Prednisone in December due to URI. ? ?Nonallergic rhinitis ?Currently on Xyzal and Flonase 1 spray per nostril once a day with good benefit. No nosebleeds. ? ?Assessment and Plan: ?Alison Vega a 41y.o. female with: ?Chronic idiopathic urticaria ?Past history - Lab evaluation unremarkable (FCeRI antibody, anti-thyroglobulin antibody, thyroid peroxidase antibody, tryptase, H. pylori serology, CBC, CMP, ESR, ANA, and alpha-gal panel). On Xolair in the past.  ?Interim history - one outbreak since the last visit which resolved with antihistamines. Only takes xyzal 5471monce a day. ?If you start breaking out again let usKoreanow and start the following medications:  ?Xyzal 71m771mwice a day. ?Pepcid (famotidine) 33m63mice a day.  ?Avoid the following potential triggers: alcohol, tight clothing, NSAIDs, hot showers and getting overheated. ? ?Moderate persistent asthma without  complication ?Past history - normal CXR in 2021. Symbicort too expensive. ?Interim history - improved with Airduo but only using once a day. Breo too expensive. Prednisone in December 2022.  ?Today's spirometry was normal.  ?Daily controller medication(s): Start Airduo 113 mcg 1 puff twice a day and rinse mouth after each use. Sample given.  ?[facility] too [facility]  ?May use albuterol rescue inhaler 2-4 puffs every 4 to 6 hours as needed for shortness of breath, chest tightness, coughing, and wheezing. May use albuterol rescue inhaler 2 puffs 5 to 15 minutes prior to strenuous physical activities. Monitor frequency of use.  ?Get spirometry at next visit. ? ?Nonallergic rhinitis ?Past history - 2020 skin testing negative to pediatric environmental panel.  ?Interim history - controlled with Flonase and Xyzal. ?Use Flonase (fluticasone) nasal spray 1 spray per nostril twice a day as needed for nasal congestion.  ?Nasal saline spray (i.e., Simply Saline) or nasal saline lavage (i.e., NeilMed) is recommended as needed and prior to medicated nasal sprays. ?May take Xyzal (levocetirizine) 71mg 16m times daily as needed.  ? ?Return in about 4 months (around 06/01/2022). ? ?Meds ordered this encounter  ?Medications  ? DISCONTD: fluticasone furoate-vilanterol (BREO ELLIPTA) 100-25 MCG/ACT AEPB  ?  Sig: Inhale 1 puff into the lungs daily. Rinse mouth after each use.  ?  Dispense:  60 each  ?  Refill:  3  ? Fluticasone-Salmeterol,sensor, (AIRDUO DIGIHOlmsted-14 MCG/ACT AEPB  ?  Sig: Inhale 1 puff into the lungs in the morning and at bedtime. Rinse  mouth after each use.  ?  Dispense:  1 each  ?  Refill:  5  ?  [phone]  ? ?Lab Orders  ?No laboratory test(s) ordered today  ? ? ?Diagnostics: ?Spirometry:  ?Tracings reviewed. Her effort: Good reproducible efforts. ?FVC: 2.93L ?FEV1: 2.74L, 93% predicted ?FEV1/FVC ratio: 94% ?Interpretation: Spirometry consistent with  normal pattern.  ?Please see scanned spirometry results for details. ? ?Medication List:  ?Current Outpatient Medications  ?Medication Sig Dispense Refill  ? Albuterol Sulfate, sensor, (PROAIR DIGIHALER) 108 (90 Base) MCG/ACT AEPB Inhale 2 puffs into the lungs as needed (coughing, wheezing, shortness of breath). 1 each 2  ? fluticasone (FLONASE) 50 MCG/ACT nasal spray Place 1-2 sprays into both nostrils daily as needed for rhinitis. 16 g 5  ? Fluticasone-Salmeterol,sensor, (AIRDUO DIGIHALER) 113-14 MCG/ACT AEPB Inhale 1 puff into the lungs in the morning and at bedtime. Rinse mouth after each use. 1 each 5  ? hydrOXYzine (VISTARIL) 50 MG capsule Take 1-2 capsules (50-100 mg total) by mouth at bedtime as needed. 180 capsule 3  ? levocetirizine (XYZAL) 5 MG tablet TAKE 1 TABLET BY MOUTH TWICE DAILY AS NEEDED FOR ALLERGIES (HIVES). 180 tablet 2  ? metroNIDAZOLE (FLAGYL) 500 MG tablet Take 1 tablet (500 mg total) by mouth 2 (two) times daily. 14 tablet 2  ? solifenacin (VESICARE) 5 MG tablet Take 1 tablet (5 mg total) by mouth daily. 30 tablet 5  ? Vitamin D, Ergocalciferol, (DRISDOL) 1.25 MG (50000 UNIT) CAPS capsule Take 1 capsule (50,000 Units total) by mouth every 7 (seven) days. 12 capsule 4  ? ?No current facility-administered medications for this visit.  ? ?Allergies: ?Allergies  ?Allergen Reactions  ? Penicillins Shortness Of Breath and Other (See Comments)  ?  Shut patients throat.  ? Sulfa Antibiotics Shortness Of Breath and Other (See Comments)  ?  Shuts patient's throat.  ? Avocado Hives  ? ?I reviewed her past medical history, social history, family history, and environmental history and no significant changes have been reported from her previous visit. ? ?Review of Systems  ?Constitutional:  Negative for appetite change, chills, fever and unexpected weight change.  ?HENT:  Negative for congestion and rhinorrhea.   ?Eyes:  Negative for itching.  ?Respiratory:  Positive for shortness of breath. Negative for  cough, chest tightness and wheezing.   ?Gastrointestinal:  Negative for abdominal pain.  ?Skin:  Negative for rash.  ?Allergic/Immunologic: Negative for environmental allergies and food allergies.  ?Neurological:  Negative for headaches.  ? ?Objective: ?BP 120/78   Pulse 73   Temp 98.5 ?F (36.9 ?C)   Resp 16   Ht _0  (1.575 m)   Wt 154 lb 6.4 oz (70 kg)   LMP 01/02/2022 (Exact Date)   SpO2 98%   BMI 28.24 kg/m?  ?Body mass index is 28.24 kg/m?Marland Kitchen ?Physical Exam ?Vitals and nursing note reviewed.  ?Constitutional:   ?   Appearance: Normal appearance. She is well-developed.  ?HENT:  ?   Head: Normocephalic and atraumatic.  ?   Right Ear: Tympanic membrane and external ear normal.  ?   Left Ear: Tympanic membrane and external ear normal.  ?   Nose: Nose normal.  ?   Mouth/Throat:  ?   Mouth: Mucous membranes are moist.  ?   Pharynx: Oropharynx is clear.  ?Eyes:  ?   Conjunctiva/sclera: Conjunctivae normal.  ?Cardiovascular:  ?   Rate and Rhythm: Normal rate and regular rhythm.  ?   Heart sounds: Normal heart sounds. No  murmur heard. ?  No friction rub. No gallop.  ?Pulmonary:  ?   Effort: Pulmonary effort is normal.  ?   Breath sounds: Normal breath sounds. No wheezing or rales.  ?Musculoskeletal:  ?   Cervical back: Neck supple.  ?Skin: ?   General: Skin is warm.  ?   Findings: No rash.  ?Neurological:  ?   Mental Status: She is alert and oriented to person, place, and time.  ?Psychiatric:     ?   Behavior: Behavior normal.  ?Previous notes and tests were reviewed. ?The plan was reviewed with the patient/family, and all questions/concerned were addressed. ? ?It was my pleasure to see [facility] today and participate in her care. Please feel free to contact me with any questions or concerns. ? ?Sincerely, ? ?Rexene Alberts, DO ?Allergy & Immunology ? ?[facility] [place] ?[place] office: [phone] ?[place] office: [phone] ?

## 2022-01-30 ENCOUNTER — Ambulatory Visit (INDEPENDENT_AMBULATORY_CARE_PROVIDER_SITE_OTHER): Payer: BC Managed Care – PPO | Admitting: Allergy

## 2022-01-30 ENCOUNTER — Encounter: Payer: Self-pay | Admitting: Allergy

## 2022-01-30 VITALS — BP 120/78 | HR 73 | Temp 98.5°F | Resp 16 | Ht 62.0 in | Wt 154.4 lb

## 2022-01-30 DIAGNOSIS — J4540 Moderate persistent asthma, uncomplicated: Secondary | ICD-10-CM | POA: Diagnosis not present

## 2022-01-30 DIAGNOSIS — J310 Chronic rhinitis: Secondary | ICD-10-CM | POA: Diagnosis not present

## 2022-01-30 DIAGNOSIS — L501 Idiopathic urticaria: Secondary | ICD-10-CM

## 2022-01-30 MED ORDER — AIRDUO DIGIHALER 113-14 MCG/ACT IN AEPB: 1.0000 | INHALATION_SPRAY | Freq: Two times a day (BID) | RESPIRATORY_TRACT | 5 refills | Status: DC

## 2022-01-30 MED ORDER — FLUTICASONE FUROATE-VILANTEROL 100-25 MCG/ACT IN AEPB: 1.0000 | INHALATION_SPRAY | Freq: Every day | RESPIRATORY_TRACT | 3 refills | Status: DC

## 2022-01-30 NOTE — Assessment & Plan Note (Signed)
Past history - Lab evaluation unremarkable (FCeRI antibody, anti-thyroglobulin antibody, thyroid peroxidase antibody, tryptase, H. pylori serology, CBC, CMP, ESR, ANA, and alpha-gal panel). On Xolair in the past.  ?Interim history - one outbreak since the last visit which resolved with antihistamines. Only takes xyzal 80m once a day. ?? If you start breaking out again let uKoreaknow and start the following medications:  ?? Xyzal 581mtwice a day. ?? Pepcid (famotidine) 2066mwice a day.  ?? Avoid the following potential triggers: alcohol, tight clothing, NSAIDs, hot showers and getting overheated. ?

## 2022-01-30 NOTE — Assessment & Plan Note (Signed)
Past history - 2020 skin testing negative to pediatric environmental panel.  Interim history - controlled with Flonase and Xyzal.  Use Flonase (fluticasone) nasal spray 1 spray per nostril twice a day as needed for nasal congestion.   Nasal saline spray (i.e., Simply Saline) or nasal saline lavage (i.e., NeilMed) is recommended as needed and prior to medicated nasal sprays.  May take Xyzal (levocetirizine) 5mg 1-2 times daily as needed.  

## 2022-01-30 NOTE — Patient Instructions (Addendum)
Urticaria ?If you start breaking out again let us know and start the following medications:  ?Xyzal 5mg  twice a day. ?Pepcid (famotidine) 20mg  twice a day.  ?Avoid the following potential triggers: alcohol, tight clothing, NSAIDs, hot showers and getting overheated. ? ?Chronic rhinitis ?Use Flonase (fluticasone) nasal spray 1 spray per nostril twice a day as needed for nasal congestion.  ?Nasal saline spray (i.e., Simply Saline) or nasal saline lavage (i.e., NeilMed) is recommended as needed and prior to medicated nasal sprays. ?May take Xyzal (levocetirizine) 5mg  1-2 times daily as needed.  ?  ?Asthma  ?Daily controller medication(s): Start Airduo 113 mcg 1 puff twice a day and rinse mouth after each use. Sample given.  ?This will [facility] you from [facility]. Let me know if too expensive.  ?May use albuterol rescue inhaler 2-4 puffs every 4 to 6 hours as needed for shortness of breath, chest tightness, coughing, and wheezing. May use albuterol rescue inhaler 2 puffs 5 to 15 minutes prior to strenuous physical activities. Monitor frequency of use.  ?Asthma control goals:  ?Full participation in all desired activities (may need albuterol before activity) ?Albuterol use two times or less a week on average (not counting use with activity) ?Cough interfering with sleep two times or less a month ?Oral steroids no more than once a year ?No hospitalizations ? ?Follow up in 4 months or sooner if needed.  ?

## 2022-01-30 NOTE — Assessment & Plan Note (Addendum)
Past history - normal CXR in 2021. Symbicort too expensive. ?Interim history - improved with Airduo but only using once a day. Breo too expensive. Prednisone in December 2022.  ?? Today's spirometry was normal.  ?? Daily controller medication(s): Start Airduo 113 mcg 1 puff twice a day and rinse mouth after each use. Sample given.  ? This will be [facility] to you [facility]. Let me know if too expensive.  ?? May use albuterol rescue inhaler 2-4 puffs every 4 to 6 hours as needed for shortness of breath, chest tightness, coughing, and wheezing. May use albuterol rescue inhaler 2 puffs 5 to 15 minutes prior to strenuous physical activities. Monitor frequency of use.  ?? Get spirometry at next visit. ?

## 2022-01-31 ENCOUNTER — Encounter: Payer: Self-pay | Admitting: *Deleted

## 2022-01-31 NOTE — Progress Notes (Signed)
Patient has not returned TCs. Pt has seen results in [facility]. MyChart message with information on Trich, RX and need for partner to be treated and abstinence until 7 days after treatment. Education on Trich included in message.

## 2022-04-02 ENCOUNTER — Encounter: Payer: Self-pay | Admitting: Obstetrics and Gynecology

## 2022-04-02 ENCOUNTER — Ambulatory Visit (INDEPENDENT_AMBULATORY_CARE_PROVIDER_SITE_OTHER): Payer: BC Managed Care – PPO | Admitting: Obstetrics and Gynecology

## 2022-04-02 VITALS — BP 129/86 | HR 77 | Ht 62.0 in | Wt 148.5 lb

## 2022-04-02 DIAGNOSIS — N393 Stress incontinence (female) (male): Secondary | ICD-10-CM | POA: Diagnosis not present

## 2022-04-02 MED ORDER — SOLIFENACIN SUCCINATE 5 MG PO TABS: 5.0000 mg | ORAL_TABLET | Freq: Every day | ORAL | 5 refills | Status: AC

## 2022-04-02 NOTE — Progress Notes (Signed)
Alison Vega presentd for f/u of her SUI. Started on Vesicare at last visit. Pt reports doing well. No more problems with SUI Tolerating Vesicare well without problems  PE AF VSS Lungs clear Heart RRR Abd soft + BS   A/P SUI  Improved with Vesiacare. Will continue with current management F/U in 6 months

## 2022-04-02 NOTE — Progress Notes (Signed)
Patient presents for a 2 month follow up for urinary incontinence. Patient states that she has good improvement with the Vesicare. Patient denies any other issue or concerns.  Last pap: 01/24/22 Normal

## 2022-05-15 ENCOUNTER — Emergency Department (HOSPITAL_COMMUNITY): Payer: BC Managed Care – PPO

## 2022-05-15 ENCOUNTER — Encounter (HOSPITAL_COMMUNITY): Payer: Self-pay

## 2022-05-15 ENCOUNTER — Emergency Department (HOSPITAL_COMMUNITY)
Admission: EM | Admit: 2022-05-15 | Discharge: 2022-05-15 | Disposition: A | Discharge status: Home / Self Care | Payer: BC Managed Care – PPO | Attending: Emergency Medicine | Admitting: Emergency Medicine

## 2022-05-15 ENCOUNTER — Other Ambulatory Visit: Payer: Self-pay

## 2022-05-15 DIAGNOSIS — M79604 Pain in right leg: Secondary | ICD-10-CM | POA: Diagnosis not present

## 2022-05-15 DIAGNOSIS — M5441 Lumbago with sciatica, right side: Secondary | ICD-10-CM | POA: Insufficient documentation

## 2022-05-15 DIAGNOSIS — N3000 Acute cystitis without hematuria: Secondary | ICD-10-CM | POA: Diagnosis not present

## 2022-05-15 DIAGNOSIS — M545 Low back pain, unspecified: Secondary | ICD-10-CM | POA: Diagnosis not present

## 2022-05-15 LAB — URINALYSIS, ROUTINE W REFLEX MICROSCOPIC
Bilirubin Urine: NEGATIVE
Glucose, UA: NEGATIVE mg/dL
Hgb urine dipstick: NEGATIVE
Ketones, ur: NEGATIVE mg/dL
Nitrite: NEGATIVE
Protein, ur: NEGATIVE mg/dL
Specific Gravity, Urine: 1.006 (ref 1.005–1.030)
pH: 6 (ref 5.0–8.0)

## 2022-05-15 LAB — POC URINE PREG, ED: Preg Test, Ur: NEGATIVE

## 2022-05-15 MED ORDER — HYDROCODONE-ACETAMINOPHEN 5-325 MG PO TABS
1.0000 | ORAL_TABLET | Freq: Once | ORAL | Status: AC
  Administered 2022-05-15: 1 via ORAL
  Filled 2022-05-15: qty 1

## 2022-05-15 MED ORDER — OXYCODONE-ACETAMINOPHEN 5-325 MG PO TABS: 1.0000 | ORAL_TABLET | Freq: Four times a day (QID) | ORAL | 0 refills | Status: DC | PRN

## 2022-05-15 MED ORDER — PREDNISONE 20 MG PO TABS
60.0000 mg | ORAL_TABLET | Freq: Once | ORAL | Status: AC
  Administered 2022-05-15: 60 mg via ORAL
  Filled 2022-05-15: qty 3

## 2022-05-15 MED ORDER — PREDNISONE 50 MG PO TABS: 50.0000 mg | ORAL_TABLET | Freq: Every day | ORAL | 0 refills | Status: AC

## 2022-05-15 MED ORDER — METHOCARBAMOL 500 MG PO TABS: 500.0000 mg | ORAL_TABLET | Freq: Two times a day (BID) | ORAL | 0 refills | Status: DC

## 2022-05-15 MED ORDER — CIPROFLOXACIN HCL 500 MG PO TABS: 500.0000 mg | ORAL_TABLET | Freq: Two times a day (BID) | ORAL | 0 refills | Status: DC

## 2022-05-15 NOTE — ED Provider Notes (Addendum)
[facility]-EMERGENCY DEPT Provider Note   CSN: [id number] Arrival date & time: 05/15/22  1601     History  Chief Complaint  Patient presents with   Leg Pain    Alison Vega is a 41 y.o. female here for evaluation of right lower back pain which extends into her leg over the last month.  Worse with movement.  Is not anything like this previously.  Does do a lot of pulling, twisting and stepping up for work.  Starts at her right lower back extensions into the outer SPECT of her right hip and into her leg.  States when she stays in a certain position for too long her foot starts to tingle.  States when she is "straightens up" this resolves.  She has not noted any redness, warmth to her extremities.  No recent surgery, mobilization or malignancy.  Taking Tylenol and ibuprofen at home without relief.  No history of sciatica, disc herniation.  No recent falls or injuries.  No fever, history of IV drug use, bowel or bladder incontinence, saddle paresthesia.  No chest pain, shortness of breath, cough, dysuria, hematuria.  Denies chance of pregnancy.  HPI     Home Medications Prior to Admission medications   Medication Sig Start Date End Date Taking? Authorizing Provider  Albuterol Sulfate, sensor, (PROAIR DIGIHALER) 108 (90 Base) MCG/ACT AEPB Inhale 2 puffs into the lungs as needed (coughing, wheezing, shortness of breath). 10/01/21   Ellamae Sia, DO  ciprofloxacin (CIPRO) 500 MG tablet Take 1 tablet (500 mg total) by mouth every 12 (twelve) hours. 05/15/22  Yes Noor Vidales A, PA-C  fluticasone (FLONASE) 50 MCG/ACT nasal spray Place 1-2 sprays into both nostrils daily as needed for rhinitis. 05/30/21   Ellamae Sia, DO  Fluticasone-Salmeterol,sensor, [facility]) [phone] MCG/ACT AEPB Inhale 1 puff into the lungs in the morning and at bedtime. Rinse mouth after each use. 01/30/22   Ellamae Sia, DO  hydrOXYzine (VISTARIL) 50 MG capsule Take 1-2 capsules (50-100 mg total)  by mouth at bedtime as needed. 12/17/21   Kuneff, Renee A, DO  levocetirizine (XYZAL) 5 MG tablet TAKE 1 TABLET BY MOUTH TWICE DAILY AS NEEDED FOR ALLERGIES (HIVES). 10/01/21   Ellamae Sia, DO  methocarbamol (ROBAXIN) 500 MG tablet Take 1 tablet (500 mg total) by mouth 2 (two) times daily. 05/15/22  Yes Helon Wisinski A, PA-C  oxyCODONE-acetaminophen (PERCOCET/ROXICET) 5-325 MG tablet Take 1 tablet by mouth every 6 (six) hours as needed for severe pain. 05/15/22  Yes Tejay Hubert A, PA-C  predniSONE (DELTASONE) 50 MG tablet Take 1 tablet (50 mg total) by mouth daily for 4 days. 05/15/22 05/19/22 Yes Kwana Ringel A, PA-C  solifenacin (VESICARE) 5 MG tablet Take 1 tablet (5 mg total) by mouth daily. 04/02/22   Hermina Staggers, MD  Vitamin D, Ergocalciferol, (DRISDOL) 1.25 MG (50000 UNIT) CAPS capsule Take 1 capsule (50,000 Units total) by mouth every 7 (seven) days. 12/19/21   Kuneff, Renee A, DO      Allergies    Penicillins, Sulfa antibiotics, and Avocado    Review of Systems   Review of Systems  Constitutional: Negative.   HENT: Negative.    Respiratory: Negative.    Cardiovascular: Negative.   Gastrointestinal: Negative.   Genitourinary: Negative.   Musculoskeletal:  Positive for back pain. Negative for neck pain and neck stiffness.  Skin: Negative.   Neurological: Negative.   All other systems reviewed and are negative.   Physical Exam  Updated Vital Signs BP (!) 143/84 (BP Location: Right Arm)   Pulse 78   Temp 97.9 F (36.6 C) (Oral)   Resp 16   SpO2 100%  Physical Exam Physical Exam  Constitutional: Pt appears well-developed and well-nourished. No distress.  HENT:  Head: Normocephalic and atraumatic.  Mouth/Throat: Oropharynx is clear and moist. No oropharyngeal exudate.  Eyes: Conjunctivae are normal.  Neck: Normal range of motion. Neck supple.  Full ROM without pain  Cardiovascular: Normal rate, regular rhythm and intact distal pulses.   Pulmonary/Chest: Effort  normal and breath sounds normal. No respiratory distress. Pt has no wheezes.  Abdominal: Soft. Pt exhibits no distension. There is no tenderness, rebound or guarding. No abd bruit or pulsatile mass Musculoskeletal:  Full range of motion of the T-spine and L-spine with flexion, hyperextension, and lateral flexion. No midline tenderness or stepoffs. No tenderness to palpation of the spinous processes of the T-spine or L-spine. Mild tenderness to palpation of the paraspinous muscles of the L-spine.positive straight leg raise on right at 40'.  Producible tenderness to right piriformis, SI region.  No overlying skin changes.  Nontender midshaft, distal femur, tib-fib, foot. Lymphadenopathy:    Pt has no cervical adenopathy.  Neurological: Pt is alert. Pt has normal reflexes.  Speech is clear and goal oriented, follows commands Normal 5/5 strength in upper and lower extremities bilaterally including dorsiflexion and plantar flexion, strong and equal grip strength Sensation normal to light and sharp touch Moves extremities without ataxia, coordination intact Normal gait Normal balance Skin: Skin is warm and dry. No rash noted or lesions noted. Pt is not diaphoretic. No erythema, ecchymosis,edema or warmth.  Psychiatric: Pt has a normal mood and affect. Behavior is normal.  Nursing note and vitals reviewed.  ED Results / Procedures / Treatments   Labs (all labs ordered are listed, but only abnormal results are displayed) Labs Reviewed  URINALYSIS, ROUTINE W REFLEX MICROSCOPIC - Abnormal; Notable for the following components:      Result Value   Color, Urine STRAW (*)    APPearance HAZY (*)    Leukocytes,Ua MODERATE (*)    Bacteria, UA MANY (*)    All other components within normal limits  POC URINE PREG, ED    EKG None  Radiology DG Hip Unilat W or Wo Pelvis 2-3 Views Right  Result Date: 05/15/2022 CLINICAL DATA:  Right leg pain EXAM: DG HIP (WITH OR WITHOUT PELVIS) 2-3V RIGHT  COMPARISON:  None Available. FINDINGS: Intact right hip. Negative for fracture. Bony pelvis and hips appear symmetric. No significant arthropathy. Right proximal thigh small round radiopaque foreign body noted, measuring 9 mm nonspecific by x-ray. Nonobstructive bowel gas pattern. IMPRESSION: No acute abnormality. Right proximal thigh subcentimeter soft tissue radiopaque foreign body. Electronically Signed   By: Jerilynn Mages.  Shick M.D.   On: 05/15/2022 17:23   DG Lumbar Spine Complete  Result Date: 05/15/2022 CLINICAL DATA:  Leg pain. EXAM: LUMBAR SPINE - COMPLETE 4+ VIEW COMPARISON:  None Available. FINDINGS: There is no evidence of lumbar spine fracture. Alignment is normal. Intervertebral disc spaces are maintained. IMPRESSION: Negative. Electronically Signed   By: Ronney Asters M.D.   On: 05/15/2022 17:22    Procedures Procedures    Medications Ordered in ED Medications  HYDROcodone-acetaminophen (NORCO/VICODIN) 5-325 MG per tablet 1 tablet (1 tablet Oral Given 05/15/22 1714)  predniSONE (DELTASONE) tablet 60 mg (60 mg Oral Given 05/15/22 1714)    ED Course/ Medical Decision Making/ A&P    41 year old here for  evaluation of right lower back pain extending into her leg.  Is been going on for 1 month.  No recent trauma or injury however she does stand on her feet for duration to period of time, does heavy lifting as well as movement and standing up and down off steps.  Initially pain intermittent however over the last week now constant.  She is no bowel or bladder incontinence, saddle paresthesia.  Denies chance of pregnancy.  Her abdomen is soft, nontender.  She is neurovascularly intact.  She does have reproducible tenderness to her right SI, piriformis region.  No clinical evidence of VTE on exam.  I suspect this is most likely sciatica/overuse injury.  Has failed NSAIDs and Tylenol outpatient.  We will plan on imaging.  Of note she is requesting a urinalysis that she has been urinating more frequently.   States she has history of overactive bladder however has recurrent UTIs and would like to be checked.  Negative CVA tap bilaterally, no systemic symptoms I have low suspicion for pyelonephritis, infected stone.  Labs and imaging personally viewed and interpreted:  UA positive for UTI Preg neg Xray lumbar neg Xray hip neg, possible soft tissue radiopaque foreign object. Nothing superficial on exam. Not palpable, denies recent injury or trauma however states old trauma? Question retained object then however no obvious sign of infection at this time.  Patient reassessed. Discussed labs and imaging, NV intact. Suspect sciatica. Will treat as such. Will also treat for UTI. Multiple meds allergies with anaphylaxis. Will have her FU outpatient. Low suspicion for acute neurosurgical emergency such as cauda equina, discitis, transverse myelitis, abscess, pyelonephritis, infected kidney stone, VTE, ischemia.  The patient has been appropriately medically screened and/or stabilized in the ED. I have low suspicion for any other emergent medical condition which would require further screening, evaluation or treatment in the ED or require inpatient management.  Patient is hemodynamically stable and in no acute distress.  Patient able to ambulate in department prior to ED.  Evaluation does not show acute pathology that would require ongoing or additional emergent interventions while in the emergency department or further inpatient treatment.  I have discussed the diagnosis with the patient and answered all questions.  Pain is been managed while in the emergency department and patient has no further complaints prior to discharge.  Patient is comfortable with plan discussed in room and is stable for discharge at this time.  I have discussed strict return precautions for returning to the emergency department.  Patient was encouraged to follow-up with PCP/specialist refer to at discharge.                             Medical Decision Making Amount and/or Complexity of Data Reviewed External Data Reviewed: labs, radiology and notes. Labs: ordered. Decision-making details documented in ED Course. Radiology: ordered and independent interpretation performed. Decision-making details documented in ED Course.  Risk OTC drugs. Prescription drug management. Parenteral controlled substances. Diagnosis or treatment significantly limited by social determinants of health.           Final Clinical Impression(s) / ED Diagnoses Final diagnoses:  Acute right-sided low back pain with right-sided sciatica  Acute cystitis without hematuria    Rx / DC Orders ED Discharge Orders          Ordered    ciprofloxacin (CIPRO) 500 MG tablet  Every 12 hours        05/15/22 1842  methocarbamol (ROBAXIN) 500 MG tablet  2 times daily        05/15/22 1842    predniSONE (DELTASONE) 50 MG tablet  Daily        05/15/22 1842    oxyCODONE-acetaminophen (PERCOCET/ROXICET) 5-325 MG tablet  Every 6 hours PRN        05/15/22 1842               Cartina Brousseau A, PA-C 05/15/22 1900    Isla Pence, MD 05/15/22 2027

## 2022-05-15 NOTE — Discharge Instructions (Signed)
Take the medications as prescribed  Do not drive or operate heavy machinery while taking this medication  Return for new or worsening symptoms

## 2022-05-15 NOTE — ED Notes (Signed)
Pt in restroom, pt aware UA needed.  

## 2022-05-15 NOTE — ED Triage Notes (Signed)
Pt c/o right leg pain, pt states the pain shoots down her leg and is worse after she leaves work. Pt states her job requires her to be on her feet. Pt states the pain has been off and on for a month.

## 2022-05-20 ENCOUNTER — Encounter (HOSPITAL_COMMUNITY): Payer: Self-pay

## 2022-05-20 ENCOUNTER — Emergency Department (HOSPITAL_COMMUNITY)
Admission: EM | Admit: 2022-05-20 | Discharge: 2022-05-20 | Disposition: A | Discharge status: Home / Self Care | Payer: BC Managed Care – PPO | Attending: Emergency Medicine | Admitting: Emergency Medicine

## 2022-05-20 ENCOUNTER — Other Ambulatory Visit: Payer: Self-pay

## 2022-05-20 DIAGNOSIS — J45909 Unspecified asthma, uncomplicated: Secondary | ICD-10-CM | POA: Insufficient documentation

## 2022-05-20 DIAGNOSIS — Z7951 Long term (current) use of inhaled steroids: Secondary | ICD-10-CM | POA: Insufficient documentation

## 2022-05-20 DIAGNOSIS — M5441 Lumbago with sciatica, right side: Secondary | ICD-10-CM | POA: Insufficient documentation

## 2022-05-20 DIAGNOSIS — M545 Low back pain, unspecified: Secondary | ICD-10-CM | POA: Diagnosis not present

## 2022-05-20 MED ORDER — NAPROXEN 500 MG PO TABS: 500.0000 mg | ORAL_TABLET | Freq: Two times a day (BID) | ORAL | 0 refills | Status: DC

## 2022-05-20 NOTE — ED Triage Notes (Signed)
Patient said she was seen last week for the same right leg pain. She injured herself at work and said she thinks something is in her knee, possibly metal. She cannot get into her doctor until Friday. Unable to work because of the pain. Patient ambulatory in triage with a slight limp.

## 2022-05-20 NOTE — ED Provider Notes (Signed)
[facility]-EMERGENCY DEPT Provider Note   CSN: [id number] Arrival date & time: 05/20/22  1935     History  Chief Complaint  Patient presents with   Leg Pain    Alison Vega is a 41 y.o. female.  Patient was seen on July 13.  Patient was treated for urinary tract infection at that time still taking the antibiotics.  Patient also was diagnosed with right-sided back pain and leg pain felt probably to be secondary to sciatica.  Patient was treated with oxycodone muscle relaxer and prednisone.  Patient has completed the course of the prednisone.  Patient states still has pain pain starts at the right low back goes down the leg all the way to the ankle area.  Sometimes to the top of the foot as well.  No trouble with the left side.  No incontinence.  Past medical history significant for asthma.  Patient denies any significant weakness to the right foot.  States she has some intermittent numbness to the right foot.  With the visit on July 13 patient had x-rays of the hip and the lumbar spine area.  Lumbar spine was negative.  The x-ray of the hip did show a very small foreign body in the anterior part of the thigh.  Should not be related to the patient's symptoms.       Home Medications Prior to Admission medications   Medication Sig Start Date End Date Taking? Authorizing Provider  naproxen (NAPROSYN) 500 MG tablet Take 1 tablet (500 mg total) by mouth 2 (two) times daily. 05/20/22  Yes Vanetta Mulders, MD  Albuterol Sulfate, sensor, (PROAIR DIGIHALER) 108 (90 Base) MCG/ACT AEPB Inhale 2 puffs into the lungs as needed (coughing, wheezing, shortness of breath). 10/01/21   Ellamae Sia, DO  ciprofloxacin (CIPRO) 500 MG tablet Take 1 tablet (500 mg total) by mouth every 12 (twelve) hours. 05/15/22   Henderly, Britni A, PA-C  fluticasone (FLONASE) 50 MCG/ACT nasal spray Place 1-2 sprays into both nostrils daily as needed for rhinitis. 05/30/21   Ellamae Sia, DO   Fluticasone-Salmeterol,sensor, [facility]) [phone] MCG/ACT AEPB Inhale 1 puff into the lungs in the morning and at bedtime. Rinse mouth after each use. 01/30/22   Ellamae Sia, DO  hydrOXYzine (VISTARIL) 50 MG capsule Take 1-2 capsules (50-100 mg total) by mouth at bedtime as needed. 12/17/21   Kuneff, Renee A, DO  levocetirizine (XYZAL) 5 MG tablet TAKE 1 TABLET BY MOUTH TWICE DAILY AS NEEDED FOR ALLERGIES (HIVES). 10/01/21   Ellamae Sia, DO  methocarbamol (ROBAXIN) 500 MG tablet Take 1 tablet (500 mg total) by mouth 2 (two) times daily. 05/15/22   Henderly, Britni A, PA-C  oxyCODONE-acetaminophen (PERCOCET/ROXICET) 5-325 MG tablet Take 1 tablet by mouth every 6 (six) hours as needed for severe pain. 05/15/22   Henderly, Britni A, PA-C  solifenacin (VESICARE) 5 MG tablet Take 1 tablet (5 mg total) by mouth daily. 04/02/22   Hermina Staggers, MD  Vitamin D, Ergocalciferol, (DRISDOL) 1.25 MG (50000 UNIT) CAPS capsule Take 1 capsule (50,000 Units total) by mouth every 7 (seven) days. 12/19/21   Kuneff, Renee A, DO      Allergies    Penicillins, Sulfa antibiotics, and Avocado    Review of Systems   Review of Systems  Constitutional:  Negative for chills and fever.  HENT:  Negative for ear pain and sore throat.   Eyes:  Negative for pain and visual disturbance.  Respiratory:  Negative for  cough and shortness of breath.   Cardiovascular:  Negative for chest pain and palpitations.  Gastrointestinal:  Negative for abdominal pain and vomiting.  Genitourinary:  Negative for dysuria and hematuria.  Musculoskeletal:  Positive for back pain. Negative for arthralgias.  Skin:  Negative for color change and rash.  Neurological:  Positive for weakness and numbness. Negative for seizures and syncope.  All other systems reviewed and are negative.   Physical Exam Updated Vital Signs BP (!) 146/91   Pulse 88   Temp 98.3 F (36.8 C) (Oral)   Resp 18   Ht 1.575 m (5\' 2" )   Wt 67.1 kg   SpO2 99%   BMI  27.07 kg/m  Physical Exam Vitals and nursing note reviewed.  Constitutional:      General: She is not in acute distress.    Appearance: Normal appearance. She is well-developed.  HENT:     Head: Normocephalic and atraumatic.  Eyes:     Extraocular Movements: Extraocular movements intact.     Conjunctiva/sclera: Conjunctivae normal.     Pupils: Pupils are equal, round, and reactive to light.  Cardiovascular:     Rate and Rhythm: Normal rate and regular rhythm.     Heart sounds: No murmur heard. Pulmonary:     Effort: Pulmonary effort is normal. No respiratory distress.     Breath sounds: Normal breath sounds.  Abdominal:     Palpations: Abdomen is soft.     Tenderness: There is no abdominal tenderness.  Musculoskeletal:        General: No swelling or deformity.     Cervical back: Normal range of motion and neck supple.  Skin:    General: Skin is warm and dry.     Capillary Refill: Capillary refill takes less than 2 seconds.  Neurological:     General: No focal deficit present.     Mental Status: She is alert and oriented to person, place, and time.     Cranial Nerves: No cranial nerve deficit.     Sensory: No sensory deficit.     Motor: No weakness.  Psychiatric:        Mood and Affect: Mood normal.     ED Results / Procedures / Treatments   Labs (all labs ordered are listed, but only abnormal results are displayed) Labs Reviewed - No data to display  EKG None  Radiology No results found.  Procedures Procedures    Medications Ordered in ED Medications - No data to display  ED Course/ Medical Decision Making/ A&P                           Medical Decision Making Risk Prescription drug management.   Patient symptoms seem to be consistent with left-sided sciatica.  No evidence of any cauda equina syndrome.  Patient did already have CT x-rays of the lumbar spine without any acute abnormalities.  This point patient needs follow-up will refer to sports  medicine since she is followed by primary care out in [facility] area.  We will have her follow-up at [facility].  We will go ahead and add on Naprosyn.  She already completed the course of prednisone.  If symptoms go on and persist it will be time to get MRI.  New work note provided.   Final Clinical Impression(s) / ED Diagnoses Final diagnoses:  Acute right-sided low back pain with right-sided sciatica    Rx / DC Orders  ED Discharge Orders          Ordered    naproxen (NAPROSYN) 500 MG tablet  2 times daily        05/20/22 2337              Vanetta Mulders, MD 05/20/22 [phone]

## 2022-05-20 NOTE — Discharge Instructions (Addendum)
Symptoms consistent with right-sided sciatica irritation of the nerve that comes out of your back and goes to your legs.  Work note provided to be out of work until Monday.  Make an appointment follow-up with sports medicine information provided above.  Start taking Naprosyn as directed.  Follow-up with your primary care doctor as needed.

## 2022-05-21 ENCOUNTER — Encounter: Payer: Self-pay | Admitting: Family Medicine

## 2022-05-21 ENCOUNTER — Ambulatory Visit (INDEPENDENT_AMBULATORY_CARE_PROVIDER_SITE_OTHER): Payer: BC Managed Care – PPO | Admitting: Family Medicine

## 2022-05-21 VITALS — BP 120/82 | Ht 62.0 in | Wt 148.0 lb

## 2022-05-21 DIAGNOSIS — G5701 Lesion of sciatic nerve, right lower limb: Secondary | ICD-10-CM | POA: Insufficient documentation

## 2022-05-21 DIAGNOSIS — M5416 Radiculopathy, lumbar region: Secondary | ICD-10-CM

## 2022-05-21 MED ORDER — KETOROLAC TROMETHAMINE 30 MG/ML IJ SOLN
30.0000 mg | Freq: Once | INTRAMUSCULAR | Status: AC
  Administered 2022-05-21: 30 mg via INTRAMUSCULAR

## 2022-05-21 MED ORDER — METHYLPREDNISOLONE ACETATE 40 MG/ML IJ SUSP
40.0000 mg | Freq: Once | INTRAMUSCULAR | Status: AC
  Administered 2022-05-21: 40 mg via INTRAMUSCULAR

## 2022-05-21 MED ORDER — MELOXICAM 15 MG PO TABS: ORAL_TABLET | ORAL | 1 refills | Status: DC

## 2022-05-21 NOTE — Patient Instructions (Signed)
Nice to meet you Please try heat  Please try the exercises  Please try the mobic  Please send me a message in MyChart with any questions or updates.  Please see me back in 2-3 weeks.   --Dr. Jordan Likes

## 2022-05-21 NOTE — Progress Notes (Signed)
  Alison Vega - 41 y.o. female MRN [id number]  Date of birth: [birth year]  SUBJECTIVE:  Including CC & ROS.  No chief complaint on file.   Alison Vega is a 41 y.o. female that is presenting with acute right-sided leg pain.  The pain is severe in nature.  It is worse with standing and walking.  She did get improvement with the naproxen.  No history of surgery.  Review of the emergency department note from 7/18 shows she was provided naproxen. Independent review of the pelvis and right hip x-ray from 7/13 shows no acute changes. Independent review of the lumbar spine x-ray from 7/13 shows no acute changes  Review of Systems See HPI   HISTORY: Past Medical, Surgical, Social, and Family History Reviewed & Updated per EMR.   Pertinent Historical Findings include:  Past Medical History:  Diagnosis Date   Asthma    Cellulitis    cellulitis/abcess x15, "every 2 months"   Urticaria     Past Surgical History:  Procedure Laterality Date   CESAREAN SECTION     EYE SURGERY     x3   OPEN REDUCTION INTERNAL FIXATION (ORIF) METACARPAL Right 06/13/2019   Procedure: OPEN TREATMENT OF RIGHT 5TH METACARPAL FRACTURE;  Surgeon: Mack Hook, MD;  Location: [facility];  Service: Orthopedics;  Laterality: Right;   TUBAL LIGATION       PHYSICAL EXAM:  VS: BP 120/82 (BP Location: Left Arm, Patient Position: Sitting)   Ht 5\' 2"  (1.575 m)   Wt 148 lb (67.1 kg)   BMI 27.07 kg/m  Physical Exam Gen: NAD, alert, cooperative with exam, well-appearing MSK:  Neurovascularly intact       ASSESSMENT & PLAN:   Lumbar radiculopathy Acutely occurring.  Symptoms most consistent with a radicular pattern.  A nerve impingement is likely the source. -Counseled on home exercise therapy and supportive care. -IM Depo-Medrol and Toradol. -Provided meloxicam. -May consider further imaging or physical therapy.

## 2022-05-21 NOTE — Assessment & Plan Note (Signed)
Acutely occurring.  Symptoms most consistent with a radicular pattern.  A nerve impingement is likely the source. -Counseled on home exercise therapy and supportive care. -IM Depo-Medrol and Toradol. -Provided meloxicam. -May consider further imaging or physical therapy.

## 2022-05-23 ENCOUNTER — Encounter: Payer: Self-pay | Admitting: Family Medicine

## 2022-05-23 ENCOUNTER — Ambulatory Visit (INDEPENDENT_AMBULATORY_CARE_PROVIDER_SITE_OTHER): Payer: BC Managed Care – PPO | Admitting: Family Medicine

## 2022-05-23 VITALS — BP 123/79 | Temp 98.3°F | Wt 153.4 lb

## 2022-05-23 DIAGNOSIS — S80851A Superficial foreign body, right lower leg, initial encounter: Secondary | ICD-10-CM | POA: Diagnosis not present
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
## 2022-06-02 NOTE — Progress Notes (Unsigned)
Follow Up Note  RE: Alison Vega MRN: [id number] DOB: 09-May-1981 Date of Office Visit: 06/03/2022  Referring provider: Ma Hillock, DO Primary care provider: Ma Hillock, DO  Chief Complaint: Urticaria (No more hive breakouts ) and Asthma (Has been having to use the proair freq. At her job she says it is extremely hot)  History of Present Illness: I had the pleasure of seeing [name] for a follow up visit at the [facility] on 06/03/2022. She is a 41 y.o. female, who is being followed for CIU, asthma, nonallergic rhinitis. Her previous allergy office visit was on 01/30/2022 with Dr. Maudie Mercury. Today is a regular follow up visit.  Chronic idiopathic urticaria No more outbreaks but still taking Xyzal 52m daily due to sneezing.   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  Currently on Airduo 1159m 1 puff BID and doing well on it. It was $20.   She is using albuterol 2 puffs prior to going to work and then another 2 puffs in the middle of her shift. She is working about 5-7 days per week and the humidity, heat is flaring her asthma at work.  She is trying to find a different job.    Nonallergic rhinitis Currently on Xyzal daily and Flonase 1 spray per nostril with good benefit. No nosebleeds.  Assessment and Plan: [name] a [age].o. female with: Chronic idiopathic urticaria Past history - Lab evaluation unremarkable (FCeRI antibody, anti-thyroglobulin antibody, thyroid peroxidase antibody, tryptase, H. pylori serology, CBC, CMP, ESR, ANA, and alpha-gal panel). On Xolair in the past.  Interim history -  No more hives. Takes Xyzal 41m66maily for rhinitis symptoms.  If you start breaking out again let us [place]ow and start the following medications:  Xyzal 41mg88mice a day. Pepcid (famotidine) 20mg41mce a day.  Avoid the following potential triggers: alcohol, tight clothing, NSAIDs, hot showers and getting  overheated.  Moderate persistent asthma without complication Past history - normal CXR in 2021. Symbicort and Breo too expensive. Prednisone in December 2022.  Interim history - Airduo was $20. Using albuterol 1-2 times per day when at work due to heat and humidity. Today's spirometry was normal.  Daily controller medication(s): Start Trelegy 200mcg59muff once a day and rinse mouth after each. Samples given. Stop Airduo for now.  May use albuterol rescue inhaler 2-4 puffs every 4 to 6 hours as needed for shortness of breath, chest tightness, coughing, and wheezing. May use albuterol rescue inhaler 2 puffs 5 to 15 minutes prior to strenuous physical activities. Monitor frequency of use.  Get spirometry at next visit.  Nonallergic rhinitis Past history - 2020 skin testing negative to pediatric environmental panel.  Interim history - controlled with Flonase and Xyzal. Use Flonase (fluticasone) nasal spray 1 spray per nostril twice a day as needed for nasal congestion.  Nasal saline spray (i.e., Simply Saline) or nasal saline lavage (i.e., NeilMed) is recommended as needed and prior to medicated nasal sprays. May take Xyzal (levocetirizine) 41mg 1-28mimes daily as needed.   Return in about 4 months (around 10/03/2022).  Meds ordered this encounter  Medications   Fluticasone-Umeclidin-Vilant (TRELEGY ELLIPTA) 200-62.5-25 MCG/ACT AEPB    Sig: Inhale 1 puff into the lungs daily. Rinse mouth after each use.    Dispense:  60 each    Refill:  5    Please keep prescription on file until patient is ready for  pick up.   Lab Orders  No laboratory test(s) ordered today    Diagnostics: Spirometry:  Tracings reviewed. Her effort: Good reproducible efforts. FVC: 3.11L FEV1: 2.68L, 91% predicted FEV1/FVC ratio: 86% Interpretation: Spirometry consistent with normal pattern.  Please see scanned spirometry results for details.  Medication List:  Current Outpatient Medications  Medication Sig  Dispense Refill   Albuterol Sulfate, sensor, (PROAIR DIGIHALER) 108 (90 Base) MCG/ACT AEPB Inhale 2 puffs into the lungs as needed (coughing, wheezing, shortness of breath). 1 each 2   fluticasone (FLONASE) 50 MCG/ACT nasal spray Place 1-2 sprays into both nostrils daily as needed for rhinitis. 16 g 5   Fluticasone-Umeclidin-Vilant (TRELEGY ELLIPTA) 200-62.5-25 MCG/ACT AEPB Inhale 1 puff into the lungs daily. Rinse mouth after each use. 60 each 5   hydrOXYzine (VISTARIL) 50 MG capsule Take 1-2 capsules (50-100 mg total) by mouth at bedtime as needed. 180 capsule 3   levocetirizine (XYZAL) 5 MG tablet TAKE 1 TABLET BY MOUTH TWICE DAILY AS NEEDED FOR ALLERGIES (HIVES). 180 tablet 2   meloxicam (MOBIC) 15 MG tablet One tab PO qAM with breakfast for 2 weeks, then daily prn pain. 45 tablet 1   solifenacin (VESICARE) 5 MG tablet Take 1 tablet (5 mg total) by mouth daily. 30 tablet 5   Vitamin D, Ergocalciferol, (DRISDOL) 1.25 MG (50000 UNIT) CAPS capsule Take 1 capsule (50,000 Units total) by mouth every 7 (seven) days. 12 capsule 4   No current facility-administered medications for this visit.   Allergies: Allergies  Allergen Reactions   Penicillins Shortness Of Breath and Other (See Comments)    Shut patients throat.   Sulfa Antibiotics Shortness Of Breath and Other (See Comments)    Shuts patient's throat.   Avocado Hives   I reviewed her past medical history, social history, family history, and environmental history and no significant changes have been reported from her previous visit.  Review of Systems  Constitutional:  Negative for appetite change, chills, fever and unexpected weight change.  HENT:  Negative for congestion and rhinorrhea.   Eyes:  Negative for itching.  Respiratory:  Positive for shortness of breath. Negative for cough, chest tightness and wheezing.   Gastrointestinal:  Negative for abdominal pain.  Skin:  Negative for rash.  Allergic/Immunologic: Negative for  environmental allergies and food allergies.  Neurological:  Negative for headaches.   Objective: BP 126/80   Pulse 66   Temp 97.9 F (36.6 C)   Resp 20   Ht '5\' 1"'  (1.549 m)   Wt 151 lb 12 oz (68.8 kg)   SpO2 98%   BMI 28.67 kg/m  Body mass index is 28.67 kg/m. Physical Exam Vitals and nursing note reviewed.  Constitutional:      Appearance: Normal appearance. She is well-developed.  HENT:     Head: Normocephalic and atraumatic.     Right Ear: Tympanic membrane and external ear normal.     Left Ear: Tympanic membrane and external ear normal.     Nose: Nose normal.     Mouth/Throat:     Mouth: Mucous membranes are moist.     Pharynx: Oropharynx is clear.  Eyes:     Conjunctiva/sclera: Conjunctivae normal.  Cardiovascular:     Rate and Rhythm: Normal rate and regular rhythm.     Heart sounds: Normal heart sounds. No murmur heard.    No friction rub. No gallop.  Pulmonary:     Effort: Pulmonary effort is normal.     Breath sounds: Normal breath  sounds. No wheezing or rales.  Musculoskeletal:     Cervical back: Neck supple.  Skin:    General: Skin is warm.     Findings: No rash.  Neurological:     Mental Status: She is alert and oriented to person, place, and time.  Psychiatric:        Behavior: Behavior normal.   Previous notes and tests were reviewed. The plan was reviewed with the patient/family, and all questions/concerned were addressed.  It was my pleasure to see [facility] today and participate in her care. Please feel free to contact me with any questions or concerns.  Sincerely,  Rexene Alberts, DO Allergy & Immunology  [facility] office: [place] office: [phone]

## 2022-06-03 ENCOUNTER — Ambulatory Visit (INDEPENDENT_AMBULATORY_CARE_PROVIDER_SITE_OTHER): Payer: BC Managed Care – PPO | Admitting: Allergy

## 2022-06-03 ENCOUNTER — Encounter: Payer: Self-pay | Admitting: Allergy

## 2022-06-03 VITALS — BP 126/80 | HR 66 | Temp 97.9°F | Resp 20 | Ht 61.0 in | Wt 151.8 lb

## 2022-06-03 DIAGNOSIS — J310 Chronic rhinitis: Secondary | ICD-10-CM

## 2022-06-03 DIAGNOSIS — J4540 Moderate persistent asthma, uncomplicated: Secondary | ICD-10-CM

## 2022-06-03 DIAGNOSIS — L501 Idiopathic urticaria: Secondary | ICD-10-CM

## 2022-06-03 MED ORDER — TRELEGY ELLIPTA 200-62.5-25 MCG/ACT IN AEPB: 1.0000 | INHALATION_SPRAY | Freq: Every day | RESPIRATORY_TRACT | 5 refills | Status: DC

## 2022-06-03 NOTE — Assessment & Plan Note (Signed)
Past history - normal CXR in 2021. Symbicort and Breo too expensive. Prednisone in December 2022.  Interim history - Airduo was $20. Using albuterol 1-2 times per day when at work due to heat and humidity. . Today's spirometry was normal.  . Daily controller medication(s): Start Trelegy 1 puff once a day and rinse mouth after each. Samples given.  Stop Airduo for now.  . May use albuterol rescue inhaler 2-4 puffs every 4 to 6 hours as needed for shortness of breath, chest tightness, coughing, and wheezing. May use albuterol rescue inhaler 2 puffs 5 to 15 minutes prior to strenuous physical activities. Monitor frequency of use.  . Get spirometry at next visit.

## 2022-06-03 NOTE — Assessment & Plan Note (Signed)
Past history - Lab evaluation unremarkable (FCeRI antibody, anti-thyroglobulin antibody, thyroid peroxidase antibody, tryptase, H. pylori serology, CBC, CMP, ESR, ANA, and alpha-gal panel). On Xolair in the past.  Interim history -  No more hives. Takes Xyzal 60m daily for rhinitis symptoms.   If you start breaking out again let [place] and start the following medications:   Xyzal 579mtwice a day.  Pepcid (famotidine) 20109mwice a day.  . Avoid the following potential triggers: alcohol, tight clothing, NSAIDs, hot showers and getting overheated.

## 2022-06-03 NOTE — Patient Instructions (Addendum)
Asthma  Daily controller medication(s): Start Trelegy 1 puff once a day and rinse mouth after each. Samples given.  Stop Airduo for now.  May use albuterol rescue inhaler 2-4 puffs every 4 to 6 hours as needed for shortness of breath, chest tightness, coughing, and wheezing. May use albuterol rescue inhaler 2 puffs 5 to 15 minutes prior to strenuous physical activities. Monitor frequency of use.  Asthma control goals:  Full participation in all desired activities (may need albuterol before activity) Albuterol use two times or less a week on average (not counting use with activity) Cough interfering with sleep two times or less a month Oral steroids no more than once a year No hospitalizations  Urticaria If you start breaking out again let us know and start the following medications:  Xyzal 5mg  twice a day. Pepcid (famotidine) 20mg  twice a day.  Avoid the following potential triggers: alcohol, tight clothing, NSAIDs, hot showers and getting overheated.  Chronic rhinitis Use Flonase (fluticasone) nasal spray 1 spray per nostril twice a day as needed for nasal congestion.  Nasal saline spray (i.e., Simply Saline) or nasal saline lavage (i.e., NeilMed) is recommended as needed and prior to medicated nasal sprays. May take Xyzal (levocetirizine) 5mg  1-2 times daily as needed.    Follow up in 4 months or sooner if needed.

## 2022-06-03 NOTE — Assessment & Plan Note (Signed)
Past history - 2020 skin testing negative to pediatric environmental panel.  Interim history - controlled with Flonase and Xyzal.  Use Flonase (fluticasone) nasal spray 1 spray per nostril twice a day as needed for nasal congestion.   Nasal saline spray (i.e., Simply Saline) or nasal saline lavage (i.e., NeilMed) is recommended as needed and prior to medicated nasal sprays.  May take Xyzal (levocetirizine) 5mg  1-2 times daily as needed.

## 2022-06-11 ENCOUNTER — Encounter: Payer: Self-pay | Admitting: Family Medicine

## 2022-06-11 ENCOUNTER — Ambulatory Visit (INDEPENDENT_AMBULATORY_CARE_PROVIDER_SITE_OTHER): Payer: BC Managed Care – PPO | Admitting: Family Medicine

## 2022-06-11 VITALS — BP 137/85 | Ht 61.0 in | Wt 151.0 lb

## 2022-06-11 DIAGNOSIS — M5416 Radiculopathy, lumbar region: Secondary | ICD-10-CM | POA: Diagnosis not present

## 2022-06-11 MED ORDER — GABAPENTIN 300 MG PO CAPS: 300.0000 mg | ORAL_CAPSULE | Freq: Three times a day (TID) | ORAL | 1 refills | Status: DC

## 2022-06-11 NOTE — Progress Notes (Signed)
  Alison Vega - 41 y.o. female MRN [id number]  Date of birth: 1981-02-28  SUBJECTIVE:  Including CC & ROS.  No chief complaint on file.   Alison Vega is a 41 y.o. female that is following up for her right-sided leg pain and low back pain.  She had a spasm for a few days in the middle and lower part of her back.  Continues to have the pain on a regular basis with walking and standing.  Pain seems exacerbated with her activities at work.  Did get a few days of improvement with the previous injections.   Review of Systems See HPI   HISTORY: Past Medical, Surgical, Social, and Family History Reviewed & Updated per EMR.   Pertinent Historical Findings include:  Past Medical History:  Diagnosis Date   Asthma    Cellulitis    cellulitis/abcess x15, "every 2 months"   Urticaria     Past Surgical History:  Procedure Laterality Date   CESAREAN SECTION     EYE SURGERY     x3   OPEN REDUCTION INTERNAL FIXATION (ORIF) METACARPAL Right 06/13/2019   Procedure: OPEN TREATMENT OF RIGHT 5TH METACARPAL FRACTURE;  Surgeon: Mack Hook, MD;  Location: [facility];  Service: Orthopedics;  Laterality: Right;   TUBAL LIGATION       PHYSICAL EXAM:  VS: BP 137/85 (BP Location: Left Arm, Patient Position: Sitting)   Ht 5\' 1"  (1.549 m)   Wt 151 lb (68.5 kg)   BMI 28.53 kg/m  Physical Exam Gen: NAD, alert, cooperative with exam, well-appearing MSK:  Neurovascularly intact       ASSESSMENT & PLAN:   Lumbar radiculopathy Acutely occurring.  Symptoms still most consistent with a nerve impingement at this time.  May have an impingement of the right hip that is contributing. -Counseled on home exercise therapy and supportive care. -Gabapentin. -Referral to physical therapy. -Provided work note. - Could consider further imaging.

## 2022-06-11 NOTE — Patient Instructions (Signed)
Good to see you Please continue the exercises  Please start with gabapentin at night and then you can increase to 2 or 3 times daily as you tolerate  I have made a referral to physical therapy  Please send me a message in MyChart with any questions or updates.  Please see me back in 3-4 weeks.   --Dr. Jordan Likes

## 2022-06-11 NOTE — Assessment & Plan Note (Signed)
Acutely occurring.  Symptoms still most consistent with a nerve impingement at this time.  May have an impingement of the right hip that is contributing. -Counseled on home exercise therapy and supportive care. -Gabapentin. -Referral to physical therapy. -Provided work note. - Could consider further imaging.

## 2022-06-19 ENCOUNTER — Encounter: Payer: Self-pay | Admitting: Physical Therapy

## 2022-06-19 ENCOUNTER — Other Ambulatory Visit: Payer: Self-pay

## 2022-06-19 ENCOUNTER — Ambulatory Visit: Payer: BC Managed Care – PPO | Attending: Family Medicine | Admitting: Physical Therapy

## 2022-06-19 DIAGNOSIS — M6281 Muscle weakness (generalized): Secondary | ICD-10-CM | POA: Diagnosis not present

## 2022-06-19 DIAGNOSIS — M5441 Lumbago with sciatica, right side: Secondary | ICD-10-CM | POA: Insufficient documentation

## 2022-06-19 DIAGNOSIS — R2689 Other abnormalities of gait and mobility: Secondary | ICD-10-CM | POA: Insufficient documentation

## 2022-06-19 DIAGNOSIS — M5416 Radiculopathy, lumbar region: Secondary | ICD-10-CM | POA: Diagnosis not present

## 2022-06-19 NOTE — Therapy (Signed)
OUTPATIENT PHYSICAL THERAPY THORACOLUMBAR EVALUATION   Patient Name: Alison Vega MRN: [id number] DOB:1981-06-24, 41 y.o., female Today's Date: 06/19/2022   PT End of Session - 06/19/22 1247     Visit Number 1    Number of Visits --   1-2x/week   Date for PT Re-Evaluation 08/14/22    Authorization Type [employer] - FOTO    PT Start Time 1130    PT Stop Time 1208    PT Time Calculation (min) 38 min             Past Medical History:  Diagnosis Date   Asthma    Cellulitis    cellulitis/abcess x15, "every 2 months"   Urticaria    Past Surgical History:  Procedure Laterality Date   CESAREAN SECTION     EYE SURGERY     x3   OPEN REDUCTION INTERNAL FIXATION (ORIF) METACARPAL Right 06/13/2019   Procedure: OPEN TREATMENT OF RIGHT 5TH METACARPAL FRACTURE;  Surgeon: Mack Hook, MD;  Location: [facility];  Service: Orthopedics;  Laterality: Right;   TUBAL LIGATION     Patient Active Problem List   Diagnosis Date Noted   Lumbar radiculopathy 05/21/2022   Visit for routine gyn exam 01/24/2022   Vitamin D deficiency 12/17/2021   Recurrent major depression in remission (HCC) 12/17/2021   Stress incontinence (female) (female) 12/17/2021   History of abnormal cervical Pap smear 12/17/2021   Primary insomnia 11/01/2021   Asthmatic bronchitis 11/01/2021   Moderate persistent asthma without complication 10/01/2021   Chronic idiopathic urticaria 05/30/2021   Nonallergic rhinitis 09/13/2019    PCP: Natalia Leatherwood, DO  REFERRING PROVIDER: Myra Rude, MD  THERAPY DIAG:  Low back pain with right-sided sciatica, unspecified back pain laterality, unspecified chronicity - Plan: PT plan of care cert/re-cert  Muscle weakness - Plan: PT plan of care cert/re-cert  Other abnormalities of gait and mobility - Plan: PT plan of care cert/re-cert  REFERRING DIAG: Lumbar radiculopathy [M54.16]  Rationale for Evaluation and Treatment  Rehabilitation  SUBJECTIVE:  PERTINENT PAST HISTORY:  Stress incontinence         PRECAUTIONS: None  WEIGHT BEARING RESTRICTIONS No  FALLS:  Has patient fallen in last 6 months? Yes, Number of falls: 1 "not sure what happened, I just went down  MOI/History of condition:  Onset date: May 19 2022  Alison Vega is a 41 y.o. female who presents to clinic with chief complaint of R sided back and LE pain.  She reports no acute injury, but sudden onset of pain "it was just there one day..  Pt states that she has historically had muscle spasms in her low back.  She reports some numbness in her R leg.  This pain interferes with her work quite bit.  She is currently out of work.  From referring provider:   "Alison Vega is a 41 y.o. female that is following up for her right-sided leg pain and low back pain.  She had a spasm for a few days in the middle and lower part of her back.  Continues to have the pain on a regular basis with walking and standing.  Pain seems exacerbated with her activities at work.  Did get a few days of improvement with the previous injections.  "   Red flags:  denies bil numbness and tingling, BB changes, saddle anesthesia, and ataxia  Pain:  Are you having pain? Yes Pain location: anterior thigh, glute, shooting pain to foot NPRS scale:  current 7/10  average 9/10  Aggravating factors: walking, driving for long periods  NPRS, highest: 10/10 Relieving factors: rest, "stay off of it"  NPRS: best: 7/10 Pain description: intermittent, constant, burning, and aching Stage: Subacute Stability: staying the same 24 hour pattern: no clear pattern   Occupation: works at [employer] Device: na  Hand Dominance: na  Patient Goals/Specific Activities: reduce pain   OBJECTIVE:   DIAGNOSTIC FINDINGS:  X-ray of hip and lumbar spine (-)   GENERAL OBSERVATION/GAIT:  Highly antalgic gait with reduce time in stance on  R  SENSATION:  Light touch: Deficits R LE L4->L5  MUSCLE LENGTH: Hamstrings: Right subtle, w/ pain restriction; Left no restriction ASLR: Right ASLR significantly reduced compared to PSLR ; Left ASLR = PSLR Thomas test: Right significant, w/ pain restriction; Left no restriction  LUMBAR AROM  AROM AROM  06/19/2022  Flexion limited by 50%, w/ concordant pain  Extension limited by 50%  Right lateral flexion limited by 50%, w/ concordant pain  Left lateral flexion WNL  Right rotation limited by 50%, w/ concordant pain  Left rotation limited by 25%    (Blank rows = not tested)  DIRECTIONAL PREFERENCE:  Lumbar ext better than flexion  LE MMT:  MMT Right 06/19/2022 Left 06/19/2022  Hip flexion (L2, L3) 3+ 4  Knee extension (L3) 3+ 4  Knee flexion 3+ 4+  Hip abduction Unable d/t pain Unable d/t pin  Hip extension    Hip external rotation    Hip internal rotation    Hip adduction    Ankle dorsiflexion (L4) 3- N  Ankle plantarflexion (S1) 3 N  Ankle inversion    Ankle eversion    Great Toe ext (L5) D N  Grossly     (Blank rows = not tested, score listed is out of 5 possible points.  N = WNL, D = diminished, C = clear for gross weakness with myotome testing, * = concordant pain with testing)   LE ROM:  ROM Right 06/19/2022 Left 06/19/2022  Hip flexion WNL WNL  Hip extension    Hip abduction    Hip adduction    Hip internal rotation    Hip external rotation    Knee flexion    Knee extension    Ankle dorsiflexion    Ankle plantarflexion    Ankle inversion    Ankle eversion      (Blank rows = not tested, N = WNL, * = concordant pain with testing)  Functional Tests  Eval (06/19/2022)                                                               LUMBAR SPECIAL TESTS:  Straight leg raise: L (-), R (+) Slump: L (-), R (-)   SPINAL SEGMENTAL MOBILITY ASSESSMENT:  PA lower lumbar spine reproduces sxs and improves pai  PATIENT SURVEYS:  FOTO 45 ->  67   TODAY'S TREATMENT  Creating, reviewing, and completing below HEP  PATIENT EDUCATION:  POC, diagnosis, prognosis, HEP, and outcome measures.  Pt educated via explanation, demonstration, and handout (HEP).  Pt confirms understanding verbally.   HOME EXERCISE PROGRAM: Access Code: [email or web address] URL: [email or web address] Date: 06/19/2022 Prepared by: Alison Vega  Exercises - Static Prone on Elbows  -  5 x daily - 7 x weekly - 3 sets - 10 reps  ASTERISK SIGNS   Asterisk Signs Eval (06/19/2022)       Pain with walking 10/10       R SLR (+)       R ASLR does not equal PSLR true                         ASSESSMENT:  CLINICAL IMPRESSION: Alison Vega is a 41 y.o. female who presents to clinic with signs and sxs consistent with R sided low back pain with radiculopathy.  Sensory disruption L4/L5 dermatomes with weakness in L4/L4 dermatome.  (+) SLR.  Reproduction of sxs with PA lower lumbar spine, which is somewhat relieving.  Will trial ext based program given relief with PA.  Unable to rule out hip pathology d/t high irritability, but this would be concurrent with nerve irritiation.    OBJECTIVE IMPAIRMENTS: Pain, lumbar ROM, LE and hip strength  ACTIVITY LIMITATIONS: work, ADLs, bending, lifting  PERSONAL FACTORS: See medical history and pertinent history   REHAB POTENTIAL: Good  CLINICAL DECISION MAKING: Evolving/moderate complexity  EVALUATION COMPLEXITY: Moderate   GOALS:   SHORT TERM GOALS: Target date: 07/10/2022  [facility] will be >75% HEP compliant to improve carryover between sessions and facilitate independent management of condition  Evaluation (06/19/2022): ongoing Goal status: INITIAL   LONG TERM GOALS: Target date: 08/14/2022  [facility] will improve FOTO score to 67 as a proxy for functional improvement  Evaluation/Baseline (06/19/2022): 45 Goal status: INITIAL   2.  [place] will show ASLR = PSLR to show improved neural mobility combined with LE  strength  Evaluation/Baseline (06/19/2022): limited Goal status: INITIAL   3.  [place] will improve the following MMTs to >/= 4/5 to show improvement in strength:  R LE and hip   Evaluation/Baseline (06/19/2022): see chart in note Goal status: INITIAL  LE MMT:  MMT Right 06/19/2022 Left 06/19/2022  Hip flexion (L2, L3) 3+ 4  Knee extension (L3) 3+ 4  Knee flexion 3+ 4+  Hip abduction Unable d/t pain Unable d/t pin  Hip extension    Hip external rotation    Hip internal rotation    Hip adduction    Ankle dorsiflexion (L4) 3- N  Ankle plantarflexion (S1) 3 N  Ankle inversion    Ankle eversion    Great Toe ext (L5)    Grossly      4.  Darleny will self report >/= 50% decrease in pain from evaluation   Evaluation/Baseline (06/19/2022): 10/10 max pain Goal status: INITIAL    PLAN: PT FREQUENCY: 1-2x/week  PT DURATION: 8 weeks (Ending 08/14/2022)  PLANNED INTERVENTIONS: Therapeutic exercises, Aquatic therapy, Therapeutic activity, Neuro Muscular re-education, Gait training, Patient/Family education, Joint mobilization, Dry Needling, Electrical stimulation, Spinal mobilization and/or manipulation, Moist heat, Taping, Vasopneumatic device, Ionotophoresis 4mg /ml Dexamethasone, and Manual therapy  PLAN FOR NEXT SESSION: trial ext based program, TDN, progressive hip and core strengthening, neural mobility if tolerated   Shearon Balo PT, DPT 06/19/2022, 12:50 PM

## 2022-06-23 ENCOUNTER — Telehealth: Payer: Self-pay | Admitting: *Deleted

## 2022-06-23 NOTE — Telephone Encounter (Signed)
Per Dr. Jordan Likes- patient's nerve is irritated and this is most likely causing her pain. Plan: she can hold PT until her 07/11/22 OV. Dr. Jordan Likes will order MRI then.

## 2022-06-23 NOTE — Telephone Encounter (Signed)
-----   Message from Carl Best sent at 06/23/2022  2:44 PM EDT ----- Regarding: pt's physical therapist suggest she hv MRI  Pt called to confirm STD forms completed & sent back to [employer], advise they were 06/18/22---  Patient also states when @ PT last week therapist pressed down on lower part of back & she felt a very sharpe pain shoot down her Rt leg since then has experienced some weakness on that side(she wonders if that is normal) says PT then suggest she have an MRI to determine cause.  Msg to med asst for review w/provider.  THX glh

## 2022-06-23 NOTE — Telephone Encounter (Signed)
Left message for patient to call back  

## 2022-06-24 ENCOUNTER — Encounter: Payer: Self-pay | Admitting: Physical Therapy

## 2022-06-24 ENCOUNTER — Ambulatory Visit: Payer: BC Managed Care – PPO | Admitting: Physical Therapy

## 2022-06-24 DIAGNOSIS — R2689 Other abnormalities of gait and mobility: Secondary | ICD-10-CM

## 2022-06-24 DIAGNOSIS — M5416 Radiculopathy, lumbar region: Secondary | ICD-10-CM | POA: Diagnosis not present

## 2022-06-24 DIAGNOSIS — M5441 Lumbago with sciatica, right side: Secondary | ICD-10-CM | POA: Diagnosis not present

## 2022-06-24 DIAGNOSIS — M6281 Muscle weakness (generalized): Secondary | ICD-10-CM | POA: Diagnosis not present

## 2022-06-24 NOTE — Therapy (Signed)
OUTPATIENT PHYSICAL THERAPY TREATMENT NOTE   Patient Name: Alison Vega MRN: [id number] DOB:[birth year], 41 y.o., female Today's Date: 06/24/2022  PCP: Natalia Leatherwood, DO   REFERRING PROVIDER: Myra Rude, MD   PT End of Session - 06/24/22 1129     Visit Number 2    Number of Visits --   1-2x/week   Date for PT Re-Evaluation 08/14/22    Authorization Type [employer]BS - FOTO    PT Start Time 1130    PT Stop Time 1211    PT Time Calculation (min) 41 min             Past Medical History:  Diagnosis Date   Asthma    Cellulitis    cellulitis/abcess x15, "every 2 months"   Urticaria    Past Surgical History:  Procedure Laterality Date   CESAREAN SECTION     EYE SURGERY     x3   OPEN REDUCTION INTERNAL FIXATION (ORIF) METACARPAL Right 06/13/2019   Procedure: OPEN TREATMENT OF RIGHT 5TH METACARPAL FRACTURE;  Surgeon: Mack Hook, MD;  Location: [facility];  Service: Orthopedics;  Laterality: Right;   TUBAL LIGATION     Patient Active Problem List   Diagnosis Date Noted   Lumbar radiculopathy 05/21/2022   Visit for routine gyn exam 01/24/2022   Vitamin D deficiency 12/17/2021   Recurrent major depression in remission (HCC) 12/17/2021   Stress incontinence (female) (female) 12/17/2021   History of abnormal cervical Pap smear 12/17/2021   Primary insomnia 11/01/2021   Asthmatic bronchitis 11/01/2021   Moderate persistent asthma without complication 10/01/2021   Chronic idiopathic urticaria 05/30/2021   Nonallergic rhinitis 09/13/2019    THERAPY DIAG:  Low back pain with right-sided sciatica, unspecified back pain laterality, unspecified chronicity  Muscle weakness  Other abnormalities of gait and mobility  REFERRING DIAG: Lumbar radiculopathy [M54.16]  PERTINENT HISTORY: Stress incontinence    PRECAUTIONS/RESTRICTIONS:   None  SUBJECTIVE:  Pt reports that her pain is a "15" today.  She must complete therapy before her MRI will be  authorized.  Pain:  Are you having pain? Yes Pain location: anterior thigh, glute, shooting pain to foot NPRS scale:  current 10/10  average 9/10  Aggravating factors: walking, driving for long periods Relieving factors: rest, "stay off of it" Pain description: intermittent, constant, burning, and aching Stage: Subacute 24 hour pattern: no clear pattern   OBJECTIVE:  DIAGNOSTIC FINDINGS:  X-ray of hip and lumbar spine (-)    GENERAL OBSERVATION/GAIT:           Highly antalgic gait with reduce time in stance on R   SENSATION:          Light touch: Deficits R LE L4->L5   MUSCLE LENGTH: Hamstrings: Right subtle, w/ pain restriction; Left no restriction ASLR: Right ASLR significantly reduced compared to PSLR ; Left ASLR = PSLR Thomas test: Right significant, w/ pain restriction; Left no restriction   LUMBAR AROM   AROM AROM  06/19/2022  Flexion limited by 50%, w/ concordant pain  Extension limited by 50%  Right lateral flexion limited by 50%, w/ concordant pain  Left lateral flexion WNL  Right rotation limited by 50%, w/ concordant pain  Left rotation limited by 25%    (Blank rows = not tested)   DIRECTIONAL PREFERENCE:           Lumbar ext better than flexion   LE MMT:   MMT Right 06/19/2022 Left 06/19/2022  Hip flexion (L2, L3)  3+ 4  Knee extension (L3) 3+ 4  Knee flexion 3+ 4+  Hip abduction Unable d/t pain Unable d/t pin  Hip extension      Hip external rotation      Hip internal rotation      Hip adduction      Ankle dorsiflexion (L4) 3- N  Ankle plantarflexion (S1) 3 N  Ankle inversion      Ankle eversion      Great Toe ext (L5) D N  Grossly        (Blank rows = not tested, score listed is out of 5 possible points.  N = WNL, D = diminished, C = clear for gross weakness with myotome testing, * = concordant pain with testing)     LE ROM:   ROM Right 06/19/2022 Left 06/19/2022  Hip flexion WNL WNL  Hip extension      Hip abduction      Hip  adduction      Hip internal rotation      Hip external rotation      Knee flexion      Knee extension      Ankle dorsiflexion      Ankle plantarflexion      Ankle inversion      Ankle eversion         (Blank rows = not tested, N = WNL, * = concordant pain with testing)   Functional Tests   Eval (06/19/2022)                                                                                                             LUMBAR SPECIAL TESTS:  Straight leg raise: L (-), R (+) Slump: L (-), R (-)    SPINAL SEGMENTAL MOBILITY ASSESSMENT:  PA lower lumbar spine reproduces sxs and improves pai   PATIENT SURVEYS:  FOTO 45 -> 67     TODAY'S TREATMENT  Creating, reviewing, and completing below HEP   PATIENT EDUCATION:  POC, diagnosis, prognosis, HEP, and outcome measures.  Pt educated via explanation, demonstration, and handout (HEP).  Pt confirms understanding verbally.    HOME EXERCISE PROGRAM: Access Code: [email or web address] URL: [email or web address] Date: 06/19/2022 Prepared by: Shearon Balo   Exercises - Static Prone on Elbows  - 5 x daily - 7 x weekly - 3 sets - 10 reps   ASTERISK SIGNS     Asterisk Signs Eval (06/19/2022)            Pain with walking 10/10            R SLR (+)            R ASLR does not equal PSLR true                                              TREATMENT 8/22:  Therapeutic Exercise: - nu-step L5 63m while taking subjective and planning  session with patient - LTR - 30x - hip adduction squeeze - 3x10 - 2'' - HS roll out swiss ball - 20x - R sciatic nerve glide - legs supported on ball - 2x20 - HL abdominal isometric squeeze - 3x10``  Manual Therapy: - CPA and UPA lower lumbar spine, pt in prone GII - GIII   ASSESSMENT:   CLINICAL IMPRESSION: Elysabeth is significantly limited in all activities d/t pain.  Manual PAs are significantly relieving, with pain reduction to 3/10.  Will monitor for durability.  We concentrated  on basic mat strengthening with no adverse reaction.    OBJECTIVE IMPAIRMENTS: Pain, lumbar ROM, LE and hip strength   ACTIVITY LIMITATIONS: work, ADLs, bending, lifting   PERSONAL FACTORS: See medical history and pertinent history     REHAB POTENTIAL: Good   CLINICAL DECISION MAKING: Evolving/moderate complexity   EVALUATION COMPLEXITY: Moderate     GOALS:     SHORT TERM GOALS: Target date: 07/10/2022   [facility] will be >75% HEP compliant to improve carryover between sessions and facilitate independent management of condition   Evaluation (06/19/2022): ongoing Goal status: INITIAL     LONG TERM GOALS: Target date: 08/14/2022   [facility] will improve FOTO score to 67 as a proxy for functional improvement   Evaluation/Baseline (06/19/2022): 45 Goal status: INITIAL     2.  [place] will show ASLR = PSLR to show improved neural mobility combined with LE strength   Evaluation/Baseline (06/19/2022): limited Goal status: INITIAL     3.  Marika will improve the following MMTs to >/= 4/5 to show improvement in strength:  R LE and hip    Evaluation/Baseline (06/19/2022): see chart in note Goal status: INITIAL   LE MMT:   MMT Right 06/19/2022 Left 06/19/2022  Hip flexion (L2, L3) 3+ 4  Knee extension (L3) 3+ 4  Knee flexion 3+ 4+  Hip abduction Unable d/t pain Unable d/t pin  Hip extension      Hip external rotation      Hip internal rotation      Hip adduction      Ankle dorsiflexion (L4) 3- N  Ankle plantarflexion (S1) 3 N  Ankle inversion      Ankle eversion      Great Toe ext (L5)      Grossly          4.  Tamber will self report >/= 50% decrease in pain from evaluation    Evaluation/Baseline (06/19/2022): 10/10 max pain Goal status: INITIAL       PLAN: PT FREQUENCY: 1-2x/week   PT DURATION: 8 weeks (Ending 08/14/2022)   PLANNED INTERVENTIONS: Therapeutic exercises, Aquatic therapy, Therapeutic activity, Neuro Muscular re-education, Gait training,  Patient/Family education, Joint mobilization, Dry Needling, Electrical stimulation, Spinal mobilization and/or manipulation, Moist heat, Taping, Vasopneumatic device, Ionotophoresis 4mg /ml Dexamethasone, and Manual therapy   PLAN FOR NEXT SESSION: trial ext based program, TDN, progressive hip and core strengthening, neural mobility if tolerated    Helina Hullum PT 06/24/2022, 12:15 PM

## 2022-06-24 NOTE — Telephone Encounter (Signed)
Left message for patient to call back  

## 2022-06-24 NOTE — Telephone Encounter (Addendum)
Pt informed of below.  She also c/o her nerves tightening up in her right foot. She asked "if this is normal?"  She has been taking gabapentin 300 mg tid. Patient requests your advice via MyChart message.

## 2022-06-25 ENCOUNTER — Encounter: Payer: Self-pay | Admitting: *Deleted

## 2022-06-25 NOTE — Telephone Encounter (Signed)
Pt informed of below and that she can remian out of work until her 07/11/22 OV via MyChart.

## 2022-06-25 NOTE — Telephone Encounter (Signed)
Left VM for patient. If she calls back please have her speak with a nurse/CMA and inform that a nerve can cause a variety of sensations in the lower extremity when it is irritated. This is a common complaint. We'll continue with the plan and pursue an MRI if symptoms persist.   If any questions then please take the best time and phone number to call and I will try to call her back.   Myra Rude, MD [facility] 06/25/2022, 12:42 PM

## 2022-06-26 ENCOUNTER — Ambulatory Visit: Payer: BC Managed Care – PPO

## 2022-06-26 DIAGNOSIS — R2689 Other abnormalities of gait and mobility: Secondary | ICD-10-CM

## 2022-06-26 DIAGNOSIS — M5416 Radiculopathy, lumbar region: Secondary | ICD-10-CM | POA: Diagnosis not present

## 2022-06-26 DIAGNOSIS — M5441 Lumbago with sciatica, right side: Secondary | ICD-10-CM

## 2022-06-26 DIAGNOSIS — M6281 Muscle weakness (generalized): Secondary | ICD-10-CM | POA: Diagnosis not present

## 2022-06-26 NOTE — Therapy (Signed)
OUTPATIENT PHYSICAL THERAPY TREATMENT NOTE   Patient Name: Alison Vega MRN: [id number] DOB:26-Nov-1980, 41 y.o., female Today's Date: 06/26/2022  PCP: Ma Hillock, DO   REFERRING PROVIDER: Rosemarie Ax, MD   PT End of Session - 06/26/22 1245     Visit Number 3    Date for PT Re-Evaluation 08/14/22    Authorization Type BCBS - FOTO    PT Start Time 1256    PT Stop Time 1336    PT Time Calculation (min) 40 min    Activity Tolerance Patient tolerated treatment well;Patient limited by pain    Behavior During Therapy WFL for tasks assessed/performed              Past Medical History:  Diagnosis Date   Asthma    Cellulitis    cellulitis/abcess x15, "every 2 months"   Urticaria    Past Surgical History:  Procedure Laterality Date   CESAREAN SECTION     EYE SURGERY     x3   OPEN REDUCTION INTERNAL FIXATION (ORIF) METACARPAL Right 06/13/2019   Procedure: OPEN TREATMENT OF RIGHT 5TH METACARPAL FRACTURE;  Surgeon: Milly Jakob, MD;  Location: [place];  Service: Orthopedics;  Laterality: Right;   TUBAL LIGATION     Patient Active Problem List   Diagnosis Date Noted   Lumbar radiculopathy 05/21/2022   Visit for routine gyn exam 01/24/2022   Vitamin D deficiency 12/17/2021   Recurrent major depression in remission ([place]) 12/17/2021   Stress incontinence (female) (female) 12/17/2021   History of abnormal cervical Pap smear 12/17/2021   Primary insomnia 11/01/2021   Asthmatic bronchitis 11/01/2021   Moderate persistent asthma without complication [id number]   Chronic idiopathic urticaria 05/30/2021   Nonallergic rhinitis 09/13/2019    THERAPY DIAG:  Low back pain with right-sided sciatica, unspecified back pain laterality, unspecified chronicity  Muscle weakness  Other abnormalities of gait and mobility  REFERRING DIAG: Lumbar radiculopathy [M54.16]  PERTINENT HISTORY: Stress incontinence    PRECAUTIONS/RESTRICTIONS:    None  SUBJECTIVE: Patient reports pain in her lower back and R hip today.   Pain:  Are you having pain? Yes Pain location: anterior thigh, glute, shooting pain to foot NPRS scale:  Current 8/10  average 9/10  Aggravating factors: walking, driving for long periods Relieving factors: rest, "stay off of it" Pain description: intermittent, constant, burning, and aching Stage: Subacute 24 hour pattern: no clear pattern   OBJECTIVE:  DIAGNOSTIC FINDINGS:  X-ray of hip and lumbar spine (-)    GENERAL OBSERVATION/GAIT:           Highly antalgic gait with reduce time in stance on R   SENSATION:          Light touch: Deficits R LE L4->L5   MUSCLE LENGTH: Hamstrings: Right subtle, w/ pain restriction; Left no restriction ASLR: Right ASLR significantly reduced compared to PSLR ; Left ASLR = PSLR Thomas test: Right significant, w/ pain restriction; Left no restriction   LUMBAR AROM   AROM AROM  06/19/2022  Flexion limited by 50%, w/ concordant pain  Extension limited by 50%  Right lateral flexion limited by 50%, w/ concordant pain  Left lateral flexion WNL  Right rotation limited by 50%, w/ concordant pain  Left rotation limited by 25%    (Blank rows = not tested)   DIRECTIONAL PREFERENCE:           Lumbar ext better than flexion   LE MMT:   MMT Right 06/19/2022 Left 06/19/2022  Hip flexion (L2, L3) 3+ 4  Knee extension (L3) 3+ 4  Knee flexion 3+ 4+  Hip abduction Unable d/t pain Unable d/t pin  Hip extension      Hip external rotation      Hip internal rotation      Hip adduction      Ankle dorsiflexion (L4) 3- N  Ankle plantarflexion (S1) 3 N  Ankle inversion      Ankle eversion      Great Toe ext (L5) D N  Grossly        (Blank rows = not tested, score listed is out of 5 possible points.  N = WNL, D = diminished, C = clear for gross weakness with myotome testing, * = concordant pain with testing)     LE ROM:   ROM Right 06/19/2022 Left 06/19/2022  Hip  flexion WNL WNL  Hip extension      Hip abduction      Hip adduction      Hip internal rotation      Hip external rotation      Knee flexion      Knee extension      Ankle dorsiflexion      Ankle plantarflexion      Ankle inversion      Ankle eversion         (Blank rows = not tested, N = WNL, * = concordant pain with testing)   Functional Tests   Eval (06/19/2022)                                                                                                             LUMBAR SPECIAL TESTS:  Straight leg raise: L (-), R (+) Slump: L (-), R (-)    SPINAL SEGMENTAL MOBILITY ASSESSMENT:  PA lower lumbar spine reproduces sxs and improves pai   PATIENT SURVEYS:  FOTO 45 -> 67     TODAY'S TREATMENT  Creating, reviewing, and completing below HEP   PATIENT EDUCATION:  POC, diagnosis, prognosis, HEP, and outcome measures.  Pt educated via explanation, demonstration, and handout (HEP).  Pt confirms understanding verbally.    HOME EXERCISE PROGRAM: Access Code: [email or web address] URL: [email or web address] Date: 06/19/2022 Prepared by: Alphonzo Severance   Exercises - Static Prone on Elbows  - 5 x daily - 7 x weekly - 3 sets - 10 reps   ASTERISK SIGNS     Asterisk Signs Eval (06/19/2022)            Pain with walking 10/10            R SLR (+)            R ASLR does not equal PSLR true                                            TREATMENT 8/24:  Therapeutic Exercise: - nu-step L5 64m while taking subjective  and planning session with patient - LTR - 30x - hip adduction squeeze - 3x10 - 2'' - HS roll out swiss ball - 20x - HL abdominal isometric squeeze - 3x10 - Prone on elbows x2' - Prone press ups 2x10  TREATMENT 8/22:  Therapeutic Exercise: - nu-step L5 18m while taking subjective and planning session with patient - LTR - 30x - hip adduction squeeze - 3x10 - 2'' - HS roll out swiss ball - 20x - R sciatic nerve glide - legs supported on ball  - 2x20 - HL abdominal isometric squeeze - 3x10``  Manual Therapy: - CPA and UPA lower lumbar spine, pt in prone GII - GIII   ASSESSMENT:   CLINICAL IMPRESSION: Patient presents to PT with continued pain in her lower back and R hip and she reports the mobilizations from last session lasted until she got home that day. Session today focused on basic core and proximal hip strengthening while in supine. She is somewhat limited by pain throughout session. Patient continues to benefit from skilled PT services and should be progressed as able to improve functional independence.    OBJECTIVE IMPAIRMENTS: Pain, lumbar ROM, LE and hip strength   ACTIVITY LIMITATIONS: work, ADLs, bending, lifting   PERSONAL FACTORS: See medical history and pertinent history     REHAB POTENTIAL: Good   CLINICAL DECISION MAKING: Evolving/moderate complexity   EVALUATION COMPLEXITY: Moderate     GOALS:     SHORT TERM GOALS: Target date: 07/10/2022   [facility] will be >75% HEP compliant to improve carryover between sessions and facilitate independent management of condition   Evaluation (06/19/2022): ongoing Goal status: INITIAL     LONG TERM GOALS: Target date: 08/14/2022   [facility] will improve FOTO score to 67 as a proxy for functional improvement   Evaluation/Baseline (06/19/2022): 45 Goal status: INITIAL     2.  Loukisha will show ASLR = PSLR to show improved neural mobility combined with LE strength   Evaluation/Baseline (06/19/2022): limited Goal status: INITIAL     3.  Happy will improve the following MMTs to >/= 4/5 to show improvement in strength:  R LE and hip    Evaluation/Baseline (06/19/2022): see chart in note Goal status: INITIAL   LE MMT:   MMT Right 06/19/2022 Left 06/19/2022  Hip flexion (L2, L3) 3+ 4  Knee extension (L3) 3+ 4  Knee flexion 3+ 4+  Hip abduction Unable d/t pain Unable d/t pin  Hip extension      Hip external rotation      Hip internal rotation      Hip  adduction      Ankle dorsiflexion (L4) 3- N  Ankle plantarflexion (S1) 3 N  Ankle inversion      Ankle eversion      Great Toe ext (L5)      Grossly          4.  Sheera will self report >/= 50% decrease in pain from evaluation    Evaluation/Baseline (06/19/2022): 10/10 max pain Goal status: INITIAL       PLAN: PT FREQUENCY: 1-2x/week   PT DURATION: 8 weeks (Ending 08/14/2022)   PLANNED INTERVENTIONS: Therapeutic exercises, Aquatic therapy, Therapeutic activity, Neuro Muscular re-education, Gait training, Patient/Family education, Joint mobilization, Dry Needling, Electrical stimulation, Spinal mobilization and/or manipulation, Moist heat, Taping, Vasopneumatic device, Ionotophoresis 4mg /ml Dexamethasone, and Manual therapy   PLAN FOR NEXT SESSION: trial ext based program, TDN, progressive hip and core strengthening, neural mobility if tolerated    Colletta Maryland  Williams PTA 06/26/2022, 1:36 PM

## 2022-07-01 ENCOUNTER — Encounter: Payer: Self-pay | Admitting: Physical Therapy

## 2022-07-01 ENCOUNTER — Ambulatory Visit: Payer: BC Managed Care – PPO | Admitting: Physical Therapy

## 2022-07-01 DIAGNOSIS — M5441 Lumbago with sciatica, right side: Secondary | ICD-10-CM

## 2022-07-01 DIAGNOSIS — M6281 Muscle weakness (generalized): Secondary | ICD-10-CM

## 2022-07-01 DIAGNOSIS — R2689 Other abnormalities of gait and mobility: Secondary | ICD-10-CM

## 2022-07-01 DIAGNOSIS — M5416 Radiculopathy, lumbar region: Secondary | ICD-10-CM | POA: Diagnosis not present

## 2022-07-01 NOTE — Therapy (Signed)
OUTPATIENT PHYSICAL THERAPY TREATMENT NOTE   Patient Name: Alison Vega MRN: [id number] DOB:01-03-1981, 41 y.o., female Today's Date: 07/01/2022  PCP: Ma Hillock, DO   REFERRING PROVIDER: Rosemarie Ax, MD   PT End of Session - 07/01/22 1022     Visit Number 4    Number of Visits --   1-2x/week   Date for PT Re-Evaluation 08/14/22    Authorization Type BCBS - FOTO    PT Start Time 1030    PT Stop Time 1112    PT Time Calculation (min) 42 min              Past Medical History:  Diagnosis Date   Asthma    Cellulitis    cellulitis/abcess x15, "every 2 months"   Urticaria    Past Surgical History:  Procedure Laterality Date   CESAREAN SECTION     EYE SURGERY     x3   OPEN REDUCTION INTERNAL FIXATION (ORIF) METACARPAL Right 06/13/2019   Procedure: OPEN TREATMENT OF RIGHT 5TH METACARPAL FRACTURE;  Surgeon: Milly Jakob, MD;  Location: [place];  Service: Orthopedics;  Laterality: Right;   TUBAL LIGATION     Patient Active Problem List   Diagnosis Date Noted   Lumbar radiculopathy 05/21/2022   Visit for routine gyn exam 01/24/2022   Vitamin D deficiency 12/17/2021   Recurrent major depression in remission ([place]) 12/17/2021   Stress incontinence (female) (female) 12/17/2021   History of abnormal cervical Pap smear 12/17/2021   Primary insomnia 11/01/2021   Asthmatic bronchitis 11/01/2021   Moderate persistent asthma without complication 36/62/9476   Chronic idiopathic urticaria 05/30/2021   Nonallergic rhinitis 09/13/2019    THERAPY DIAG:  Low back pain with right-sided sciatica, unspecified back pain laterality, unspecified chronicity  Muscle weakness  Other abnormalities of gait and mobility  REFERRING DIAG: Lumbar radiculopathy [M54.16]  PERTINENT HISTORY: Stress incontinence    PRECAUTIONS/RESTRICTIONS:   None  SUBJECTIVE: Pt reports that overall her back feels roughly the same.  She feels the PA mobilization does help  for pain relief.   Pain:  Are you having pain? Yes Pain location: anterior thigh, glute, shooting pain to foot NPRS scale:  Current 8/10  average 9/10  Aggravating factors: walking, driving for long periods Relieving factors: rest, "stay off of it" Pain description: intermittent, constant, burning, and aching Stage: Subacute 24 hour pattern: no clear pattern   OBJECTIVE:    HOME EXERCISE PROGRAM: Access Code: [email or web address] URL: [email or web address] Date: 06/19/2022 Prepared by: Shearon Balo   Exercises - Static Prone on Elbows  - 5 x daily - 7 x weekly - 3 sets - 10 reps   ASTERISK SIGNS     Asterisk Signs Eval (06/19/2022) 8/29           Pain with walking 10/10 10/10 with activity           R SLR (+) (+)           R ASLR does not equal PSLR true  true                                           TREATMENT 8/29:  Therapeutic Exercise: - nu-step L7 17mwhile taking subjective and planning session with patient - LTR - 30x - HL alternating clam - GTB - hip adduction squeeze - 3x10 - 2'' - Sciatic nerve glide -  15x ea on swiss ball - HS roll out swiss ball - 20x - HL abdominal isometric squeeze - 3x10  Manual Therapy: - CPA and UPA lower lumbar spine, pt in prone GII - GIII  TREATMENT 8/24:  Therapeutic Exercise: - nu-step L5 58mwhile taking subjective and planning session with patient - LTR - 30x - hip adduction squeeze - 3x10 - 2'' - HS roll out swiss ball - 20x - HL abdominal isometric squeeze - 3x10 - Prone on elbows x2' - Prone press ups 2x10  TREATMENT 8/22:  Therapeutic Exercise: - nu-step L5 597mhile taking subjective and planning session with patient - LTR - 30x - hip adduction squeeze - 3x10 - 2'' - HS roll out swiss ball - 20x - R sciatic nerve glide - legs supported on ball - 2x20 - HL abdominal isometric squeeze - 3x10``  Manual Therapy: - CPA and UPA lower lumbar spine, pt in prone GII - GIII   ASSESSMENT:   CLINICAL  IMPRESSION: Reginna tolerated session well with no adverse reaction.  She gets significant relief from PA mobilization, but benefit seems to be transient (2-4  hours).  The benefit of PA mobilization does suggest nerve root irritation, likely disk.  We will continue for this week, but may D/C if no lasting positive benefit is seen.    OBJECTIVE IMPAIRMENTS: Pain, lumbar ROM, LE and hip strength   ACTIVITY LIMITATIONS: work, ADLs, bending, lifting   PERSONAL FACTORS: See medical history and pertinent history     REHAB POTENTIAL: Good   CLINICAL DECISION MAKING: Evolving/moderate complexity   EVALUATION COMPLEXITY: Moderate     GOALS:     SHORT TERM GOALS: Target date: 07/10/2022   [facility] to improve carryover between sessions and facilitate independent management of condition   Evaluation (06/19/2022): ongoing Goal status: MET 9/7     LONG TERM GOALS: Target date: 08/14/2022   [facility] FOTO score to 67 as a proxy for functional improvement   Evaluation/Baseline (06/19/2022): 45 Goal status: INITIAL     2.  Inita will show ASLR = PSLR to show improved neural mobility combined with LE strength   Evaluation/Baseline (06/19/2022): limited Goal status: INITIAL     3.  Shaletta will improve the following MMTs to >/= 4/5 to show improvement in strength:  R LE and hip    Evaluation/Baseline (06/19/2022): see chart in note Goal status: INITIAL   LE MMT:   MMT Right 06/19/2022 Left 06/19/2022  Hip flexion (L2, L3) 3+ 4  Knee extension (L3) 3+ 4  Knee flexion 3+ 4+  Hip abduction Unable d/t pain Unable d/t pin  Hip extension      Hip external rotation      Hip internal rotation      Hip adduction      Ankle dorsiflexion (L4) 3- N  Ankle plantarflexion (S1) 3 N  Ankle inversion      Ankle eversion      Great Toe ext (L5)      Grossly          4.  Mikinzie will self report >/= 50% decrease in pain from evaluation    Evaluation/Baseline  (06/19/2022): 10/10 max pain Goal status: INITIAL       PLAN: PT FREQUENCY: 1-2x/week   PT DURATION: 8 weeks (Ending 08/14/2022)   PLANNED INTERVENTIONS: Therapeutic exercises, Aquatic therapy, Therapeutic activity, Neuro Muscular re-education, Gait training, Patient/Family education, Joint mobilization, Dry Needling, Electrical stimulation, Spinal mobilization  and/or manipulation, Moist heat, Taping, Vasopneumatic device, Ionotophoresis 79m/ml Dexamethasone, and Manual therapy   PLAN FOR NEXT SESSION: trial ext based program, TDN, progressive hip and core strengthening, neural mobility if tolerated    KKevan NyReinhartsen PT 07/01/2022, 11:12 AM

## 2022-07-03 ENCOUNTER — Ambulatory Visit: Payer: BC Managed Care – PPO

## 2022-07-03 DIAGNOSIS — R2689 Other abnormalities of gait and mobility: Secondary | ICD-10-CM | POA: Diagnosis not present

## 2022-07-03 DIAGNOSIS — M6281 Muscle weakness (generalized): Secondary | ICD-10-CM

## 2022-07-03 DIAGNOSIS — M5441 Lumbago with sciatica, right side: Secondary | ICD-10-CM | POA: Diagnosis not present

## 2022-07-03 DIAGNOSIS — M5416 Radiculopathy, lumbar region: Secondary | ICD-10-CM | POA: Diagnosis not present

## 2022-07-03 NOTE — Therapy (Signed)
OUTPATIENT PHYSICAL THERAPY TREATMENT NOTE   Patient Name: Alison Vega MRN: [id number] DOB:July 22, 1981, 41 y.o., female Today's Date: 07/03/2022  PCP: Ma Hillock, DO   REFERRING PROVIDER: Rosemarie Ax, MD   PT End of Session - 07/03/22 1153     Visit Number 5    Date for PT Re-Evaluation 08/14/22    Authorization Type BCBS - [place]OTO    PT Start Time 1210    PT Stop Time 1250    PT Time Calculation (min) 40 min    Activity Tolerance Patient tolerated treatment well;Patient limited by pain    Behavior During Therapy WFL for tasks assessed/performed              Past Medical History:  Diagnosis Date   Asthma    Cellulitis    cellulitis/abcess x15, "every 2 months"   Urticaria    Past Surgical History:  Procedure Laterality Date   CESAREAN SECTION     EYE SURGERY     x3   OPEN REDUCTION INTERNAL FIXATION (ORIF) METACARPAL Right 06/13/2019   Procedure: OPEN TREATMENT OF RIGHT 5TH METACARPAL FRACTURE;  Surgeon: Milly Jakob, MD;  Location: [place];  Service: Orthopedics;  Laterality: Right;   TUBAL LIGATION     Patient Active Problem List   Diagnosis Date Noted   Lumbar radiculopathy 05/21/2022   Visit for routine gyn exam 01/24/2022   Vitamin D deficiency 12/17/2021   Recurrent major depression in remission ([place]) 12/17/2021   Stress incontinence (female) (female) 12/17/2021   History of abnormal cervical Pap smear 12/17/2021   Primary insomnia 11/01/2021   Asthmatic bronchitis 11/01/2021   Moderate persistent asthma without complication 38/75/6433   Chronic idiopathic urticaria 05/30/2021   Nonallergic rhinitis 09/13/2019    THERAPY DIAG:  Low back pain with right-sided sciatica, unspecified back pain laterality, unspecified chronicity  Muscle weakness  Other abnormalities of gait and mobility  REFERRING DIAG: Lumbar radiculopathy [M54.16]  PERTINENT HISTORY: Stress incontinence    PRECAUTIONS/RESTRICTIONS:    None  SUBJECTIVE: Patient reports that overall her pain has not changed, she is taking Gabapentin with no relief. She states the pain today goes from her R hip up her back to her neck.   Pain:  Are you having pain? Yes Pain location: anterior thigh, glute, shooting pain to foot NPRS scale:  Current 12/10  average 9/10  Aggravating factors: walking, driving for long periods Relieving factors: rest, "stay off of it" Pain description: intermittent, constant, burning, and aching Stage: Subacute 24 hour pattern: no clear pattern   OBJECTIVE:    HOME EXERCISE PROGRAM: Access Code: [email or web address] URL: [email or web address] Date: 06/19/2022 Prepared by: Shearon Balo   Exercises - Static Prone on Elbows  - 5 x daily - 7 x weekly - 3 sets - 10 reps   ASTERISK SIGNS     Asterisk Signs Eval (06/19/2022) 8/29           Pain with walking 10/10 10/10 with activity           R SLR (+) (+)           R ASLR does not equal PSLR true  true                                          TREATMENT 8/31: Therapeutic Exercise: - nu-step L7 67mwhile taking subjective and planning session with patient -  LTR - 30x - HL alternating clam - GTB - hip adduction squeeze - 3x10 - 2'' - HS roll out swiss ball - 30x  - HL abdominal isometric squeeze - 3x10 - Prone on elbows x2' Manual Therapy: - CPA and UPA lower lumbar spine, pt in prone GII - GIII - performed by certified therapist Shearon Balo, DPT  TREATMENT 8/29:  Therapeutic Exercise: - nu-step L7 71mwhile taking subjective and planning session with patient - LTR - 30x - HL alternating clam - GTB - hip adduction squeeze - 3x10 - 2'' - Sciatic nerve glide - 15x ea on swiss ball - HS roll out swiss ball - 20x - HL abdominal isometric squeeze - 3x10  Manual Therapy: - CPA and UPA lower lumbar spine, pt in prone GII - GIII  TREATMENT 8/24:  Therapeutic Exercise: - nu-step L5 538mhile taking subjective and planning  session with patient - LTR - 30x - hip adduction squeeze - 3x10 - 2'' - HS roll out swiss ball - 20x - HL abdominal isometric squeeze - 3x10 - Prone on elbows x2' - Prone press ups 2x10    ASSESSMENT:   CLINICAL IMPRESSION: Patient presents to PT with "12/10" pain and reports that her medication hasn't been helping with the pain. She receives immediate benefit from spinal mobilizations, performed by certified therapist, but has a short term effect. She states she has a follow up with her MD next week. Session today continued to focus on gentle mat strengthening exercises. She is still limited by pain throughout session. Patient continues to benefit from skilled PT services and should be progressed as able to improve functional independence.    OBJECTIVE IMPAIRMENTS: Pain, lumbar ROM, LE and hip strength   ACTIVITY LIMITATIONS: work, ADLs, bending, lifting   PERSONAL FACTORS: See medical history and pertinent history     REHAB POTENTIAL: Good   CLINICAL DECISION MAKING: Evolving/moderate complexity   EVALUATION COMPLEXITY: Moderate     GOALS:     SHORT TERM GOALS: Target date: 07/10/2022   [facility] >75% [facility]P compliant to improve carryover between sessions and facilitate independent management of condition   Evaluation (06/19/2022): ongoing Goal status: MET 9/7     LONG TERM GOALS: Target date: 08/14/2022   [facility] FOTO score to 67 as a proxy for functional improvement   Evaluation/Baseline (06/19/2022): 45 Goal status: INITIAL     2.  Quintasha will show ASLR = PSLR to show improved neural mobility combined with LE strength   Evaluation/Baseline (06/19/2022): limited Goal status: INITIAL     3.  Shaquanta will improve the following MMTs to >/= 4/5 to show improvement in strength:  R LE and hip    Evaluation/Baseline (06/19/2022): see chart in note Goal status: INITIAL   LE MMT:   MMT Right 06/19/2022 Left 06/19/2022  Hip flexion (L2, L3) 3+ 4  Knee  extension (L3) 3+ 4  Knee flexion 3+ 4+  Hip abduction Unable d/t pain Unable d/t pin  Hip extension      Hip external rotation      Hip internal rotation      Hip adduction      Ankle dorsiflexion (L4) 3- N  Ankle plantarflexion (S1) 3 N  Ankle inversion      Ankle eversion      Great Toe ext (L5)      Grossly          4.  Lynnet will self report >/= 50% decrease  in pain from evaluation    Evaluation/Baseline (06/19/2022): 10/10 max pain Goal status: INITIAL       PLAN: PT FREQUENCY: 1-2x/week   PT DURATION: 8 weeks (Ending 08/14/2022)   PLANNED INTERVENTIONS: Therapeutic exercises, Aquatic therapy, Therapeutic activity, Neuro Muscular re-education, Gait training, Patient/Family education, Joint mobilization, Dry Needling, Electrical stimulation, Spinal mobilization and/or manipulation, Moist heat, Taping, Vasopneumatic device, Ionotophoresis 46m/ml Dexamethasone, and Manual therapy   PLAN FOR NEXT SESSION: trial ext based program, TDN, progressive hip and core strengthening, neural mobility if tolerated    SMargarette CanadaPTA 07/03/2022, 12:49 PM

## 2022-07-08 ENCOUNTER — Encounter: Payer: Self-pay | Admitting: Physical Therapy

## 2022-07-08 ENCOUNTER — Ambulatory Visit: Payer: BC Managed Care – PPO | Attending: Family Medicine | Admitting: Physical Therapy

## 2022-07-08 DIAGNOSIS — M6281 Muscle weakness (generalized): Secondary | ICD-10-CM | POA: Insufficient documentation

## 2022-07-08 DIAGNOSIS — R2689 Other abnormalities of gait and mobility: Secondary | ICD-10-CM | POA: Diagnosis not present

## 2022-07-08 DIAGNOSIS — M5441 Lumbago with sciatica, right side: Secondary | ICD-10-CM | POA: Insufficient documentation

## 2022-07-08 NOTE — Therapy (Signed)
OUTPATIENT PHYSICAL THERAPY TREATMENT NOTE   Patient Name: Alison Vega MRN: [id number] DOB:October 04, 1981, 41 y.o., female Today's Date: 07/08/2022  PCP: Alison Hillock, DO   REFERRING PROVIDER: Rosemarie Ax, MD   PT End of Session - 07/08/22 1257     Visit Number 6    Date for PT Re-Evaluation 08/14/22    Authorization Type BCBS - FOTO    PT Start Time 1300    PT Stop Time 1341    PT Time Calculation (min) 41 min    Activity Tolerance Patient tolerated treatment well;Patient limited by pain    Behavior During Therapy WFL for tasks assessed/performed              Past Medical History:  Diagnosis Date   Asthma    Cellulitis    cellulitis/abcess x15, "every 2 months"   Urticaria    Past Surgical History:  Procedure Laterality Date   CESAREAN SECTION     EYE SURGERY     x3   OPEN REDUCTION INTERNAL FIXATION (ORIF) METACARPAL Right 06/13/2019   Procedure: OPEN TREATMENT OF RIGHT 5TH METACARPAL FRACTURE;  Surgeon: Alison Jakob, MD;  Location: [place];  Service: Orthopedics;  Laterality: Right;   TUBAL LIGATION     Patient Active Problem List   Diagnosis Date Noted   Lumbar radiculopathy 05/21/2022   Visit for routine gyn exam 01/24/2022   Vitamin D deficiency 12/17/2021   Recurrent major depression in remission ([place]) 12/17/2021   Stress incontinence (female) (female) 12/17/2021   History of abnormal cervical Pap smear 12/17/2021   Primary insomnia 11/01/2021   Asthmatic bronchitis 11/01/2021   Moderate persistent asthma without complication 49/82/6415   Chronic idiopathic urticaria 05/30/2021   Nonallergic rhinitis 09/13/2019    THERAPY DIAG:  Low back pain with right-sided sciatica, unspecified back pain laterality, unspecified chronicity  Muscle weakness  Other abnormalities of gait and mobility  REFERRING DIAG: Lumbar radiculopathy [M54.16]  PERTINENT HISTORY: Stress incontinence    PRECAUTIONS/RESTRICTIONS:    None  SUBJECTIVE: Pt reports that she is feeling ok right now, but if she stands or sits for long periods her pain increases.  MT does help with her pain for short durations.  Pain:  Are you having pain? Yes Pain location: anterior thigh, glute, shooting pain to foot NPRS scale:  Current 5/10  average 9/10  Aggravating factors: walking, driving for long periods Relieving factors: rest, "stay off of it" Pain description: intermittent, constant, burning, and aching Stage: Subacute 24 hour pattern: no clear pattern   OBJECTIVE:    HOME EXERCISE PROGRAM: Access Code: [email or web address] URL: [email or web address] Date: 06/19/2022 Prepared by: Alison Vega   Exercises - Static Prone on Elbows  - 5 x daily - 7 x weekly - 3 sets - 10 reps   ASTERISK SIGNS     Asterisk Signs Eval (06/19/2022) 8/29  9/5         Pain with walking 10/10 10/10 with activity           R SLR (+) (+)  (+)         R ASLR does not equal PSLR true  true                                           TREATMENT 9/5: Therapeutic Exercise: - nu-step L7 9mwhile taking subjective and planning session with patient -  LTR - 30x - HL alternating clam - Blue TB - hip adduction pilates ring squeeze - 2x10 - 2'' - supine bridge - 2x10 - very small arc - Sciatic nerve glide - 20x - HS roll out swiss ball - 30x  - HL abdominal isometric squeeze - 3x10  Manual Therapy: - CPA and UPA lower lumbar spine, pt in prone GII - GIII  TREATMENT 8/31: Therapeutic Exercise: - nu-step L7 33mwhile taking subjective and planning session with patient - LTR - 30x - HL alternating clam - GTB - hip adduction squeeze - 3x10 - 2'' - HS roll out swiss ball - 30x  - HL abdominal isometric squeeze - 3x10 - Prone on elbows x2' Manual Therapy: - CPA and UPA lower lumbar spine, pt in prone GII - GIII - performed by certified therapist Alison Vega DPT  TREATMENT 8/29:  Therapeutic Exercise: - nu-step L7 542mhile  taking subjective and planning session with patient - LTR - 30x - HL alternating clam - GTB - hip adduction squeeze - 3x10 - 2'' - Sciatic nerve glide - 15x ea on swiss ball - HS roll out swiss ball - 20x - HL abdominal isometric squeeze - 3x10  Manual Therapy: - CPA and UPA lower lumbar spine, pt in prone GII - GIII  TREATMENT 8/24:  Therapeutic Exercise: - nu-step L5 23m23mile taking subjective and planning session with patient - LTR - 30x - hip adduction squeeze - 3x10 - 2'' - HS roll out swiss ball - 20x - HL abdominal isometric squeeze - 3x10 - Prone on elbows x2' - Prone press ups 2x10    ASSESSMENT:   CLINICAL IMPRESSION: Alison Vega tolerated session well with no adverse reaction.  We continue to work on basic mat core/hip exercises with slow progression.  She responds well to manual therapy with significant pain relief for ~4 hours.    OBJECTIVE IMPAIRMENTS: Pain, lumbar ROM, LE and hip strength   ACTIVITY LIMITATIONS: work, ADLs, bending, lifting   PERSONAL FACTORS: See medical history and pertinent history     REHAB POTENTIAL: Good   CLINICAL DECISION MAKING: Evolving/moderate complexity   EVALUATION COMPLEXITY: Moderate     GOALS:     SHORT TERM GOALS: Target date: 07/10/2022   [facility] >75[facility]P [facility] to improve carryover between sessions and facilitate independent management of condition   Evaluation (06/19/2022): ongoing Goal status: MET 9/7     LONG TERM GOALS: Target date: 08/14/2022   [facility] FOTO score to 67 as a proxy for functional improvement   Evaluation/Baseline (06/19/2022): 45 Goal status: INITIAL     2.  Alison Vega will show ASLR = PSLR to show improved neural mobility combined with LE strength   Evaluation/Baseline (06/19/2022): limited Goal status: INITIAL     3.  Alison Vega will improve the following MMTs to >/= 4/5 to show improvement in strength:  R LE and hip    Evaluation/Baseline (06/19/2022): see chart in  note Goal status: INITIAL   LE MMT:   MMT Right 06/19/2022 Left 06/19/2022  Hip flexion (L2, L3) 3+ 4  Knee extension (L3) 3+ 4  Knee flexion 3+ 4+  Hip abduction Unable d/t pain Unable d/t pin  Hip extension      Hip external rotation      Hip internal rotation      Hip adduction      Ankle dorsiflexion (L4) 3- N  Ankle plantarflexion (S1) 3 N  Ankle inversion  Ankle eversion      Great Toe ext (L5)      Grossly          4.  Cassara will self report >/= 50% decrease in pain from evaluation    Evaluation/Baseline (06/19/2022): 10/10 max pain Goal status: INITIAL       PLAN: PT FREQUENCY: 1-2x/week   PT DURATION: 8 weeks (Ending 08/14/2022)   PLANNED INTERVENTIONS: Therapeutic exercises, Aquatic therapy, Therapeutic activity, Neuro Muscular re-education, Gait training, Patient/Family education, Joint mobilization, Dry Needling, Electrical stimulation, Spinal mobilization and/or manipulation, Moist heat, Taping, Vasopneumatic device, Ionotophoresis 76m/ml Dexamethasone, and Manual therapy   PLAN FOR NEXT SESSION: trial ext based program, TDN, progressive hip and core strengthening, neural mobility if tolerated    KKevan NyReinhartsen PT 07/08/2022, 1:49 PM

## 2022-07-09 NOTE — Therapy (Signed)
OUTPATIENT PHYSICAL THERAPY TREATMENT NOTE   Patient Name: Alison Vega MRN: [id number] DOB:Jan 30, 1981, 41 y.o., female Today's Date: 07/10/2022  PCP: Ma Hillock, DO   REFERRING PROVIDER: Rosemarie Ax, MD   PT End of Session - 07/10/22 1051     Visit Number 7    Date for PT Re-Evaluation 08/14/22    Authorization Type BCBS - FOTO    PT Start Time 1100    PT Stop Time 1140    PT Time Calculation (min) 40 min    Activity Tolerance Patient tolerated treatment well;Patient limited by pain    Behavior During Therapy WFL for tasks assessed/performed              Past Medical History:  Diagnosis Date   Asthma    Cellulitis    cellulitis/abcess x15, "every 2 months"   Urticaria    Past Surgical History:  Procedure Laterality Date   CESAREAN SECTION     EYE SURGERY     x3   OPEN REDUCTION INTERNAL FIXATION (ORIF) METACARPAL Right 06/13/2019   Procedure: OPEN TREATMENT OF RIGHT 5TH METACARPAL FRACTURE;  Surgeon: Milly Jakob, MD;  Location: [place];  Service: Orthopedics;  Laterality: Right;   TUBAL LIGATION     Patient Active Problem List   Diagnosis Date Noted   Lumbar radiculopathy 05/21/2022   Visit for routine gyn exam 01/24/2022   Vitamin D deficiency 12/17/2021   Recurrent major depression in remission ([place]) 12/17/2021   Stress incontinence (female) (female) 12/17/2021   History of abnormal cervical Pap smear 12/17/2021   Primary insomnia 11/01/2021   Asthmatic bronchitis 11/01/2021   Moderate persistent asthma without complication 34/19/6222   Chronic idiopathic urticaria 05/30/2021   Nonallergic rhinitis 09/13/2019    THERAPY DIAG:  Low back pain with right-sided sciatica, unspecified back pain laterality, unspecified chronicity  Muscle weakness  Other abnormalities of gait and mobility  REFERRING DIAG: Lumbar radiculopathy [M54.16]  PERTINENT HISTORY: Stress incontinence    PRECAUTIONS/RESTRICTIONS:    None  SUBJECTIVE: Patient reports very high pain today and that her back "locked up" yesterday and she wasn't able to walk.  Pain:  Are you having pain? Yes Pain location: anterior thigh, glute, shooting pain to foot NPRS scale:  Current 10/10  average 9/10  Aggravating factors: walking, driving for long periods Relieving factors: rest, "stay off of it" Pain description: intermittent, constant, burning, and aching Stage: Subacute 24 hour pattern: no clear pattern   OBJECTIVE:    HOME EXERCISE PROGRAM: Access Code: [email or web address] URL: [email or web address] Date: 06/19/2022 Prepared by: Shearon Balo   Exercises - Static Prone on Elbows  - 5 x daily - 7 x weekly - 3 sets - 10 reps   ASTERISK SIGNS     Asterisk Signs Eval (06/19/2022) 8/29  9/5         Pain with walking 10/10 10/10 with activity           R SLR (+) (+)  (+)         R ASLR does not equal PSLR true  true                                          TREATMENT 9/7: Therapeutic Exercise: - nu-step L7 39mwhile taking subjective and planning session with patient - LTR - 30x - HL alternating clam - Blue TB - hip adduction  ball squeeze - 2x10 - 2'' - supine bridge - 2x10 - very small arc - Sciatic nerve glide - 20x - HS roll out swiss ball - 30x  - HL abdominal isometric squeeze - 3x10  TREATMENT 9/5: Therapeutic Exercise: - nu-step L7 65mwhile taking subjective and planning session with patient - LTR - 30x - HL alternating clam - Blue TB - hip adduction pilates ring squeeze - 2x10 - 2'' - supine bridge - 2x10 - very small arc - Sciatic nerve glide - 20x - HS roll out swiss ball - 30x  - HL abdominal isometric squeeze - 3x10  Manual Therapy: - CPA and UPA lower lumbar spine, pt in prone GII - GIII  TREATMENT 8/31: Therapeutic Exercise: - nu-step L7 531mhile taking subjective and planning session with patient - LTR - 30x - HL alternating clam - GTB - hip adduction squeeze - 3x10 -  2'' - HS roll out swiss ball - 30x  - HL abdominal isometric squeeze - 3x10 - Prone on elbows x2' Manual Therapy: - CPA and UPA lower lumbar spine, pt in prone GII - GIII - performed by certified therapist KaShearon BaloDPT    ASSESSMENT:   CLINICAL IMPRESSION: Patient presents to PT with high levels of pain in her lower and mid thoracic spine and reports that her back has been "locking up." Session today focused on mat based core and proximal hip strengthening. She remains limited by pain throughout session. Patient continues to benefit from skilled PT services and should be progressed as able to improve functional independence.   OBJECTIVE IMPAIRMENTS: Pain, lumbar ROM, LE and hip strength   ACTIVITY LIMITATIONS: work, ADLs, bending, lifting   PERSONAL FACTORS: See medical history and pertinent history     REHAB POTENTIAL: Good   CLINICAL DECISION MAKING: Evolving/moderate complexity   EVALUATION COMPLEXITY: Moderate     GOALS:     SHORT TERM GOALS: Target date: 07/10/2022   [facility] >75% [facility]P compliant to improve carryover between sessions and facilitate independent management of condition   Evaluation (06/19/2022): ongoing Goal status: MET 9/7     LONG TERM GOALS: Target date: 08/14/2022   [facility] FOTO score to 67 as a proxy for functional improvement   Evaluation/Baseline (06/19/2022): 45 Goal status: INITIAL     2.  [place] will show ASLR = PSLR to show improved neural mobility combined with LE strength   Evaluation/Baseline (06/19/2022): limited Goal status: INITIAL     3.  Renesmay will improve the following MMTs to >/= 4/5 to show improvement in strength:  R LE and hip    Evaluation/Baseline (06/19/2022): see chart in note Goal status: INITIAL   LE MMT:   MMT Right 06/19/2022 Left 06/19/2022  Hip flexion (L2, L3) 3+ 4  Knee extension (L3) 3+ 4  Knee flexion 3+ 4+  Hip abduction Unable d/t pain Unable d/t pin  Hip extension       Hip external rotation      Hip internal rotation      Hip adduction      Ankle dorsiflexion (L4) 3- N  Ankle plantarflexion (S1) 3 N  Ankle inversion      Ankle eversion      Great Toe ext (L5)      Grossly          4.  Ishia will self report >/= 50% decrease in pain from evaluation    Evaluation/Baseline (06/19/2022): 10/10 max pain Goal status: INITIAL  PLAN: PT FREQUENCY: 1-2x/week   PT DURATION: 8 weeks (Ending 08/14/2022)   PLANNED INTERVENTIONS: Therapeutic exercises, Aquatic therapy, Therapeutic activity, Neuro Muscular re-education, Gait training, Patient/Family education, Joint mobilization, Dry Needling, Electrical stimulation, Spinal mobilization and/or manipulation, Moist heat, Taping, Vasopneumatic device, Ionotophoresis 42m/ml Dexamethasone, and Manual therapy   PLAN FOR NEXT SESSION: trial ext based program, TDN, progressive hip and core strengthening, neural mobility if tolerated    SMargarette CanadaPTA 07/10/2022, 11:40 AM

## 2022-07-10 ENCOUNTER — Ambulatory Visit: Payer: BC Managed Care – PPO

## 2022-07-10 DIAGNOSIS — M6281 Muscle weakness (generalized): Secondary | ICD-10-CM | POA: Diagnosis not present

## 2022-07-10 DIAGNOSIS — M5441 Lumbago with sciatica, right side: Secondary | ICD-10-CM

## 2022-07-10 DIAGNOSIS — R2689 Other abnormalities of gait and mobility: Secondary | ICD-10-CM | POA: Diagnosis not present

## 2022-07-11 ENCOUNTER — Encounter: Payer: Self-pay | Admitting: Family Medicine

## 2022-07-11 ENCOUNTER — Ambulatory Visit (INDEPENDENT_AMBULATORY_CARE_PROVIDER_SITE_OTHER): Payer: BC Managed Care – PPO | Admitting: Family Medicine

## 2022-07-11 VITALS — BP 130/80 | Ht 61.0 in | Wt 151.0 lb

## 2022-07-11 DIAGNOSIS — M5416 Radiculopathy, lumbar region: Secondary | ICD-10-CM

## 2022-07-11 NOTE — Patient Instructions (Signed)
Good to see you Please continue stretching.  You can place a hold on physical therapy  We'll get the MRI at [facility] imaging   Please send me a message in MyChart with any questions or updates.  We'll setup a virtual visit once the MRi is resulted.   --Dr. Jordan Likes

## 2022-07-11 NOTE — Progress Notes (Signed)
  Alison Vega - 41 y.o. female MRN [id number]  Date of birth: [birth year]  SUBJECTIVE:  Including CC & ROS.  No chief complaint on file.   Alison Vega is a 41 y.o. female that is presenting with ongoing low back and radicular pain.  She has been in physical therapy with limited improvement thus far.  She continues to have back spasms intermittently.    Review of Systems See HPI   HISTORY: Past Medical, Surgical, Social, and Family History Reviewed & Updated per EMR.   Pertinent Historical Findings include:  Past Medical History:  Diagnosis Date   Asthma    Cellulitis    cellulitis/abcess x15, "every 2 months"   Urticaria     Past Surgical History:  Procedure Laterality Date   CESAREAN SECTION     EYE SURGERY     x3   OPEN REDUCTION INTERNAL FIXATION (ORIF) METACARPAL Right 06/13/2019   Procedure: OPEN TREATMENT OF RIGHT 5TH METACARPAL FRACTURE;  Surgeon: Mack Hook, MD;  Location: [facility];  Service: Orthopedics;  Laterality: Right;   TUBAL LIGATION       PHYSICAL EXAM:  VS: BP 130/80 (BP Location: Left Arm, Patient Position: Sitting)   Ht 5\' 1"  (1.549 m)   Wt 151 lb (68.5 kg)   BMI 28.53 kg/m  Physical Exam Gen: NAD, alert, cooperative with exam, well-appearing MSK:  Neurovascularly intact       ASSESSMENT & PLAN:   Lumbar radiculopathy Pain is still acutely occurring.  She has been in physical therapy since 8/17.  Has been under greater than 6 weeks of physician directed home exercise program.  Imaging has been unrevealing.  She has tried medications.  Pain is still persisting. -Counseled on home exercise therapy and supportive care. -MRI of the lumbar spine to evaluate for nerve impingement and consideration of epidural use.

## 2022-07-11 NOTE — Assessment & Plan Note (Signed)
Pain is still acutely occurring.  She has been in physical therapy since 8/17.  Has been under greater than 6 weeks of physician directed home exercise program.  Imaging has been unrevealing.  She has tried medications.  Pain is still persisting. -Counseled on home exercise therapy and supportive care. -MRI of the lumbar spine to evaluate for nerve impingement and consideration of epidural use.

## 2022-07-15 ENCOUNTER — Ambulatory Visit: Payer: BC Managed Care – PPO | Admitting: Physical Therapy

## 2022-07-16 ENCOUNTER — Other Ambulatory Visit: Payer: Self-pay | Admitting: Family Medicine

## 2022-07-16 MED ORDER — HYDROCODONE-ACETAMINOPHEN 5-325 MG PO TABS: 1.0000 | ORAL_TABLET | Freq: Three times a day (TID) | ORAL | 0 refills | Status: DC | PRN

## 2022-07-16 NOTE — Progress Notes (Signed)
Provided norco for pain.   Myra Rude, MD [facility] 07/16/2022, 4:43 PM

## 2022-07-17 ENCOUNTER — Ambulatory Visit: Payer: BC Managed Care – PPO | Admitting: Physical Therapy

## 2022-07-22 ENCOUNTER — Ambulatory Visit
Admission: RE | Admit: 2022-07-22 | Discharge: 2022-07-22 | Disposition: A | Discharge status: Home / Self Care | Payer: BC Managed Care – PPO | Source: Ambulatory Visit | Attending: Family Medicine | Admitting: Family Medicine

## 2022-07-22 DIAGNOSIS — M545 Low back pain, unspecified: Secondary | ICD-10-CM | POA: Diagnosis not present

## 2022-07-22 DIAGNOSIS — M5416 Radiculopathy, lumbar region: Secondary | ICD-10-CM

## 2022-07-22 DIAGNOSIS — M79604 Pain in right leg: Secondary | ICD-10-CM | POA: Diagnosis not present

## 2022-07-25 ENCOUNTER — Encounter: Payer: Self-pay | Admitting: Family Medicine

## 2022-07-25 ENCOUNTER — Telehealth (INDEPENDENT_AMBULATORY_CARE_PROVIDER_SITE_OTHER): Payer: BC Managed Care – PPO | Admitting: Family Medicine

## 2022-07-25 DIAGNOSIS — M5416 Radiculopathy, lumbar region: Secondary | ICD-10-CM | POA: Diagnosis not present

## 2022-07-25 NOTE — Assessment & Plan Note (Signed)
Continues to have pain in a distribution of nerve impingement.  MRI did demonstrate different levels that could possibly be originating from. -Counseled on home exercise therapy and supportive care. -Pursue epidural.

## 2022-07-25 NOTE — Progress Notes (Signed)
Virtual Visit via Video Note  I connected with Alison Vega on 07/25/22 at  8:00 AM EDT by a video enabled telemedicine application and verified that I am speaking with the correct person using two identifiers.  Location: Patient: home Provider: office   I discussed the limitations of evaluation and management by telemedicine and the availability of in person appointments. The patient expressed understanding and agreed to proceed.  History of Present Illness:  Alison Vega is a 41 year old female that is following up after the MRI of her lumbar spine.  The MRI was demonstrating different levels of possible impingement on the right side.  Was not severe at any level.   Observations/Objective:   Assessment and Plan:  Lumbar radiculopathy: Continues to have pain in a distribution of nerve impingement.  MRI did demonstrate different levels that could possibly be originating from. -Counseled on home exercise therapy and supportive care. -Pursue epidural.  Follow Up Instructions:    I discussed the assessment and treatment plan with the patient. The patient was provided an opportunity to ask questions and all were answered. The patient agreed with the plan and demonstrated an understanding of the instructions.   The patient was advised to call back or seek an in-person evaluation if the symptoms worsen or if the condition fails to improve as anticipated.    Clearance Coots, MD

## 2022-07-30 ENCOUNTER — Encounter (HOSPITAL_COMMUNITY): Payer: Self-pay

## 2022-07-30 ENCOUNTER — Ambulatory Visit (HOSPITAL_COMMUNITY)
Admission: EM | Admit: 2022-07-30 | Discharge: 2022-07-30 | Disposition: A | Discharge status: Home / Self Care | Payer: BC Managed Care – PPO | Attending: Emergency Medicine | Admitting: Emergency Medicine

## 2022-07-30 DIAGNOSIS — G8929 Other chronic pain: Secondary | ICD-10-CM

## 2022-07-30 DIAGNOSIS — S39012A Strain of muscle, fascia and tendon of lower back, initial encounter: Secondary | ICD-10-CM

## 2022-07-30 DIAGNOSIS — M545 Low back pain, unspecified: Secondary | ICD-10-CM

## 2022-07-30 MED ORDER — LIDOCAINE 5 % EX PTCH: 1.0000 | MEDICATED_PATCH | CUTANEOUS | 0 refills | Status: DC

## 2022-07-30 MED ORDER — IBUPROFEN 800 MG PO TABS: 800.0000 mg | ORAL_TABLET | Freq: Four times a day (QID) | ORAL | 0 refills | Status: DC | PRN

## 2022-07-30 MED ORDER — KETOROLAC TROMETHAMINE 30 MG/ML IJ SOLN
INTRAMUSCULAR | Status: AC
  Filled 2022-07-30: qty 1

## 2022-07-30 MED ORDER — KETOROLAC TROMETHAMINE 30 MG/ML IJ SOLN
30.0000 mg | Freq: Once | INTRAMUSCULAR | Status: AC
Start: 2022-07-30 — End: 2022-07-30
  Administered 2022-07-30: 30 mg via INTRAMUSCULAR

## 2022-07-30 NOTE — ED Triage Notes (Signed)
Pt states she woke up today with severe back pain which is a ongoing issue.

## 2022-07-30 NOTE — Discharge Instructions (Addendum)
You can take the ibuprofen every 6 hours as needed. Also can apply the lidocaine patches for 12 hours at a time.  Please follow up with your specialists.

## 2022-07-30 NOTE — ED Provider Notes (Signed)
[facility]    CSN: [id number] Arrival date & time: 07/30/22  1544     History   Chief Complaint Chief Complaint  Patient presents with   Back Pain    HPI Alison Vega is a 41 y.o. female.  Presents with low back pain, woke up this morning with 10/10 pain History of same, has seen sports med and has been in physical therapy for over 6 weeks.  Most recently was given Norco, had lumbar MRI last week. She is scheduled to have epidural in 2 days  Has not taken any medications today Denies any radiating into the legs No weakness, numbness or tingling.  No bowel or bladder dysfunction  Past Medical History:  Diagnosis Date   Asthma    Cellulitis    cellulitis/abcess x15, "every 2 months"   Urticaria     Patient Active Problem List   Diagnosis Date Noted   Lumbar radiculopathy 05/21/2022   Visit for routine gyn exam 01/24/2022   Vitamin D deficiency 12/17/2021   Recurrent major depression in remission (HCC) 12/17/2021   Stress incontinence (female) (female) 12/17/2021   History of abnormal cervical Pap smear 12/17/2021   Primary insomnia 11/01/2021   Asthmatic bronchitis 11/01/2021   Moderate persistent asthma without complication 10/01/2021   Chronic idiopathic urticaria 05/30/2021   Nonallergic rhinitis 09/13/2019    Past Surgical History:  Procedure Laterality Date   CESAREAN SECTION     EYE SURGERY     x3   OPEN REDUCTION INTERNAL FIXATION (ORIF) METACARPAL Right 06/13/2019   Procedure: OPEN TREATMENT OF RIGHT 5TH METACARPAL FRACTURE;  Surgeon: Mack Hook, MD;  Location: [facility];  Service: Orthopedics;  Laterality: Right;   TUBAL LIGATION      OB History     Gravida  3   Para  3   Term  3   Preterm      AB      Living  3      SAB      IAB      Ectopic      Multiple      Live Births  3            Home Medications    Prior to Admission medications   Medication Sig Start Date End Date Taking?  Authorizing Provider  ibuprofen (ADVIL) 800 MG tablet Take 1 tablet (800 mg total) by mouth every 6 (six) hours as needed for mild pain or moderate pain. 07/30/22  Yes Sekai Nayak, PA-C  lidocaine (LIDODERM) 5 % Place 1 patch onto the skin daily. Remove & Discard patch within 12 hours 07/30/22  Yes Keiffer Piper, Lurena Joiner, PA-C  Albuterol Sulfate, sensor, (PROAIR DIGIHALER) 108 (90 Base) MCG/ACT AEPB Inhale 2 puffs into the lungs as needed (coughing, wheezing, shortness of breath). 10/01/21   Ellamae Sia, DO  fluticasone (FLONASE) 50 MCG/ACT nasal spray Place 1-2 sprays into both nostrils daily as needed for rhinitis. 05/30/21   Ellamae Sia, DO  Fluticasone-Umeclidin-Vilant (TRELEGY ELLIPTA) 200-62.5-25 MCG/ACT AEPB Inhale 1 puff into the lungs daily. Rinse mouth after each use. 06/03/22   Ellamae Sia, DO  gabapentin (NEURONTIN) 300 MG capsule Take 1 capsule (300 mg total) by mouth 3 (three) times daily. 06/11/22   Myra Rude, MD  hydrOXYzine (VISTARIL) 50 MG capsule Take 1-2 capsules (50-100 mg total) by mouth at bedtime as needed. 12/17/21   Kuneff, Renee A, DO  levocetirizine (XYZAL) 5 MG tablet TAKE 1 TABLET  BY MOUTH TWICE DAILY AS NEEDED FOR ALLERGIES (HIVES). 10/01/21   Garnet Sierras, DO  solifenacin (VESICARE) 5 MG tablet Take 1 tablet (5 mg total) by mouth daily. 04/02/22   Chancy Milroy, MD  Vitamin D, Ergocalciferol, (DRISDOL) 1.25 MG (50000 UNIT) CAPS capsule Take 1 capsule (50,000 Units total) by mouth every 7 (seven) days. 12/19/21   Ma Hillock, DO    Family History Family History  Problem Relation Age of Onset   Cancer Father        unknown type   Lung cancer Father    Diabetes Mother    Asthma Mother    Hepatitis C Mother    Asthma Son     Social History Social History   Tobacco Use   Smoking status: Never    Passive exposure: Never   Smokeless tobacco: Never  Vaping Use   Vaping Use: Never used  Substance Use Topics   Alcohol use: Yes    Comment: rarely   Drug use:  No     Allergies   Penicillins, Sulfa antibiotics, and Avocado   Review of Systems Review of Systems  Musculoskeletal:  Positive for back pain.   Per HPI  Physical Exam Triage Vital Signs ED Triage Vitals  Enc Vitals Group     BP 07/30/22 1633 131/89     Pulse Rate 07/30/22 1633 81     Resp 07/30/22 1633 12     Temp 07/30/22 1633 98.8 F (37.1 C)     Temp Source 07/30/22 1633 Oral     SpO2 07/30/22 1633 98 %     Weight 07/30/22 1632 151 lb (68.5 kg)     Height 07/30/22 1632 5\' 1"  (1.549 m)     Head Circumference --      Peak Flow --      Pain Score 07/30/22 1632 10     Pain Loc --      Pain Edu? --      Excl. in [place]? --    No data found.  Updated Vital Signs BP 131/89 (BP Location: Left Arm)   Pulse 81   Temp 98.8 F (37.1 C) (Oral)   Resp 12   Ht 5\' 1"  (1.549 m)   Wt 151 lb (68.5 kg)   LMP  (LMP Unknown)   SpO2 98%   BMI 28.53 kg/m   Physical Exam Vitals and nursing note reviewed.  Constitutional:      General: She is not in acute distress. HENT:     Mouth/Throat:     Pharynx: Oropharynx is clear.  Eyes:     Conjunctiva/sclera: Conjunctivae normal.  Cardiovascular:     Rate and Rhythm: Normal rate and regular rhythm.     Pulses: Normal pulses.     Heart sounds: Normal heart sounds.  Pulmonary:     Effort: Pulmonary effort is normal.     Breath sounds: Normal breath sounds.  Musculoskeletal:        General: Tenderness present. Normal range of motion.     Cervical back: Normal range of motion.       Back:     Comments: Lumbar paraspinals somewhat tender, no bony tenderness, full ROM after medication given.  Strength 5/5 lower extremities  Skin:    General: Skin is warm and dry.  Neurological:     General: No focal deficit present.     Mental Status: She is alert and oriented to person, place, and time.  UC Treatments / Results  Labs (all labs ordered are listed, but only abnormal results are displayed) Labs Reviewed - No data to  display  EKG  Radiology No results found.  Procedures Procedures (including critical care time)  Medications Ordered in UC Medications  ketorolac (TORADOL) 30 MG/ML injection 30 mg (30 mg Intramuscular Given 07/30/22 1642)    Initial Impression / Assessment and Plan / UC Course  I have reviewed the triage vital signs and the nursing notes.  Pertinent labs & imaging results that were available during my care of the patient were reviewed by me and considered in my medical decision making (see chart for details).  Toradol injection given, patient reports pain went from 10 to a 5. Better ROM and appears more comfortable on exam table. Discussed use of 800 mg ibuprofen 3 times daily as needed in addition with lidocaine patches.  Patient reports lidocaine patches have worked very well for her in the past.  Discussed following up with her specialists, return precautions for any worsening symptoms in the meantime.  Patient agrees to plan  Final Clinical Impressions(s) / UC Diagnoses   Final diagnoses:  Strain of lumbar region, initial encounter  Chronic bilateral low back pain without sciatica     Discharge Instructions      You can take the ibuprofen every 6 hours as needed. Also can apply the lidocaine patches for 12 hours at a time.  Please follow up with your specialists.     ED Prescriptions     Medication Sig Dispense Auth. Provider   ibuprofen (ADVIL) 800 MG tablet Take 1 tablet (800 mg total) by mouth every 6 (six) hours as needed for mild pain or moderate pain. 21 tablet Damione Robideau, PA-C   lidocaine (LIDODERM) 5 % Place 1 patch onto the skin daily. Remove & Discard patch within 12 hours 30 patch Kalob Bergen, Lurena Joiner, PA-C      PDMP not reviewed this encounter.   Kinsie Belford, Ray Church 07/30/22 1759

## 2022-08-01 ENCOUNTER — Ambulatory Visit
Admission: RE | Admit: 2022-08-01 | Discharge: 2022-08-01 | Disposition: A | Discharge status: Home / Self Care | Payer: BC Managed Care – PPO | Source: Ambulatory Visit | Attending: Family Medicine | Admitting: Family Medicine

## 2022-08-01 DIAGNOSIS — M5416 Radiculopathy, lumbar region: Secondary | ICD-10-CM

## 2022-08-01 MED ORDER — METHYLPREDNISOLONE ACETATE 40 MG/ML INJ SUSP (RADIOLOG
80.0000 mg | Freq: Once | INTRAMUSCULAR | Status: AC
  Administered 2022-08-01: 80 mg via EPIDURAL

## 2022-08-01 MED ORDER — IOPAMIDOL (ISOVUE-M 200) INJECTION 41%
1.0000 mL | Freq: Once | INTRAMUSCULAR | Status: AC
  Administered 2022-08-01: 1 mL via EPIDURAL

## 2022-08-01 NOTE — Discharge Instructions (Signed)

## 2022-08-07 ENCOUNTER — Telehealth: Payer: Self-pay

## 2022-08-07 NOTE — Telephone Encounter (Signed)
Patient called and left a message wanting Erline Levine to call her back. Did not say what she would like to discuss.

## 2022-08-08 NOTE — Telephone Encounter (Signed)
Patient has an appointment Monday.

## 2022-08-11 ENCOUNTER — Ambulatory Visit (INDEPENDENT_AMBULATORY_CARE_PROVIDER_SITE_OTHER): Payer: BC Managed Care – PPO | Admitting: Family Medicine

## 2022-08-11 ENCOUNTER — Ambulatory Visit: Payer: Self-pay

## 2022-08-11 ENCOUNTER — Encounter: Payer: Self-pay | Admitting: Family Medicine

## 2022-08-11 VITALS — BP 102/70 | Ht 61.0 in | Wt 151.0 lb

## 2022-08-11 DIAGNOSIS — G5701 Lesion of sciatic nerve, right lower limb: Secondary | ICD-10-CM | POA: Diagnosis not present

## 2022-08-11 DIAGNOSIS — M533 Sacrococcygeal disorders, not elsewhere classified: Secondary | ICD-10-CM

## 2022-08-11 MED ORDER — HYDROCODONE-ACETAMINOPHEN 5-325 MG PO TABS: 1.0000 | ORAL_TABLET | Freq: Three times a day (TID) | ORAL | 0 refills | Status: DC | PRN

## 2022-08-11 MED ORDER — TRIAMCINOLONE ACETONIDE 40 MG/ML IJ SUSP
40.0000 mg | Freq: Once | INTRAMUSCULAR | Status: AC
  Administered 2022-08-11: 40 mg via INTRA_ARTICULAR

## 2022-08-11 NOTE — Assessment & Plan Note (Signed)
Acutely worsening.  No improvement with lumbar epidural.  Does have pain over the right SI joint. -Counseled on home exercise therapy and supportive care. -SI joint injection. -Could consider pelvis MRI.

## 2022-08-11 NOTE — Patient Instructions (Signed)
Good to see you  Please use heat  We will pursue the nerve study  Please send me a message in MyChart with any questions or updates.  We will setup a virtual visit once the nerve study is completed. .   --Dr. Raeford Razor

## 2022-08-11 NOTE — Progress Notes (Signed)
  Alison Vega - 41 y.o. female MRN [id number]  Date of birth: 28-Jun-1981  SUBJECTIVE:  Including CC & ROS.  No chief complaint on file.   Alison Vega is a 41 y.o. female that is presenting with acute worsening of her right leg pain.  She received an epidural in the lumbar spine but did not receive any relief.  She continues to have altered sensation in the right leg as well has alternating episodes coolness and warmth.  Has weakness in the right leg.    Review of Systems See HPI   HISTORY: Past Medical, Surgical, Social, and Family History Reviewed & Updated per EMR.   Pertinent Historical Findings include:  Past Medical History:  Diagnosis Date   Asthma    Cellulitis    cellulitis/abcess x15, "every 2 months"   Urticaria     Past Surgical History:  Procedure Laterality Date   CESAREAN SECTION     EYE SURGERY     x3   OPEN REDUCTION INTERNAL FIXATION (ORIF) METACARPAL Right 06/13/2019   Procedure: OPEN TREATMENT OF RIGHT 5TH METACARPAL FRACTURE;  Surgeon: Milly Jakob, MD;  Location: [facility];  Service: Orthopedics;  Laterality: Right;   TUBAL LIGATION       PHYSICAL EXAM:  VS: BP 102/70 (BP Location: Left Arm, Patient Position: Sitting)   Ht 5\' 1"  (1.549 m)   Wt 151 lb (68.5 kg)   LMP  (LMP Unknown)   BMI 28.53 kg/m  Physical Exam Gen: NAD, alert, cooperative with exam, well-appearing MSK:  Neurovascularly intact     Aspiration/Injection Procedure Note Alison Vega [birth year]  Procedure: Injection Indications: Right SI joint pain  Procedure Details Consent: Risks of procedure as well as the alternatives and risks of each were explained to the (patient/caregiver).  Consent for procedure obtained. Time Out: Verified patient identification, verified procedure, site/side was marked, verified correct patient position, special equipment/implants available, medications/allergies/relevent history reviewed, required imaging and test results  available.  Performed.  The area was cleaned with iodine and alcohol swabs.    The right SI joint was injected using 3 cc of 1% lidocaine on a 3-1/2 inch needle.  The syringe was switched to mixture containing 1 cc's of 40 mg Kenalog and 4 cc's of 0.25% bupivacaine was injected.  Ultrasound was used. Images were obtained in long views showing the injection.     A sterile dressing was applied.  Patient did tolerate procedure well.     ASSESSMENT & PLAN:   Piriformis syndrome of right side Acutely worsening.  No improvement with lumbar epidural.  MRI of the lumbar spine was reassuring.  Continues to have pain on the right leg with episodes of temperature sensational changes. -Counseled on home exercise therapy and supportive care. -Pursue nerve study of the right leg  Sacroiliac joint dysfunction of right side Acutely worsening.  No improvement with lumbar epidural.  Does have pain over the right SI joint. -Counseled on home exercise therapy and supportive care. -SI joint injection. -Could consider pelvis MRI.

## 2022-08-11 NOTE — Assessment & Plan Note (Signed)
Acutely worsening.  No improvement with lumbar epidural.  MRI of the lumbar spine was reassuring.  Continues to have pain on the right leg with episodes of temperature sensational changes. -Counseled on home exercise therapy and supportive care. -Pursue nerve study of the right leg

## 2022-08-26 ENCOUNTER — Encounter: Payer: Self-pay | Admitting: Allergy

## 2022-08-26 ENCOUNTER — Ambulatory Visit (INDEPENDENT_AMBULATORY_CARE_PROVIDER_SITE_OTHER): Payer: BC Managed Care – PPO | Admitting: Allergy

## 2022-08-26 VITALS — BP 120/88 | HR 107 | Temp 99.0°F | Resp 16

## 2022-08-26 DIAGNOSIS — L501 Idiopathic urticaria: Secondary | ICD-10-CM

## 2022-08-26 DIAGNOSIS — J310 Chronic rhinitis: Secondary | ICD-10-CM

## 2022-08-26 DIAGNOSIS — J4540 Moderate persistent asthma, uncomplicated: Secondary | ICD-10-CM

## 2022-08-26 MED ORDER — LEVOCETIRIZINE DIHYDROCHLORIDE 5 MG PO TABS
5.0000 mg | ORAL_TABLET | Freq: Two times a day (BID) | ORAL | 5 refills | Status: DC
Start: 2022-08-26 — End: 2023-05-19

## 2022-08-26 MED ORDER — METHYLPREDNISOLONE ACETATE 80 MG/ML IJ SUSP: 80.0000 mg | Freq: Once | INTRAMUSCULAR | Status: DC

## 2022-08-26 MED ORDER — FAMOTIDINE 40 MG PO TABS: ORAL_TABLET | ORAL | 5 refills | Status: DC

## 2022-08-26 NOTE — Progress Notes (Signed)
Follow Up Note  RE: Kinlee Garrison MRN: [id number] DOB: 07-02-1981 Date of Office Visit: 08/26/2022  Referring provider: Ma Hillock, DO Primary care provider: Ma Hillock, DO  Chief Complaint: Urticaria (Hive breakout started about a week ago. It is around her thigh area, back area,arms. She says it happens when she is on short term disability. ), Asthma (Seems to be okay. ), and Angioedema (Her eyes started swelling )  History of Present Illness: I had the pleasure of seeing [facility] for a follow up visit at the [facility] on 08/26/2022. She is a 41 y.o. female, who is being followed for chronic idiopathic urticaria, asthma, nonallergic rhinitis. Her previous allergy office visit was on 06/03/2022 with Dr. Maudie Mercury. Today is a regular follow up visit.  Chronic idiopathic urticaria Started to break out in hives last week which is worsening the past 3-4 days. Currently on short term disability since August due to her back and leg issues.   She has been more stressed lately due to financial reasons and unable to pay for some of her meds.  She ran out of Xyzal 2 days ago and can't afford to pay for it until her next paycheck.  This outbreak was worse than the ones she had while on Xolair.    Moderate persistent asthma Currently on Trelegy 244mg 1 puff once a day which seems to work better than the Airduo.  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   Nonallergic rhinitis Stable.   Assessment and Plan: [place] a 41y.o. female with: Chronic idiopathic urticaria Past history - Lab evaluation unremarkable (FCeRI antibody, anti-thyroglobulin antibody, thyroid peroxidase antibody, tryptase, H. pylori serology, CBC, CMP, ESR, ANA, and alpha-gal panel). On Xolair in the past.  Interim history -  Major flare this past week. Increased stress due to financial issues and ran out of Xyzal.  Denies any other changes.  Steroid injection given today - Depo 827m Start prednisone taper.  Take the following medications:  Xyzal (levocetirizine) 66m21mwice a day - [place] insurance does not cover it. Pepcid (famotidine) 83m47mice a day (take 1/2 tablet of the 40mg866mlet twice a day) Avoid the following potential triggers: alcohol, tight clothing, NSAIDs, hot showers and getting overheated. If no improvement, will add Xolair back on.  Moderate persistent asthma without complication Past history - normal CXR in 2021. Symbicort and Breo too expensive. Prednisone in December 2022.  Interim history - better with Trelegy and no albuterol use but has not been working as out on short term disability.  Today's spirometry was normal.  Daily controller medication(s): Trelegy 200mcg31muff once a day and rinse mouth after each. Samples given.  May use albuterol rescue inhaler 2-4 puffs every 4 to 6 hours as needed for shortness of breath, chest tightness, coughing, and wheezing. May use albuterol rescue inhaler 2 puffs 5 to 15 minutes prior to strenuous physical activities. Monitor frequency of use.  Get spirometry at next visit.  Nonallergic rhinitis Past history - 2020 skin testing negative to pediatric environmental panel.  Interim history - stable. Use Flonase (fluticasone) nasal spray 1 spray per nostril twice a day as needed for nasal congestion.  Nasal saline spray (i.e., Simply Saline) or nasal saline lavage (i.e., NeilMed) is recommended as needed and prior to medicated nasal sprays. May take Xyzal (levocetirizine)  50m 1-2 times daily as needed.   Return in about 4 weeks (around 09/23/2022).  Meds ordered this encounter  Medications   levocetirizine (XYZAL) 5 MG tablet    Sig: Take 1 tablet (5 mg total) by mouth in the morning and at bedtime.    Dispense:  60 tablet    Refill:  5   famotidine (PEPCID) 40 MG tablet    Sig: Take 1/2 tablet twice a  day.    Dispense:  30 tablet    Refill:  5   methylPREDNISolone acetate (DEPO-MEDROL) injection 80 mg   Lab Orders  No laboratory test(s) ordered today    Diagnostics: Spirometry:  Tracings reviewed. Her effort: Good reproducible efforts. FVC: 2.98L FEV1: 2.63L, 92% predicted FEV1/FVC ratio: 88% Interpretation: Spirometry consistent with normal pattern.  Please see scanned spirometry results for details.  Medication List:  Current Outpatient Medications  Medication Sig Dispense Refill   famotidine (PEPCID) 40 MG tablet Take 1/2 tablet twice a day. 30 tablet 5   fluticasone (FLONASE) 50 MCG/ACT nasal spray Place 1-2 sprays into both nostrils daily as needed for rhinitis. 16 g 5   Fluticasone-Umeclidin-Vilant (TRELEGY ELLIPTA) 200-62.5-25 MCG/ACT AEPB Inhale 1 puff into the lungs daily. Rinse mouth after each use. 60 each 5   HYDROcodone-acetaminophen (NORCO/VICODIN) 5-325 MG tablet Take 1 tablet by mouth every 8 (eight) hours as needed. 15 tablet 0   hydrOXYzine (VISTARIL) 50 MG capsule Take 1-2 capsules (50-100 mg total) by mouth at bedtime as needed. 180 capsule 3   ibuprofen (ADVIL) 800 MG tablet Take 1 tablet (800 mg total) by mouth every 6 (six) hours as needed for mild pain or moderate pain. 21 tablet 0   levocetirizine (XYZAL) 5 MG tablet Take 1 tablet (5 mg total) by mouth in the morning and at bedtime. 60 tablet 5   lidocaine (LIDODERM) 5 % Place 1 patch onto the skin daily. Remove & Discard patch within 12 hours 30 patch 0   solifenacin (VESICARE) 5 MG tablet Take 1 tablet (5 mg total) by mouth daily. 30 tablet 5   Vitamin D, Ergocalciferol, (DRISDOL) 1.25 MG (50000 UNIT) CAPS capsule Take 1 capsule (50,000 Units total) by mouth every 7 (seven) days. 12 capsule 4   Albuterol Sulfate, sensor, (PROAIR DIGIHALER) 108 (90 Base) MCG/ACT AEPB Inhale 2 puffs into the lungs as needed (coughing, wheezing, shortness of breath). (Patient not taking: Reported on 08/26/2022) 1 each 2    gabapentin (NEURONTIN) 300 MG capsule Take 1 capsule (300 mg total) by mouth 3 (three) times daily. (Patient not taking: Reported on 08/26/2022) 90 capsule 1   Current Facility-Administered Medications  Medication Dose Route Frequency Provider Last Rate Last Admin   methylPREDNISolone acetate (DEPO-MEDROL) injection 80 mg  80 mg Intramuscular Once KGarnet Sierras DO       Allergies: Allergies  Allergen Reactions   Penicillins Shortness Of Breath and Other (See Comments)    Shut patients throat.   Sulfa Antibiotics Shortness Of Breath and Other (See Comments)    Shuts patient's throat.   Avocado Hives   I reviewed her past medical history, social history, family history, and environmental history and no significant changes have been reported from her previous visit.  Review of Systems  Constitutional:  Negative for appetite change, chills, fever and unexpected weight change.  HENT:  Negative for congestion and rhinorrhea.   Eyes:  Negative for itching.  Respiratory:  Negative for cough, chest tightness, shortness of breath and wheezing.  Gastrointestinal:  Negative for abdominal pain.  Skin:  Positive for rash.  Allergic/Immunologic: Negative for environmental allergies and food allergies.  Neurological:  Negative for headaches.    Objective: BP 120/88   Pulse (!) 107   Temp 99 F (37.2 C)   Resp 16   LMP  (LMP Unknown)   SpO2 97%  There is no height or weight on file to calculate BMI. Physical Exam Vitals and nursing note reviewed.  Constitutional:      Appearance: Normal appearance. She is well-developed.  HENT:     Head: Normocephalic and atraumatic.     Right Ear: Tympanic membrane and external ear normal.     Left Ear: Tympanic membrane and external ear normal.     Nose: Nose normal.     Mouth/Throat:     Mouth: Mucous membranes are moist.     Pharynx: Oropharynx is clear.  Eyes:     Conjunctiva/sclera: Conjunctivae normal.  Cardiovascular:     Rate and Rhythm:  Normal rate and regular rhythm.     Heart sounds: Normal heart sounds. No murmur heard.    No friction rub. No gallop.  Pulmonary:     Effort: Pulmonary effort is normal.     Breath sounds: Normal breath sounds. No wheezing or rales.  Musculoskeletal:     Cervical back: Neck supple.  Skin:    General: Skin is warm.     Findings: Rash present.     Comments: Diffuse hives on torso, upper thighs b/l.  Neurological:     Mental Status: She is alert and oriented to person, place, and time.  Psychiatric:        Behavior: Behavior normal.   Previous notes and tests were reviewed. The plan was reviewed with the patient/family, and all questions/concerned were addressed.  It was my pleasure to see [facility] today and participate in her care. Please feel free to contact me with any questions or concerns.  Sincerely,  Rexene Alberts, DO Allergy & Immunology  [facility] office: [place] office: [phone]

## 2022-08-26 NOTE — Assessment & Plan Note (Signed)
Past history - normal CXR in 2021. Symbicort and Breo too expensive. Prednisone in December 2022.  Interim history - better with Trelegy and no albuterol use but has not been working as out on short term disability.  . Today's spirometry was normal.  . Daily controller medication(s): Trelegy 231mcg 1 puff once a day and rinse mouth after each. Samples given.  . May use albuterol rescue inhaler 2-4 puffs every 4 to 6 hours as needed for shortness of breath, chest tightness, coughing, and wheezing. May use albuterol rescue inhaler 2 puffs 5 to 15 minutes prior to strenuous physical activities. Monitor frequency of use.  . Get spirometry at next visit.

## 2022-08-26 NOTE — Assessment & Plan Note (Signed)
Past history - Lab evaluation unremarkable (FCeRI antibody, anti-thyroglobulin antibody, thyroid peroxidase antibody, tryptase, H. pylori serology, CBC, CMP, ESR, ANA, and alpha-gal panel). On Xolair in the past.  Interim history -  Major flare this past week. Increased stress due to financial issues and ran out of Xyzal. Denies any other changes.   Steroid injection given today - Depo 33m.  Start prednisone taper.   Take the following medications:   Xyzal (levocetirizine) 512mtwice a day - [place] the [place] not [place] it.  Pepcid (famotidine) 2033mwice a day (take 1/2 tablet of the 67m53mblet twice a day) . Avoid the following potential triggers: alcohol, tight clothing, NSAIDs, hot showers and getting overheated. . If no improvement, will add Xolair back on.

## 2022-08-26 NOTE — Patient Instructions (Addendum)
Urticaria Steroid injection given today - Depo 80mg . Prednisone 10mg  tablet pack: 2 tablets given in office today. Take 2 more tablets before bed today.  Then take 2 tablets twice a day for 2 more days. Then take 2 tablets once a day for 1 day. Then take 1 tablet once a day for 1 day.   Take the following medications:  Xyzal (levocetirizine) 5mg  twice a day - get the generic version from [place] if insurance does not cover it. Pepcid (famotidine) 20mg  twice a day (take 1/2 tablet of the 40mg  tablet twice a day) Avoid the following potential triggers: alcohol, tight clothing, NSAIDs, hot showers and getting overheated.  Asthma  Daily controller medication(s): Trelegy 271mcg 1 puff once a day and rinse mouth after each. Samples given.  May use albuterol rescue inhaler 2-4 puffs every 4 to 6 hours as needed for shortness of breath, chest tightness, coughing, and wheezing. May use albuterol rescue inhaler 2 puffs 5 to 15 minutes prior to strenuous physical activities. Monitor frequency of use.  Asthma control goals:  Full participation in all desired activities (may need albuterol before activity) Albuterol use two times or less a week on average (not counting use with activity) Cough interfering with sleep two times or less a month Oral steroids no more than once a year No hospitalizations  Chronic rhinitis Use Flonase (fluticasone) nasal spray 1 spray per nostril twice a day as needed for nasal congestion.  Nasal saline spray (i.e., Simply Saline) or nasal saline lavage (i.e., NeilMed) is recommended as needed and prior to medicated nasal sprays. May take Xyzal (levocetirizine) 5mg  1-2 times daily as needed.    Follow up in 1 months or sooner if needed.  If you run out of your medications before the next visit and you can't afford it then call our office so we can get you samples if we have them.

## 2022-08-26 NOTE — Assessment & Plan Note (Signed)
Past history - 2020 skin testing negative to pediatric environmental panel.  Interim history - stable.  Use Flonase (fluticasone) nasal spray 1 spray per nostril twice a day as needed for nasal congestion.   Nasal saline spray (i.e., Simply Saline) or nasal saline lavage (i.e., NeilMed) is recommended as needed and prior to medicated nasal sprays.  May take Xyzal (levocetirizine) 5mg  1-2 times daily as needed.

## 2022-09-01 ENCOUNTER — Other Ambulatory Visit: Payer: Self-pay | Admitting: Family Medicine

## 2022-09-01 MED ORDER — HYDROCODONE-ACETAMINOPHEN 5-325 MG PO TABS: 1.0000 | ORAL_TABLET | Freq: Four times a day (QID) | ORAL | 0 refills | Status: DC | PRN

## 2022-09-01 NOTE — Progress Notes (Signed)
Patient messaged about continued pain.  Being evaluated for continued nerve pain in leg, plan for NCV/EMG right leg.  Will rx short course of hydrocodone.  Database reviewed.

## 2022-09-09 DIAGNOSIS — M79604 Pain in right leg: Secondary | ICD-10-CM | POA: Diagnosis not present

## 2022-09-09 DIAGNOSIS — R202 Paresthesia of skin: Secondary | ICD-10-CM | POA: Diagnosis not present

## 2022-09-10 ENCOUNTER — Telehealth: Payer: Self-pay

## 2022-09-10 ENCOUNTER — Other Ambulatory Visit: Payer: Self-pay | Admitting: *Deleted

## 2022-09-10 NOTE — Telephone Encounter (Signed)
Pt scheduled for appt  [facility] Day - Client TELEPHONE ADVICE RECORD [facility] Patient Name: Alison Vega Gender: Female DOB: [birth year] Age: 41 Y 2 M 20 D Return Phone Number: [phone] (Primary) Address: City/ State/ Zip: [place]  [address] Client [facility] Day - Client Client Site [facility] - Day Provider [place], [place] Contact Type Call Who Is Calling Patient / Member / Family / Caregiver Call Type Triage / Clinical Relationship To Patient Self Return Phone Number [phone] (Primary) Chief Complaint Hives Reason for Call Symptomatic / Request for Health Information Initial Comment Caller needs to schedule an appt as she is having hives. Patient has also been on her period for 2 weeks. Additional Comment I called the backline number as I was advised in the scripting but they did not answer. Translation No Nurse Assessment Nurse: Humfleet, RN, Marchelle Folks Date/Time (Eastern Time): 09/10/2022 12:01:35 PM Confirm and document reason for call. If symptomatic, describe symptoms. ---caller states she has had hives that come and go. her chin is swollen and hurts . has been on period for 2 weeks. Does the patient have any new or worsening symptoms? ---Yes Will a triage be completed? ---Yes Related visit to physician within the last 2 weeks? ---Yes Does the PT have any chronic conditions? (i.e. diabetes, asthma, this includes High risk factors for pregnancy, etc.) ---Yes List chronic conditions. ---asthma Is the patient pregnant or possibly pregnant? (Ask all females between the ages of 38-55) ---No Is this a behavioral health or substance abuse call? ---No Guidelines Guideline Title Affirmed Question Affirmed Notes Nurse Date/Time (Eastern Time) Hives [1] MODERATESEVERE hives persist (i.e., hives interfere with normal activities or work) AND [2] taking Humfleet, RN, Marchelle Folks 09/10/2022  12:04:55 PM PLEASE NOTE: All timestamps contained within this report are represented as [place] Standard Time. CONFIDENTIALTY NOTICE: This fax transmission is intended only for the addressee. It contains information that is legally privileged, confidential or otherwise protected from use or disclosure. If you are not the intended recipient, you are strictly prohibited from reviewing, disclosing, copying using or disseminating any of this information or taking any action in reliance on or regarding this information. If you have received this fax in error, please notify us immediately by telephone so that we can arrange for its return to [place]. Phone: [phone], Toll-Free: [phone], Fax: [phone] Page: 2 of 2 Call Id: [id number] Guidelines Guideline Title Affirmed Question Affirmed Notes Nurse Date/Time Lamount Cohen Time) antihistamine (e.g., Benadryl, Claritin) > 24 hours Disp. Time Lamount Cohen Time) Disposition Final User 09/10/2022 12:07:10 PM See PCP within 24 Hours Yes Humfleet, RN, Marchelle Folks Final Disposition 09/10/2022 12:07:10 PM See PCP within 24 Hours Yes Humfleet, RN, Earnestine Leys Disagree/Comply Comply Caller Understands Yes PreDisposition Did not know what to do Care Advice Given Per Guideline SEE PCP WITHIN 24 HOURS: * IF OFFICE WILL BE OPEN: You need to be examined within the next 24 hours. Call your doctor (or NP/PA) when the office opens and make an appointment. CARE ADVICE given per Hives (Adult) guideline. * You become worse CALL BACK IF: Referrals REFERRED TO PCP OFFICE

## 2022-09-11 ENCOUNTER — Telehealth: Payer: Self-pay

## 2022-09-11 ENCOUNTER — Encounter: Payer: Self-pay | Admitting: Family Medicine

## 2022-09-11 ENCOUNTER — Ambulatory Visit (INDEPENDENT_AMBULATORY_CARE_PROVIDER_SITE_OTHER): Payer: BC Managed Care – PPO | Admitting: Family Medicine

## 2022-09-11 VITALS — BP 125/77 | HR 76 | Temp 98.2°F | Wt 147.6 lb

## 2022-09-11 DIAGNOSIS — F319 Bipolar disorder, unspecified: Secondary | ICD-10-CM | POA: Insufficient documentation

## 2022-09-11 DIAGNOSIS — L501 Idiopathic urticaria: Secondary | ICD-10-CM | POA: Diagnosis not present

## 2022-09-11 DIAGNOSIS — F3170 Bipolar disorder, currently in remission, most recent episode unspecified: Secondary | ICD-10-CM | POA: Diagnosis not present

## 2022-09-11 MED ORDER — DOXEPIN HCL 50 MG PO CAPS: 50.0000 mg | ORAL_CAPSULE | Freq: Every day | ORAL | 5 refills | Status: DC

## 2022-09-11 MED ORDER — METHYLPREDNISOLONE ACETATE 80 MG/ML IJ SUSP
80.0000 mg | Freq: Once | INTRAMUSCULAR | Status: AC
  Administered 2022-09-11: 80 mg via INTRAMUSCULAR

## 2022-09-11 MED ORDER — RISPERIDONE 0.25 MG PO TBDP
1.0000 | ORAL_TABLET | Freq: Every day | ORAL | 5 refills | Status: DC
Start: 2022-09-11 — End: 2023-06-02

## 2022-09-11 NOTE — Telephone Encounter (Signed)
[facility] does not have meds in stock.  Patient does not know what to do.  She does not have a car so she relies on transportation assistance.  Please advise [phone]

## 2022-09-11 NOTE — Progress Notes (Signed)
Alison Vega , 12/09/1980, 41 y.o., female MRN: [id number] Patient Care Team    Relationship Specialty Notifications Start End  Ma Hillock, DO PCP - General Family Medicine  10/01/21   Alison Sierras, DO Consulting Physician Allergy  11/01/21     Chief Complaint  Patient presents with   Urticaria    Unsure of cause; states it never really went away from a month ago      Subjective: Pt presents for an OV with complaints urticaria that has been daily since over a month ago.  She reports this has occurred a few times in her life since she was younger.  She has been tried on prednisone, hydroxyzine and Zyrtec without great benefit.  She states that she used at this time but currently she does not feel like it is helping at all.     05/23/2022    1:17 PM 01/24/2022    8:31 AM 12/17/2021    8:24 AM 11/01/2021    9:30 AM  Depression screen PHQ 2/9  Decreased Interest 0 0 0 0  Down, Depressed, Hopeless 0 0 0 0  PHQ - 2 Score 0 0 0 0  Altered sleeping  0    Tired, decreased energy  0    Change in appetite  0    Feeling bad or failure about yourself   0    Trouble concentrating  0    Moving slowly or fidgety/restless  0    Suicidal thoughts  0    PHQ-9 Score  0      Allergies  Allergen Reactions   Penicillins Shortness Of Breath and Other (See Comments)    Shut patients throat.   Sulfa Antibiotics Shortness Of Breath and Other (See Comments)    Shuts patient's throat.   Avocado Hives   Social History   Social History Narrative   Marital status/children/pets: Single. G3P3   Education/employment: HS, employed at [employer].    Safety:      -smoke alarm in the home:Yes     - wears seatbelt: Yes     - Feels safe in their relationships: Yes      Past Medical History:  Diagnosis Date   Asthma    Cellulitis    cellulitis/abcess x15, "every 2 months"   Urticaria    Past Surgical History:  Procedure Laterality Date   CESAREAN SECTION     EYE SURGERY      x3   OPEN REDUCTION INTERNAL FIXATION (ORIF) METACARPAL Right 06/13/2019   Procedure: OPEN TREATMENT OF RIGHT 5TH METACARPAL FRACTURE;  Surgeon: Milly Jakob, MD;  Location: [facility];  Service: Orthopedics;  Laterality: Right;   TUBAL LIGATION     Family History  Problem Relation Age of Onset   Cancer Father        unknown type   Lung cancer Father    Diabetes Mother    Asthma Mother    Hepatitis C Mother    Asthma Son    Allergies as of 09/11/2022       Reactions   Penicillins Shortness Of Breath, Other (See Comments)   Shut patients throat.   Sulfa Antibiotics Shortness Of Breath, Other (See Comments)   Shuts patient's throat.   Avocado Hives        Medication List        Accurate as of September 11, 2022  1:56 PM. If you have any questions, ask your nurse  or doctor.          STOP taking these medications    gabapentin 300 MG capsule Commonly known as: NEURONTIN Stopped by: Felix Pacini, DO   HYDROcodone-acetaminophen 5-325 MG tablet Commonly known as: Norco Stopped by: Felix Pacini, DO   ibuprofen 800 MG tablet Commonly known as: ADVIL Stopped by: Felix Pacini, DO   ProAir Digihaler 108 (90 Base) MCG/ACT Aepb Generic drug: Albuterol Sulfate (sensor) Stopped by: Felix Pacini, DO       TAKE these medications    doxepin 50 MG capsule Commonly known as: SINEQUAN Take 1 capsule (50 mg total) by mouth at bedtime. Started by: Felix Pacini, DO   famotidine 40 MG tablet Commonly known as: PEPCID Take 1/2 tablet twice a day.   fluticasone 50 MCG/ACT nasal spray Commonly known as: FLONASE Place 1-2 sprays into both nostrils daily as needed for rhinitis.   hydrOXYzine 50 MG capsule Commonly known as: Vistaril Take 1-2 capsules (50-100 mg total) by mouth at bedtime as needed.   levocetirizine 5 MG tablet Commonly known as: XYZAL Take 1 tablet (5 mg total) by mouth in the morning and at bedtime.   lidocaine 5 % Commonly known  as: Lidoderm Place 1 patch onto the skin daily. Remove & Discard patch within 12 hours   Risperidone 0.25 MG Tbdp Take 1 tablet (0.25 mg total) by mouth daily. Started by: Felix Pacini, DO   solifenacin 5 MG tablet Commonly known as: VESIcare Take 1 tablet (5 mg total) by mouth daily.   Trelegy Ellipta 200-62.5-25 MCG/ACT Aepb Generic drug: Fluticasone-Umeclidin-Vilant Inhale 1 puff into the lungs daily. Rinse mouth after each use.   Vitamin D (Ergocalciferol) 1.25 MG (50000 UNIT) Caps capsule Commonly known as: DRISDOL Take 1 capsule (50,000 Units total) by mouth every 7 (seven) days.        All past medical history, surgical history, allergies, family history, immunizations andmedications were updated in the EMR today and reviewed under the history and medication portions of their EMR.     ROS Negative, with the exception of above mentioned in HPI   Objective:  BP 125/77   Pulse 76   Temp 98.2 F (36.8 C)   Wt 147 lb 9.6 oz (67 kg)   SpO2 97%   BMI 27.89 kg/m  Body mass index is 27.89 kg/m. Physical Exam Vitals and nursing note reviewed.  Constitutional:      General: She is not in acute distress.    Appearance: Normal appearance. She is normal weight. She is not ill-appearing or toxic-appearing.  Eyes:     Extraocular Movements: Extraocular movements intact.     Conjunctiva/sclera: Conjunctivae normal.     Pupils: Pupils are equal, round, and reactive to light.  Skin:    Findings: Rash present.     Comments: Red raised welts bilateral legs, breasts and around neck.  Neurological:     Mental Status: She is alert and oriented to person, place, and time. Mental status is at baseline.  Psychiatric:        Mood and Affect: Mood normal.        Behavior: Behavior normal.        Thought Content: Thought content normal.        Judgment: Judgment normal.      No results found. No results found. No results found for this or any previous visit (from the past 24  hour(s)).  Assessment/Plan: Alison Vega is a 41 y.o. female present for OV for  Chronic idiopathic urticaria/bipolar disorder Start doxepin nightly Start risperidone 0.5 mg in the day (history of bipolar disorder) Continue Zyrtec Do not use nighttime dose of hydroxyzine with doxepin, until we evaluate if the doxepin makes her sleepy.  If it does not then she can continue the hydroxyzine at night if it is helpful.  She reported understanding - methylPREDNISolone acetate (DEPO-MEDROL) injection 80 mg Follow-up 4 to 6 weeks Reviewed expectations re: course of current medical issues. Discussed self-management of symptoms. Outlined signs and symptoms indicating need for more acute intervention. Patient verbalized understanding and all questions were answered. Patient received an After-Visit Summary.    No orders of the defined types were placed in this encounter.  Meds ordered this encounter  Medications   doxepin (SINEQUAN) 50 MG capsule    Sig: Take 1 capsule (50 mg total) by mouth at bedtime.    Dispense:  30 capsule    Refill:  5   Risperidone 0.25 MG TBDP    Sig: Take 1 tablet (0.25 mg total) by mouth daily.    Dispense:  30 tablet    Refill:  5   methylPREDNISolone acetate (DEPO-MEDROL) injection 80 mg   Referral Orders  No referral(s) requested today     Note is dictated utilizing voice recognition software. Although note has been proof read prior to signing, occasional typographical errors still can be missed. If any questions arise, please do not hesitate to call for verification.   electronically signed by:  Howard Pouch, DO  [place]

## 2022-09-11 NOTE — Telephone Encounter (Signed)
LVM for pt to call to discuss.   Advise pt to see if Rx can be transferred to a pharmacy closer to her that may have Rx in stock. Only other advise I can give is to see if [facility] pharmacy has it and have it mailed to her home.

## 2022-09-11 NOTE — Telephone Encounter (Signed)
Spoke with patient regarding results/recommendations.  

## 2022-09-11 NOTE — Patient Instructions (Addendum)
Return in about 6 weeks (around 10/23/2022) for hives.        Great to see you today.  I have refilled the medication(s) we provide.   If labs were collected, we will inform you of lab results once received either by echart message or telephone call.   - echart message- for normal results that have been seen by the patient already.   - telephone call: abnormal results or if patient has not viewed results in their echart. Hives Hives (urticaria) are itchy, red, swollen areas on the skin. Hives can appear on any part of the body. Hives often fade within 24 hours (acute hives). Sometimes, new hives appear after old ones fade and the cycle can continue for several days or weeks (chronic hives). Hives do not spread from person to person (are not contagious). Hives come from the body's reaction to something a person is allergic to (allergen), something that causes irritation, or various other triggers. When a person is exposed to a trigger, his or her body releases a chemical (histamine) that causes redness, itching, and swelling. Hives can appear right after exposure to a trigger or hours later. What are the causes? This condition may be caused by: Allergies to foods or ingredients. Insect bites or stings. Exposure to pollen or pets. Spending time in sunlight, heat, or cold (exposure). Exercise. Stress. You can also get hives from other medical conditions and treatments, such as: Viruses, including the common cold. Bacterial infections, such as urinary tract infections and strep throat. Certain medicines. Contact with latex or chemicals. Allergy shots. Blood transfusions. Sometimes, the cause of this condition is not known (idiopathic hives). What increases the risk? You are more likely to develop this condition if you: Are a woman. Have food allergies, especially to citrus fruits, milk, eggs, peanuts, tree nuts, or shellfish. Are allergic  to: Medicines. Latex. Insects. Animals. Pollen. What are the signs or symptoms? Common symptoms of this condition include raised, itchy, red or white bumps or patches on your skin. These areas may: Become large and swollen (welts). Change in shape and location, quickly and repeatedly. Be separate hives or connect over a large area of skin. Sting or become painful. Turn white when pressed in the center (blanch). In severe cases, your hands, feet, and face may also become swollen. This may occur if hives develop deeper in your skin. How is this diagnosed? This condition may be diagnosed by your symptoms, medical history, and physical exam. Your skin, urine, or blood may be tested to find out what is causing your hives and to rule out other health issues. Your health care provider may also remove a small sample of skin from the affected area and examine it under a microscope (biopsy). How is this treated? Treatment for this condition depends on the cause and severity of your symptoms. Your health care provider may recommend using cool, wet cloths (cool compresses) or taking cool showers to relieve itching. Treatment may include: Medicines that help: Relieve itching (antihistamines). Reduce swelling (corticosteroids). Treat infection (antibiotics). An injectable medicine (omalizumab). Your health care provider may prescribe this if you have chronic idiopathic hives and you continue to have symptoms even after treatment with antihistamines. Severe cases may require an emergency injection of adrenaline (epinephrine) to prevent a life-threatening allergic reaction (anaphylaxis). Follow these instructions at home: Medicines Take and apply over-the-counter and prescription medicines only as told by your health care provider. If you were prescribed an antibiotic medicine, take it as told by your  health care provider. Do not stop using the antibiotic even if you start to feel better. Skin  care Apply cool compresses to the affected areas. Do not scratch or rub your skin. General instructions Do not take hot showers or baths. This can make itching worse. Do not wear tight-fitting clothing. Use sunscreen and wear protective clothing when you are outside. Avoid any substances that cause your hives. Keep a journal to help track what causes your hives. Write down: What medicines you take. What you eat and drink. What products you use on your skin. Keep all follow-up visits as told by your health care provider. This is important. Contact a health care provider if: Your symptoms are not controlled with medicine. Your joints are painful or swollen. Get help right away if: You have a fever. You have pain in your abdomen. Your tongue or lips are swollen. Your eyelids are swollen. Your chest or throat feels tight. You have trouble breathing or swallowing. These symptoms may represent a serious problem that is an emergency. Do not wait to see if the symptoms will go away. Get medical help right away. Call your local emergency services (911 in the [place] Do not drive yourself to the hospital. Summary Hives (urticaria) are itchy, red, swollen areas on your skin. Hives come from the body's reaction to something a person is allergic to (allergen), something that causes irritation, or various other triggers. Treatment for this condition depends on the cause and severity of your symptoms. Avoid any substances that cause your hives. Keep a journal to help track what causes your hives. Take and apply over-the-counter and prescription medicines only as told by your health care provider. Get help right away if your chest or throat feels tight or if you have trouble breathing or swallowing. This information is not intended to replace advice given to you by your health care provider. Make sure you discuss any questions you have with your health care provider. Document Revised: 03/05/2022  Document Reviewed: 12/09/2020 Elsevier Patient Education  [place].

## 2022-09-22 NOTE — Progress Notes (Unsigned)
Follow Up Note  RE: Nina Mondor MRN: [id number] DOB: September 11, 1981 Date of Office Visit: 09/23/2022  Referring provider: Ma Hillock, DO Primary care provider: Ma Hillock, DO  Chief Complaint: Urticaria (Is not itchy anymore but is still having the breakouts ) and Asthma (No issues)  History of Present Illness: I had the pleasure of seeing [facility] for a follow up visit at the [facility] [place] on 09/23/2022. She is a 41 y.o. female, who is being followed for CIU, asthma, nonallergic rhinitis. Her previous allergy office visit was on 08/26/2022 with Dr. Maudie Mercury. Today is a regular follow up visit.  Chronic idiopathic urticaria Currently taking doxepin 42m daily at night, risperidone daily (h/o bipolar), Xyzal 560mdaily, famotidine 2055mwice a day.  She also received another depo 71m71mjection at her PCP's office earlier this month.   Patient is still breaking out but her insurance was very expensive when she was on Xolair and can't afford it. Will give her a sample dose and see how much it would cost her.  She is still on short term disability and has not been working.   Moderate persistent asthma Taking Trelegy 200mc71mpuff once a day and still using sample so not sure how much it would cost her.  Denies any SOB, coughing, wheezing, chest tightness, nocturnal awakenings, ER/urgent care visits or prednisone use since the last visit.  Nonallergic rhinitis Doing better without not being around her dusty work environment.   Patient has some anxiety about going to public places with lots of people. She tries to go to the stores when it's less busy.   Assessment and Plan: HolliHiliary 41 y.78 female with: Chronic idiopathic urticaria Past history - Lab evaluation unremarkable (FCeRI antibody, anti-thyroglobulin antibody, thyroid peroxidase antibody, tryptase, H. pylori serology, CBC, CMP, ESR, ANA, and alpha-gal panel). On Xolair in the past.  Interim history  -  doing better now but still having itching. Wants to restart Xolair but concerned about insurance coverage due to her financial issues. Had another depo injection by PCP.  Gave Xolair 300mg 66mction sample today - continue every 4 weeks.  If too expensive, we may be able to get more samples for you.  Epipen sent in and consent signed.  Take the following medications:  Doxepin 50mg o49ma day at night.  Xyzal (levocetirizine) 5mg onc57m day in the morning. Pepcid (famotidine) 20mg twi92m day (take 1/2 tablet of the 40mg tabl46mwice a day) Avoid the following potential triggers: alcohol, tight clothing, NSAIDs, hot showers and getting overheated.  Moderate persistent asthma without complication Past history - normal CXR in 2021. Symbicort and Breo too expensive. Prednisone in December 2022.  Interim history - doing well with Trelegy and no albuterol use but has not been working. Today's spirometry was normal.  Daily controller medication(s): Trelegy 200mcg 1 pu60mnce a day and rinse mouth after each. Samples given. If too expensive let me know.  May use albuterol rescue inhaler 2-4 puffs every 4 to 6 hours as needed for shortness of breath, chest tightness, coughing, and wheezing. May use albuterol rescue inhaler 2 puffs 5 to 15 minutes prior to strenuous physical activities. Monitor frequency of use.   Nonallergic rhinitis Past history - 2020 skin testing negative to pediatric environmental panel.  Interim history - not needing to use anything as not working in dusty environment.  Use Flonase (fluticasone) nasal spray 1 spray per nostril twice a day  as needed for nasal congestion.  Nasal saline spray (i.e., Simply Saline) or nasal saline lavage (i.e., NeilMed) is recommended as needed and prior to medicated nasal sprays. May take Xyzal (levocetirizine) 32m 1-2 times daily as needed.   Return in about 4 weeks (around 10/21/2022).  Continue risperidone daily as per PCP.  Meds ordered  this encounter  Medications   EPINEPHrine 0.3 mg/0.3 mL IJ SOAJ injection    Sig: Inject 0.3 mg into the muscle as needed for anaphylaxis.    Dispense:  2 each    Refill:  1    May dispense generic/Mylan/Teva brand.   Fluticasone-Umeclidin-Vilant (TRELEGY ELLIPTA) 200-62.5-25 MCG/ACT AEPB    Sig: Inhale 1 puff into the lungs daily. Rinse mouth after each use.    Dispense:  60 each    Refill:  5   omalizumab (XOLAIR) injection 300 mg   Lab Orders  No laboratory test(s) ordered today    Diagnostics: Spirometry:  Tracings reviewed. Her effort: Good reproducible efforts. FVC: 2.94L FEV1: 2.66L, 94% predicted FEV1/FVC ratio: 90% Interpretation: Spirometry consistent with normal pattern.  Please see scanned spirometry results for details.  Medication List:  Current Outpatient Medications  Medication Sig Dispense Refill   doxepin (SINEQUAN) 50 MG capsule Take 1 capsule (50 mg total) by mouth at bedtime. 30 capsule 5   EPINEPHrine 0.3 mg/0.3 mL IJ SOAJ injection Inject 0.3 mg into the muscle as needed for anaphylaxis. 2 each 1   famotidine (PEPCID) 40 MG tablet Take 1/2 tablet twice a day. 30 tablet 5   fluticasone (FLONASE) 50 MCG/ACT nasal spray Place 1-2 sprays into both nostrils daily as needed for rhinitis. 16 g 5   hydrOXYzine (VISTARIL) 50 MG capsule Take 1-2 capsules (50-100 mg total) by mouth at bedtime as needed. 180 capsule 3   levocetirizine (XYZAL) 5 MG tablet Take 1 tablet (5 mg total) by mouth in the morning and at bedtime. 60 tablet 5   lidocaine (LIDODERM) 5 % Place 1 patch onto the skin daily. Remove & Discard patch within 12 hours 30 patch 0   Risperidone 0.25 MG TBDP Take 1 tablet (0.25 mg total) by mouth daily. 30 tablet 5   solifenacin (VESICARE) 5 MG tablet Take 1 tablet (5 mg total) by mouth daily. 30 tablet 5   Vitamin D, Ergocalciferol, (DRISDOL) 1.25 MG (50000 UNIT) CAPS capsule Take 1 capsule (50,000 Units total) by mouth every 7 (seven) days. 12 capsule 4    Fluticasone-Umeclidin-Vilant (TRELEGY ELLIPTA) 200-62.5-25 MCG/ACT AEPB Inhale 1 puff into the lungs daily. Rinse mouth after each use. 60 each 5   Current Facility-Administered Medications  Medication Dose Route Frequency Provider Last Rate Last Admin   omalizumab (Arvid Right injection 300 mg  300 mg Subcutaneous Q28 days KGarnet Sierras DO   300 mg at 09/23/22 09179  Allergies: Allergies  Allergen Reactions   Penicillins Shortness Of Breath and Other (See Comments)    Shut patients throat.   Sulfa Antibiotics Shortness Of Breath and Other (See Comments)    Shuts patient's throat.   Avocado Hives   I reviewed her past medical history, social history, family history, and environmental history and no significant changes have been reported from her previous visit.  Review of Systems  Constitutional:  Negative for appetite change, chills, fever and unexpected weight change.  HENT:  Negative for congestion and rhinorrhea.   Eyes:  Negative for itching.  Respiratory:  Negative for cough, chest tightness, shortness of breath and wheezing.  Gastrointestinal:  Negative for abdominal pain.  Skin:  Positive for rash.       pruritus  Allergic/Immunologic: Negative for environmental allergies and food allergies.  Neurological:  Negative for headaches.    Objective: BP 128/82   Pulse 83   Temp 97.9 F (36.6 C)   Resp 16   Ht _0  (1.575 m)   Wt 154 lb 8 oz (70.1 kg)   SpO2 97%   BMI 28.26 kg/m  Body mass index is 28.26 kg/m. Physical Exam Vitals and nursing note reviewed.  Constitutional:      Appearance: Normal appearance. She is well-developed.  HENT:     Head: Normocephalic and atraumatic.     Right Ear: Tympanic membrane and external ear normal.     Left Ear: Tympanic membrane and external ear normal.     Nose: Nose normal.     Mouth/Throat:     Mouth: Mucous membranes are moist.     Pharynx: Oropharynx is clear.  Eyes:     Conjunctiva/sclera: Conjunctivae normal.   Cardiovascular:     Rate and Rhythm: Normal rate and regular rhythm.     Heart sounds: Normal heart sounds. No murmur heard.    No friction rub. No gallop.  Pulmonary:     Effort: Pulmonary effort is normal.     Breath sounds: Normal breath sounds. No wheezing or rales.  Musculoskeletal:     Cervical back: Neck supple.  Skin:    General: Skin is warm.     Findings: No rash.     Comments: No hives today.  Neurological:     Mental Status: She is alert and oriented to person, place, and time.  Psychiatric:        Behavior: Behavior normal.    Previous notes and tests were reviewed. The plan was reviewed with the patient/family, and all questions/concerned were addressed.  It was my pleasure to see [facility] today and participate in her care. Please feel free to contact me with any questions or concerns.  Sincerely,  Rexene Alberts, DO Allergy & Immunology  [facility] office: [place] office: [phone]

## 2022-09-23 ENCOUNTER — Ambulatory Visit (INDEPENDENT_AMBULATORY_CARE_PROVIDER_SITE_OTHER): Payer: BC Managed Care – PPO | Admitting: Allergy

## 2022-09-23 ENCOUNTER — Encounter: Payer: Self-pay | Admitting: Allergy

## 2022-09-23 VITALS — BP 128/82 | HR 83 | Temp 97.9°F | Resp 16 | Ht 62.0 in | Wt 154.5 lb

## 2022-09-23 DIAGNOSIS — J310 Chronic rhinitis: Secondary | ICD-10-CM

## 2022-09-23 DIAGNOSIS — J4540 Moderate persistent asthma, uncomplicated: Secondary | ICD-10-CM

## 2022-09-23 DIAGNOSIS — L501 Idiopathic urticaria: Secondary | ICD-10-CM

## 2022-09-23 MED ORDER — EPINEPHRINE 0.3 MG/0.3ML IJ SOAJ: 0.3000 mg | INTRAMUSCULAR | 1 refills | Status: DC | PRN

## 2022-09-23 MED ORDER — TRELEGY ELLIPTA 200-62.5-25 MCG/ACT IN AEPB: 1.0000 | INHALATION_SPRAY | Freq: Every day | RESPIRATORY_TRACT | 5 refills | Status: DC

## 2022-09-23 MED ORDER — OMALIZUMAB 150 MG ~~LOC~~ SOLR
300.0000 mg | SUBCUTANEOUS | Status: AC
  Administered 2022-09-23 – 2023-06-05 (×5): 300 mg via SUBCUTANEOUS

## 2022-09-23 NOTE — Patient Instructions (Addendum)
Urticaria Gave Xolair 300mg  injection sample today - continue every 4 weeks.  Tammy will be in touch with you about insurance costs. If too expensive, we may be able to get more samples for you.  Epipen sent in. Take the following medications:  Doxepin 50mg  once a day at night.  Xyzal (levocetirizine) 5mg  once a day in the morning. Pepcid (famotidine) 20mg  twice a day (take 1/2 tablet of the 40mg  tablet twice a day) Avoid the following potential triggers: alcohol, tight clothing, NSAIDs, hot showers and getting overheated.  Mood Continue risperidone daily as per PCP.  Asthma  Daily controller medication(s): Trelegy 1 puff once a day and rinse mouth after each. Samples given. If too expensive let me know.  May use albuterol rescue inhaler 2-4 puffs every 4 to 6 hours as needed for shortness of breath, chest tightness, coughing, and wheezing. May use albuterol rescue inhaler 2 puffs 5 to 15 minutes prior to strenuous physical activities. Monitor frequency of use.  Asthma control goals:  Full participation in all desired activities (may need albuterol before activity) Albuterol use two times or less a week on average (not counting use with activity) Cough interfering with sleep two times or less a month Oral steroids no more than once a year No hospitalizations  Chronic rhinitis Use Flonase (fluticasone) nasal spray 1 spray per nostril twice a day as needed for nasal congestion.  Nasal saline spray (i.e., Simply Saline) or nasal saline lavage (i.e., NeilMed) is recommended as needed and prior to medicated nasal sprays. May take Xyzal (levocetirizine) 5mg  1-2 times daily as needed.    Follow up in 1 months or sooner if needed.   If you run out of your medications before the next visit and you can't afford it then call our office so we can get you samples if we have them.

## 2022-09-23 NOTE — Assessment & Plan Note (Signed)
Past history - normal CXR in 2021. Symbicort and Breo too expensive. Prednisone in December 2022.  Interim history - doing well with Trelegy and no albuterol use but has not been working. Today's spirometry was normal.  Daily controller medication(s): Trelegy 1 puff once a day and rinse mouth after each. Samples given. If too expensive let me know.  May use albuterol rescue inhaler 2-4 puffs every 4 to 6 hours as needed for shortness of breath, chest tightness, coughing, and wheezing. May use albuterol rescue inhaler 2 puffs 5 to 15 minutes prior to strenuous physical activities. Monitor frequency of use.

## 2022-09-23 NOTE — Care Management (Signed)
Pt started on xolair biologic 300 mg q 4 weeks faxed signature consent to tammy to start paperwork pt waiting 30 minutes and had no issues

## 2022-09-23 NOTE — Assessment & Plan Note (Signed)
Past history - 2020 skin testing negative to pediatric environmental panel.  Interim history - not needing to use anything as not working in dusty environment.  Use Flonase (fluticasone) nasal spray 1 spray per nostril twice a day as needed for nasal congestion.  Nasal saline spray (i.e., Simply Saline) or nasal saline lavage (i.e., NeilMed) is recommended as needed and prior to medicated nasal sprays. May take Xyzal (levocetirizine) 5mg  1-2 times daily as needed.

## 2022-09-23 NOTE — Assessment & Plan Note (Signed)
Past history - Lab evaluation unremarkable (FCeRI antibody, anti-thyroglobulin antibody, thyroid peroxidase antibody, tryptase, H. pylori serology, CBC, CMP, ESR, ANA, and alpha-gal panel). On Xolair in the past.  Interim history -  doing better now but still having itching. Wants to restart Xolair but concerned about insurance coverage due to her financial issues. Had another depo injection by PCP.  Gave Xolair 3330m injection sample today - continue every 4 weeks.  If too expensive, we may be able to get more samples for you.  Epipen sent in and consent signed.  Take the following medications:  Doxepin 561monce a day at night.  Xyzal (levocetirizine) 30m4mnce a day in the morning. Pepcid (famotidine) 55m18mice a day (take 1/2 tablet of the 40mg64mlet twice a day) Avoid the following potential triggers: alcohol, tight clothing, NSAIDs, hot showers and getting overheated.

## 2022-09-29 ENCOUNTER — Telehealth: Payer: Self-pay | Admitting: *Deleted

## 2022-09-29 NOTE — Telephone Encounter (Signed)
Called patient and advised approval,copay card and submit to [facility]. Patient has appt for next inj in clinic

## 2022-09-29 NOTE — Telephone Encounter (Signed)
-----   Message from Alison Sia, DO sent at 09/23/2022  9:00 AM EST ----- Please start PA for Xolair 300mg  every 4 weeks for CIU. Can you tell patient how much she would have to pay for it? Currently having some financial issues. Sample given today. Thank you.

## 2022-10-07 ENCOUNTER — Ambulatory Visit: Payer: BC Managed Care – PPO | Admitting: Allergy

## 2022-10-09 ENCOUNTER — Telehealth: Payer: Self-pay | Admitting: *Deleted

## 2022-10-09 DIAGNOSIS — R202 Paresthesia of skin: Secondary | ICD-10-CM | POA: Diagnosis not present

## 2022-10-09 MED ORDER — LIDOCAINE 5 % EX PTCH
1.0000 | MEDICATED_PATCH | CUTANEOUS | 0 refills | Status: DC
Start: 2022-10-09 — End: 2023-07-21

## 2022-10-09 NOTE — Telephone Encounter (Signed)
Ok to rx per Dr. Jordan Likes. See meds.

## 2022-10-09 NOTE — Telephone Encounter (Signed)
You have never prescribed Lidocaine 5% patch. Ok to Rf?

## 2022-10-09 NOTE — Telephone Encounter (Signed)
-----   Message from Carl Best sent at 10/09/2022  2:50 PM EST ----- Regarding: Pt request Rx refill for Patches lidocaine (LIDODERM) 5 % [[id number]]   Order Details  Dose: 1 patch Route: Transdermal Frequency: Every 24 hours Dispense Quantity: 30 patches   --Patient now uses this Pharmacy:      [facility] [phone] - [place], [place] - [address] AT [facility] OF [address] & [address]  Phone: [phone] Fax: [phone]

## 2022-10-10 ENCOUNTER — Ambulatory Visit (INDEPENDENT_AMBULATORY_CARE_PROVIDER_SITE_OTHER): Payer: BC Managed Care – PPO | Admitting: Family Medicine

## 2022-10-10 ENCOUNTER — Encounter: Payer: Self-pay | Admitting: Family Medicine

## 2022-10-10 VITALS — BP 120/86 | Ht 62.0 in | Wt 155.0 lb

## 2022-10-10 DIAGNOSIS — M533 Sacrococcygeal disorders, not elsewhere classified: Secondary | ICD-10-CM

## 2022-10-10 NOTE — Assessment & Plan Note (Signed)
Continues to have pain that she localizes to the right SI joint.  Recent EMG study was normal.  We have tried an SI joint injection with no improvement.  We have tried an epidural on improvement.  Physical therapy seem to exacerbate her symptoms.  She may have involvement that is more related to the pelvis. -Counseled on home exercise therapy and supportive care. -Provided work note. -Pursue brace. -Could consider imaging of the pelvis.

## 2022-10-10 NOTE — Progress Notes (Signed)
  Alison Vega - 41 y.o. female MRN [id number]  Date of birth: June 17, 1981  SUBJECTIVE:  Including CC & ROS.  No chief complaint on file.   Alison Vega is a 41 y.o. female that is  following up for her right sided low back pain. Continues to have pain. Her EMG was showing no changes.    Review of Systems See HPI   HISTORY: Past Medical, Surgical, Social, and Family History Reviewed & Updated per EMR.   Pertinent Historical Findings include:  Past Medical History:  Diagnosis Date   Asthma    Cellulitis    cellulitis/abcess x15, "every 2 months"   Urticaria     Past Surgical History:  Procedure Laterality Date   CESAREAN SECTION     EYE SURGERY     x3   OPEN REDUCTION INTERNAL FIXATION (ORIF) METACARPAL Right 06/13/2019   Procedure: OPEN TREATMENT OF RIGHT 5TH METACARPAL FRACTURE;  Surgeon: Mack Hook, MD;  Location: [facility];  Service: Orthopedics;  Laterality: Right;   TUBAL LIGATION       PHYSICAL EXAM:  VS: BP 120/86   Ht 5\' 2"  (1.575 m)   Wt 155 lb (70.3 kg)   BMI 28.35 kg/m  Physical Exam Gen: NAD, alert, cooperative with exam, well-appearing MSK:  Neurovascularly intact       ASSESSMENT & PLAN:   Sacroiliac joint dysfunction of right side Continues to have pain that she localizes to the right SI joint.  Recent EMG study was normal.  We have tried an SI joint injection with no improvement.  We have tried an epidural on improvement.  Physical therapy seem to exacerbate her symptoms.  She may have involvement that is more related to the pelvis. -Counseled on home exercise therapy and supportive care. -Provided work note. -Pursue brace. -Could consider imaging of the pelvis.

## 2022-10-10 NOTE — Patient Instructions (Signed)
Good to see you Please continue heat  Please continue the exercises  The [employer] rep will call you about the brace  Please send me a message in MyChart with any questions or updates.  Please see me back in 6-8 weeks.   --Dr. Jordan Likes

## 2022-10-21 ENCOUNTER — Ambulatory Visit (INDEPENDENT_AMBULATORY_CARE_PROVIDER_SITE_OTHER): Payer: BC Managed Care – PPO

## 2022-10-21 DIAGNOSIS — M533 Sacrococcygeal disorders, not elsewhere classified: Secondary | ICD-10-CM | POA: Diagnosis not present

## 2022-10-21 DIAGNOSIS — L501 Idiopathic urticaria: Secondary | ICD-10-CM

## 2022-11-18 ENCOUNTER — Ambulatory Visit (INDEPENDENT_AMBULATORY_CARE_PROVIDER_SITE_OTHER): Payer: BC Managed Care – PPO

## 2022-11-18 DIAGNOSIS — L501 Idiopathic urticaria: Secondary | ICD-10-CM

## 2022-11-19 ENCOUNTER — Ambulatory Visit (HOSPITAL_COMMUNITY)
Admission: EM | Admit: 2022-11-19 | Discharge: 2022-11-19 | Disposition: A | Discharge status: Home / Self Care | Payer: BC Managed Care – PPO | Attending: Family Medicine | Admitting: Family Medicine

## 2022-11-19 ENCOUNTER — Ambulatory Visit (INDEPENDENT_AMBULATORY_CARE_PROVIDER_SITE_OTHER): Payer: BC Managed Care – PPO

## 2022-11-19 ENCOUNTER — Encounter (HOSPITAL_COMMUNITY): Payer: Self-pay | Admitting: Emergency Medicine

## 2022-11-19 DIAGNOSIS — Z203 Contact with and (suspected) exposure to rabies: Secondary | ICD-10-CM

## 2022-11-19 DIAGNOSIS — W540XXA Bitten by dog, initial encounter: Secondary | ICD-10-CM

## 2022-11-19 DIAGNOSIS — M79641 Pain in right hand: Secondary | ICD-10-CM | POA: Diagnosis not present

## 2022-11-19 DIAGNOSIS — S61451A Open bite of right hand, initial encounter: Secondary | ICD-10-CM | POA: Diagnosis not present

## 2022-11-19 DIAGNOSIS — M7989 Other specified soft tissue disorders: Secondary | ICD-10-CM | POA: Diagnosis not present

## 2022-11-19 MED ORDER — RABIES IMMUNE GLOBULIN 150 UNIT/ML IM INJ
INJECTION | INTRAMUSCULAR | Status: AC
  Filled 2022-11-19: qty 10

## 2022-11-19 MED ORDER — RABIES IMMUNE GLOBULIN 150 UNIT/ML IM INJ
20.0000 [IU]/kg | INJECTION | Freq: Once | INTRAMUSCULAR | Status: AC
  Administered 2022-11-19: 1425 [IU]

## 2022-11-19 MED ORDER — HYDROCODONE-ACETAMINOPHEN 5-325 MG PO TABS: 1.0000 | ORAL_TABLET | Freq: Four times a day (QID) | ORAL | 0 refills | Status: DC | PRN

## 2022-11-19 MED ORDER — RABIES VACCINE, PCEC IM SUSR
INTRAMUSCULAR | Status: AC
  Filled 2022-11-19: qty 1

## 2022-11-19 MED ORDER — RABIES VACCINE, PCEC IM SUSR
1.0000 mL | Freq: Once | INTRAMUSCULAR | Status: AC
  Administered 2022-11-19: 1 mL via INTRAMUSCULAR

## 2022-11-19 MED ORDER — DOXYCYCLINE HYCLATE 100 MG PO CAPS: 100.0000 mg | ORAL_CAPSULE | Freq: Two times a day (BID) | ORAL | 0 refills | Status: DC

## 2022-11-19 NOTE — ED Provider Notes (Addendum)
[place]   [id number] 11/19/22 Arrival Time: 3532  ASSESSMENT & PLAN:  1. Right hand pain   2. Dog bite of right hand, initial encounter    I have personally viewed the imaging studies ordered this visit. No acute bony abnormalities of R hand; with surgical plate over 5th metacarpal; no radiopaque FB appreciated.  Meds ordered this encounter  Medications   rabies immune globulin (HYPERRAB/KEDRAB) injection 1,425 Units   rabies vaccine (RABAVERT) injection 1 mL   doxycycline (VIBRAMYCIN) 100 MG capsule    Sig: Take 1 capsule (100 mg total) by mouth 2 (two) times daily.    Dispense:  14 capsule    Refill:  0   HYDROcodone-acetaminophen (NORCO/VICODIN) 5-325 MG tablet    Sig: Take 1 tablet by mouth every 6 (six) hours as needed for severe pain.    Dispense:  4 tablet    Refill:  0   [place] Controlled Substances Registry consulted for this patient. I feel the risk/benefit ratio today is favorable for proceeding with this prescription for a controlled substance. Medication sedation precautions given.    Discharge Instructions       Meds ordered this encounter  Medications   rabies immune globulin (HYPERRAB/KEDRAB) injection 1,425 Units   rabies vaccine (RABAVERT) injection 1 mL   In addition to the human rabies immune globulin (HRIG), you were given the first dose of the rabies vaccine today (Day 0).  Please return here on the following dates to complete the rabies series: Day 3: 11/22/2022 Day 7: 11/26/2022 Day 14: 12/03/2022  Be aware, you have been prescribed pain medications that may cause drowsiness. While taking this medication, do not take any other medications containing acetaminophen (Tylenol). Do not combine with alcohol or recreational drugs. Please do not drive, operate heavy machinery, or take part in activities that require making important decisions while on this medication as your judgement may be clouded.     Reviewed expectations re: course of  current medical issues. Questions answered. Outlined signs and symptoms indicating need for more acute intervention. Patient verbalized understanding. After Visit Summary given.   SUBJECTIVE:  Alison Vega is a 42 y.o. female who presents with a wound to R hand; dorsal; reports unprovoked dog bite to R hand 3 d ago; bleeding controlled; washed copiously at home. Continued swelling. Afebrile. No extremity sensation changes or weakness.  Td UTD: Yes, 2022..   OBJECTIVE:  Vitals:   11/19/22 1039 11/19/22 1102  BP: (!) 137/99   Pulse: 89   Resp: 18   Temp: 98.5 F (36.9 C)   TempSrc: Oral   SpO2: 99%   Weight:  71.1 kg     General appearance: alert; no distress RUE: two linear lacerations of dorsal R hand over 4/5 metacarpals (0.5 and 1.0 cm); no active bleeding; mild surrounding erythema and warmth; normal capillary refill and distal sensation clean wound edges, no foreign bodies; without active bleeding; all fingers with FROM Psychological: alert and cooperative; normal mood and affect   Labs Reviewed - No data to display  DG Hand Complete Right  Result Date: 11/19/2022 CLINICAL DATA:  Right hand pain after being bit by a dog EXAM: RIGHT HAND - COMPLETE 3+ VIEW COMPARISON:  06/03/2019 FINDINGS: Previous plate and screw fixation of fifth metacarpal bone fracture. There is mild diffuse soft tissue swelling. No signs of acute fracture or dislocation. No significant arthropathy. IMPRESSION: 1. No acute bone abnormality. 2. Previous plate and screw fixation of fifth metacarpal bone fracture. Electronically  Signed   By: Kerby Moors M.D.   On: 11/19/2022 11:20    Allergies  Allergen Reactions   Penicillins Shortness Of Breath and Other (See Comments)    Shut patients throat.   Sulfa Antibiotics Shortness Of Breath and Other (See Comments)    Shuts patient's throat.   Avocado Hives    Past Medical History:  Diagnosis Date   Asthma    Cellulitis    cellulitis/abcess x15,  "every 2 months"   Urticaria    Social History   Socioeconomic History   Marital status: Single    Spouse name: Not on file   Number of children: Not on file   Years of education: Not on file   Highest education level: Not on file  Occupational History   Not on file  Tobacco Use   Smoking status: Never    Passive exposure: Never   Smokeless tobacco: Never  Vaping Use   Vaping Use: Never used  Substance and Sexual Activity   Alcohol use: Yes    Comment: rarely   Drug use: No   Sexual activity: Not Currently    Partners: Male    Birth control/protection: Surgical    Comment: BTL  Other Topics Concern   Not on file  Social History Narrative   Marital status/children/pets: Single. G3P3   Education/employment: HS, employed at [employer].    Safety:      -smoke alarm in the home:Yes     - wears seatbelt: Yes     - Feels safe in their relationships: Yes      Social Determinants of Health   Financial Resource Strain: Not on file  Food Insecurity: Not on file  Transportation Needs: Not on file  Physical Activity: Not on file  Stress: Not on file  Social Connections: Not on file          Vanessa Kick, MD 11/19/22 [place], MD 11/19/22 1314

## 2022-11-19 NOTE — Discharge Instructions (Addendum)
Meds ordered this encounter  Medications   rabies immune globulin (HYPERRAB/KEDRAB) injection 1,425 Units   rabies vaccine (RABAVERT) injection 1 mL   In addition to the human rabies immune globulin (HRIG), you were given the first dose of the rabies vaccine today (Day 0).  Please return here on the following dates to complete the rabies series: Day 3: 11/22/2022 Day 7: 11/26/2022 Day 14: 12/03/2022  Be aware, you have been prescribed pain medications that may cause drowsiness. While taking this medication, do not take any other medications containing acetaminophen (Tylenol). Do not combine with alcohol or recreational drugs. Please do not drive, operate heavy machinery, or take part in activities that require making important decisions while on this medication as your judgement may be clouded.

## 2022-11-19 NOTE — ED Triage Notes (Signed)
Pt reports 3 days ago bit by stray dog trying to fight her dog.  Pt has swelling and pain to right hand that wont go down. Pt reports called her hand surgeon who told pt to go be seen and get xray to make sure plates weren't damaged.  Tylenol and advil not really helping.

## 2022-11-20 DIAGNOSIS — S61451A Open bite of right hand, initial encounter: Secondary | ICD-10-CM | POA: Diagnosis not present

## 2022-11-21 ENCOUNTER — Ambulatory Visit (INDEPENDENT_AMBULATORY_CARE_PROVIDER_SITE_OTHER): Payer: BC Managed Care – PPO | Admitting: Family Medicine

## 2022-11-21 ENCOUNTER — Encounter: Payer: Self-pay | Admitting: Family Medicine

## 2022-11-21 ENCOUNTER — Other Ambulatory Visit: Payer: Self-pay

## 2022-11-21 VITALS — BP 118/88 | Ht 62.0 in | Wt 156.0 lb

## 2022-11-21 DIAGNOSIS — M533 Sacrococcygeal disorders, not elsewhere classified: Secondary | ICD-10-CM | POA: Diagnosis not present

## 2022-11-21 NOTE — Assessment & Plan Note (Signed)
Acute on chronic after an injury at work.  We have tried an epidural and SI joint injections.  She has completed physical therapy. -Counseled on home exercise therapy and supportive care -MRI of the pelvis in order to evaluate for a posterior hip impingement as to the source of her ongoing pain. -Provided work note. -If MRI is unrevealing may need to consider an FCE.

## 2022-11-21 NOTE — Progress Notes (Signed)
  Alison Vega - 42 y.o. female MRN [id number]  Date of birth: February 09, 1981  SUBJECTIVE:  Including CC & ROS.  No chief complaint on file.   Alison Vega is a 42 y.o. female that is following up for her right leg pain after an injury at work.  Continues to have ongoing pain despite conservative measures.   Review of Systems See HPI   HISTORY: Past Medical, Surgical, Social, and Family History Reviewed & Updated per EMR.   Pertinent Historical Findings include:  Past Medical History:  Diagnosis Date   Asthma    Cellulitis    cellulitis/abcess x15, "every 2 months"   Urticaria     Past Surgical History:  Procedure Laterality Date   CESAREAN SECTION     EYE SURGERY     x3   OPEN REDUCTION INTERNAL FIXATION (ORIF) METACARPAL Right 06/13/2019   Procedure: OPEN TREATMENT OF RIGHT 5TH METACARPAL FRACTURE;  Surgeon: Milly Jakob, MD;  Location: [facility];  Service: Orthopedics;  Laterality: Right;   TUBAL LIGATION       PHYSICAL EXAM:  VS: BP 118/88   Ht 5\' 2"  (1.575 m)   Wt 156 lb (70.8 kg)   LMP 11/14/2022   BMI 28.53 kg/m  Physical Exam Gen: NAD, alert, cooperative with exam, well-appearing MSK:  Neurovascularly intact       ASSESSMENT & PLAN:   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  We have tried an epidural and SI joint injections.  She has completed physical therapy. -Counseled on home exercise therapy and supportive care -MRI of the pelvis in order to evaluate for a posterior hip impingement as to the source of her ongoing pain. -Provided work note. -If MRI is unrevealing may need to consider an FCE.

## 2022-11-21 NOTE — Patient Instructions (Signed)
Good to see you We'll get an MRi of the pelvis   Please send me a message in MyChart with any questions or updates.  We'll setup a virtual visit once the MRI is resulted.   --Dr. Raeford Razor

## 2022-12-16 ENCOUNTER — Ambulatory Visit (INDEPENDENT_AMBULATORY_CARE_PROVIDER_SITE_OTHER): Payer: BC Managed Care – PPO

## 2022-12-16 DIAGNOSIS — L501 Idiopathic urticaria: Secondary | ICD-10-CM | POA: Diagnosis not present

## 2022-12-24 NOTE — Addendum Note (Signed)
Addended by: Cresenciano Lick on: 12/24/2022 10:58 AM   Modules accepted: Orders

## 2022-12-31 ENCOUNTER — Other Ambulatory Visit: Payer: Self-pay | Admitting: Family Medicine

## 2023-01-01 ENCOUNTER — Telehealth: Payer: Self-pay | Admitting: *Deleted

## 2023-01-01 NOTE — Telephone Encounter (Signed)
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

## 2023-01-01 NOTE — Telephone Encounter (Signed)
I spoke to patient regarding her [employer] disability work papers. She states when you released her to work with light duty work restrictions, her work would not let her return. They do not allow light duty work. She states her new return to work date is now 01/09/23 with no restrictions. She states she can barely sweep her floors and bend down to pick up trash at her home. Papers are on your desk.  She also states received a call from [facility] on 12/31/22 that her pelvis MRI was denied due to lack of information from our office.

## 2023-01-01 NOTE — Telephone Encounter (Signed)
I called [employer] to confirm if they still need Dr. Raeford Razor to complete forms for patient's disability claim since she has been terminated. They are going to call patient and call us back for clarification.

## 2023-01-02 ENCOUNTER — Other Ambulatory Visit: Payer: BC Managed Care – PPO

## 2023-01-02 NOTE — Telephone Encounter (Signed)
Rec'd call from [place]- they still need difference in opinion forms completed and returned so they can help cover patient back to patient's initial claim in 2023.

## 2023-01-05 NOTE — Telephone Encounter (Signed)
Completed paperwork.   Rosemarie Ax, MD [facility] 01/05/2023, 1:57 PM

## 2023-01-13 ENCOUNTER — Ambulatory Visit: Payer: BC Managed Care – PPO

## 2023-01-21 ENCOUNTER — Other Ambulatory Visit: Payer: BC Managed Care – PPO

## 2023-01-24 ENCOUNTER — Ambulatory Visit (HOSPITAL_COMMUNITY)
Admission: EM | Admit: 2023-01-24 | Discharge: 2023-01-24 | Disposition: A | Discharge status: Home / Self Care | Payer: BC Managed Care – PPO | Attending: Physician Assistant | Admitting: Physician Assistant

## 2023-01-24 ENCOUNTER — Other Ambulatory Visit: Payer: Self-pay

## 2023-01-24 ENCOUNTER — Encounter (HOSPITAL_COMMUNITY): Payer: Self-pay | Admitting: Emergency Medicine

## 2023-01-24 DIAGNOSIS — S46911A Strain of unspecified muscle, fascia and tendon at shoulder and upper arm level, right arm, initial encounter: Secondary | ICD-10-CM

## 2023-01-24 DIAGNOSIS — M25511 Pain in right shoulder: Secondary | ICD-10-CM

## 2023-01-24 MED ORDER — NAPROXEN SODIUM 550 MG PO TABS: 550.0000 mg | ORAL_TABLET | Freq: Two times a day (BID) | ORAL | 0 refills | Status: DC

## 2023-01-24 MED ORDER — TIZANIDINE HCL 4 MG PO TABS: 4.0000 mg | ORAL_TABLET | Freq: Four times a day (QID) | ORAL | 0 refills | Status: DC | PRN

## 2023-01-24 MED ORDER — PREDNISONE 10 MG PO TABS: 10.0000 mg | ORAL_TABLET | Freq: Three times a day (TID) | ORAL | 0 refills | Status: DC

## 2023-01-24 NOTE — ED Triage Notes (Signed)
Onset 3 days ago of shoulder pain.  Patient is right handed.  Pain radiates down right arm.  Joint hurts if arm is not supported and hurts if using right hand/arm to push off furniture to stand.  Right radial pulse 2 +  Has used lidocaine patch, advil.

## 2023-01-24 NOTE — Discharge Instructions (Signed)
Advised take the Anaprox DS 550 mg, 1 every 12 hours with food to help decrease pain and discomfort. Advised take Zanaflex 4 mg every 6 hours on a regular basis to help decrease muscle spasm. Advised take prednisone 10 mg every 8 hours until completed to help reduce the inflammatory nerve component. Advised to use ice therapy, 10 minutes on 20 minutes off, 3-4 times throughout the day to help reduce pain and discomfort.  Advised to contact [facility] or [facility] for evaluation of the right shoulder if symptoms fail to improve over the next week.  Contact information as above.

## 2023-01-24 NOTE — ED Provider Notes (Signed)
[facility]    CSN: [id number] Arrival date & time: 01/24/23  [place]      History   Chief Complaint Chief Complaint  Patient presents with   Shoulder Pain    HPI Alison Vega is a 42 y.o. female.   42 year old female presents with right shoulder pain.  Patient indicates for the past 3 days she has been having persistent right shoulder pain and discomfort that travels down the right arm.  She indicates that there has been no history of trauma to the area, she has not been lifting any heavy objects pushing or pulling.  She indicates that the arm just started hurting.  She indicates she has been using lidocaine patches to the right shoulder without improvement of her symptoms.  She indicates the pain is worse with movement and with overhead reaching.  She is not having any numbness, tingling, or weakness at the area.  She does not have any shortness of breath, fever or chills.   Shoulder Pain Associated symptoms: neck pain (right shoulder pain)     Past Medical History:  Diagnosis Date   Asthma    Cellulitis    cellulitis/abcess x15, "every 2 months"   Urticaria     Patient Active Problem List   Diagnosis Date Noted   Bipolar disorder ([place]) 09/11/2022   Sacroiliac joint dysfunction of right side 08/11/2022   Piriformis syndrome of right side 05/21/2022   Visit for routine gyn exam 01/24/2022   Vitamin D deficiency 12/17/2021   Recurrent major depression in remission ([place]) 12/17/2021   Stress incontinence (female) (female) 12/17/2021   History of abnormal cervical Pap smear 12/17/2021   Primary insomnia 11/01/2021   Asthmatic bronchitis 11/01/2021   Moderate persistent asthma without complication [id number]   Chronic idiopathic urticaria 05/30/2021   Nonallergic rhinitis 09/13/2019    Past Surgical History:  Procedure Laterality Date   CESAREAN SECTION     EYE SURGERY     x3   OPEN REDUCTION INTERNAL FIXATION (ORIF) METACARPAL Right 06/13/2019   Procedure:  OPEN TREATMENT OF RIGHT 5TH METACARPAL FRACTURE;  Surgeon: Milly Jakob, MD;  Location: [facility];  Service: Orthopedics;  Laterality: Right;   TUBAL LIGATION      OB History     Gravida  3   Para  3   Term  3   Preterm      AB      Living  3      SAB      IAB      Ectopic      Multiple      Live Births  3            Home Medications    Prior to Admission medications   Medication Sig Start Date End Date Taking? Authorizing Provider  naproxen sodium (ANAPROX DS) 550 MG tablet Take 1 tablet (550 mg total) by mouth 2 (two) times daily with a meal. 01/24/23  Yes Nyoka Lint, PA-C  predniSONE (DELTASONE) 10 MG tablet Take 1 tablet (10 mg total) by mouth in the morning, at noon, and at bedtime. 01/24/23  Yes Nyoka Lint, PA-C  tiZANidine (ZANAFLEX) 4 MG tablet Take 1 tablet (4 mg total) by mouth every 6 (six) hours as needed for muscle spasms. 01/24/23  Yes Nyoka Lint, PA-C  doxepin (SINEQUAN) 50 MG capsule Take 1 capsule (50 mg total) by mouth at bedtime. 09/11/22   Kuneff, Renee A, DO  doxycycline (VIBRAMYCIN) 100 MG capsule Take  1 capsule (100 mg total) by mouth 2 (two) times daily. Patient not taking: Reported on 01/24/2023 11/19/22   Vanessa Kick, MD  EPINEPHrine 0.3 mg/0.3 mL IJ SOAJ injection Inject 0.3 mg into the muscle as needed for anaphylaxis. 09/23/22   Garnet Sierras, DO  famotidine (PEPCID) 40 MG tablet Take 1/2 tablet twice a day. Patient not taking: Reported on 01/24/2023 08/26/22   Garnet Sierras, DO  fluticasone [facility]) 50 MCG/ACT nasal spray Place 1-2 sprays into both nostrils daily as needed for rhinitis. 05/30/21   Garnet Sierras, DO  Fluticasone-Umeclidin-Vilant (TRELEGY ELLIPTA) 200-62.5-25 MCG/ACT AEPB Inhale 1 puff into the lungs daily. Rinse mouth after each use. 09/23/22   Garnet Sierras, DO  HYDROcodone-acetaminophen (NORCO/VICODIN) 5-325 MG tablet Take 1 tablet by mouth every 6 (six) hours as needed for severe pain. Patient not taking:  Reported on 01/24/2023 11/19/22   Vanessa Kick, MD  hydrOXYzine (VISTARIL) 50 MG capsule Take 1-2 capsules (50-100 mg total) by mouth at bedtime as needed. 12/17/21   Kuneff, Renee A, DO  levocetirizine (XYZAL) 5 MG tablet Take 1 tablet (5 mg total) by mouth in the morning and at bedtime. 08/26/22   Garnet Sierras, DO  lidocaine (LIDODERM) 5 % Place 1 patch onto the skin daily. Remove & Discard patch within 12 hours 10/09/22   Rosemarie Ax, MD  Risperidone 0.25 MG TBDP Take 1 tablet (0.25 mg total) by mouth daily. 09/11/22   Kuneff, Renee A, DO  solifenacin (VESICARE) 5 MG tablet Take 1 tablet (5 mg total) by mouth daily. 04/02/22   Chancy Milroy, MD  tiZANidine (ZANAFLEX) 4 MG tablet Take 4 mg by mouth every 8 (eight) hours as needed. 10/30/22   [provider]  Vitamin D, Ergocalciferol, (DRISDOL) 1.25 MG (50000 UNIT) CAPS capsule Take 1 capsule (50,000 Units total) by mouth every 7 (seven) days. Patient not taking: Reported on 01/24/2023 12/19/21   Ma Hillock, DO    Family History Family History  Problem Relation Age of Onset   Cancer Father        unknown type   Lung cancer Father    Diabetes Mother    Asthma Mother    Hepatitis C Mother    Asthma Son     Social History Social History   Tobacco Use   Smoking status: Never    Passive exposure: Never   Smokeless tobacco: Never  Vaping Use   Vaping Use: Never used  Substance Use Topics   Alcohol use: Yes    Comment: rarely   Drug use: No     Allergies   Penicillins, Sulfa antibiotics, and Avocado   Review of Systems Review of Systems  Musculoskeletal:  Positive for neck pain (right shoulder pain).     Physical Exam Triage Vital Signs ED Triage Vitals  Enc Vitals Group     BP 01/24/23 1748 (!) 152/105     Pulse Rate 01/24/23 1748 92     Resp 01/24/23 1748 20     Temp 01/24/23 1748 98.5 F (36.9 C)     Temp Source 01/24/23 1748 Oral     SpO2 01/24/23 1748 98 %     Weight --      Height --       Head Circumference --      Peak Flow --      Pain Score 01/24/23 1745 10     Pain Loc --      Pain Edu? --  Excl. in GC? --    No data found.  Updated Vital Signs BP (!) 142/96 (BP Location: Left Arm)   Pulse 92   Temp 98.5 F (36.9 C) (Oral)   Resp 20   SpO2 98%   Visual Acuity Right Eye Distance:   Left Eye Distance:   Bilateral Distance:    Right Eye Near:   Left Eye Near:    Bilateral Near:     Physical Exam Constitutional:      Appearance: Normal appearance.  Cardiovascular:     Rate and Rhythm: Normal rate and regular rhythm.     Heart sounds: Normal heart sounds.  Pulmonary:     Effort: Pulmonary effort is normal.     Breath sounds: Normal breath sounds and air entry. No wheezing, rhonchi or rales.  Musculoskeletal:       Arms:     Cervical back: Normal range of motion.     Comments: Right shoulder: Pain is palpated along the superior shoulder joint area, trapezius and right lateral neck area.  Range of motion is normal with pain at 90 degrees abduction or greater, stability is intact, strength is intact.  No crepitus with range of motion.  Neurological:     Mental Status: She is alert.      UC Treatments / Results  Labs (all labs ordered are listed, but only abnormal results are displayed) Labs Reviewed - No data to display  EKG   Radiology No results found.  Procedures Procedures (including critical care time)  Medications Ordered in UC Medications - No data to display  Initial Impression / Assessment and Plan / UC Course  I have reviewed the triage vital signs and the nursing notes.  Pertinent labs & imaging results that were available during my care of the patient were reviewed by me and considered in my medical decision making (see chart for details).    Plan: The diagnosis will be treated with the following: 1.  Right shoulder pain: A.  Advised to take Anaprox DS 550 mg every 12 hours with food to help decrease pain. B.   Zanaflex 4 mg every 6 hours to help reduce muscle spasm. 2.  Strain right shoulder: A.  Advised take Anaprox DS 550 mg every 12 hours to help reduce pain and discomfort. B.  Advised take prednisone 10 mg every 8 hours for 4 days only to help reduce acute inflammatory process. 3.  Advised ice therapy, 10 minutes on 20 minutes off, 3-4 times throughout the evening to help reduce pain discomfort. 4.  Advised follow-up PCP return to urgent care as needed. Final Clinical Impressions(s) / UC Diagnoses   Final diagnoses:  Acute pain of right shoulder  Strain of right shoulder, initial encounter     Discharge Instructions      Advised take the Anaprox DS 550 mg, 1 every 12 hours with food to help decrease pain and discomfort. Advised take Zanaflex 4 mg every 6 hours on a regular basis to help decrease muscle spasm. Advised take prednisone 10 mg every 8 hours until completed to help reduce the inflammatory nerve component. Advised to use ice therapy, 10 minutes on 20 minutes off, 3-4 times throughout the day to help reduce pain and discomfort.  Advised to contact [facility] or [facility] for evaluation of the right shoulder if symptoms fail to improve over the next week.  Contact information as above.    ED Prescriptions     Medication Sig Dispense Auth. Provider  naproxen sodium (ANAPROX DS) 550 MG tablet Take 1 tablet (550 mg total) by mouth 2 (two) times daily with a meal. 20 tablet Nyoka Lint, PA-C   tiZANidine (ZANAFLEX) 4 MG tablet Take 1 tablet (4 mg total) by mouth every 6 (six) hours as needed for muscle spasms. 30 tablet Nyoka Lint, PA-C   predniSONE (DELTASONE) 10 MG tablet Take 1 tablet (10 mg total) by mouth in the morning, at noon, and at bedtime. 12 tablet Nyoka Lint, PA-C      PDMP not reviewed this encounter.   Nyoka Lint, PA-C 01/24/23 1825

## 2023-01-26 ENCOUNTER — Emergency Department (HOSPITAL_COMMUNITY)
Admission: EM | Admit: 2023-01-26 | Discharge: 2023-01-26 | Disposition: A | Discharge status: Home / Self Care | Payer: Medicaid Other | Attending: Emergency Medicine | Admitting: Emergency Medicine

## 2023-01-26 ENCOUNTER — Emergency Department (HOSPITAL_COMMUNITY): Payer: Medicaid Other

## 2023-01-26 DIAGNOSIS — R9431 Abnormal electrocardiogram [ECG] [EKG]: Secondary | ICD-10-CM | POA: Diagnosis not present

## 2023-01-26 DIAGNOSIS — M25511 Pain in right shoulder: Secondary | ICD-10-CM | POA: Insufficient documentation

## 2023-01-26 MED ORDER — OXYCODONE HCL 5 MG PO TABS
5.0000 mg | ORAL_TABLET | Freq: Once | ORAL | Status: AC
  Administered 2023-01-26: 5 mg via ORAL
  Filled 2023-01-26: qty 1

## 2023-01-26 MED ORDER — KETOROLAC TROMETHAMINE 15 MG/ML IJ SOLN
15.0000 mg | Freq: Once | INTRAMUSCULAR | Status: AC
  Administered 2023-01-26: 15 mg via INTRAMUSCULAR
  Filled 2023-01-26: qty 1

## 2023-01-26 MED ORDER — NAPROXEN 500 MG PO TABS: 500.0000 mg | ORAL_TABLET | Freq: Two times a day (BID) | ORAL | 0 refills | Status: DC

## 2023-01-26 MED ORDER — ACETAMINOPHEN 500 MG PO TABS
1000.0000 mg | ORAL_TABLET | Freq: Once | ORAL | Status: AC
  Administered 2023-01-26: 1000 mg via ORAL
  Filled 2023-01-26: qty 2

## 2023-01-26 NOTE — ED Triage Notes (Signed)
Patient here with compliant of right shoulder pain that started five days ago. Seen at urgent care for same a few days ago. Denies injury, diagnosed with muscle strain. Patient is alert, oriented, and in no apparent distress at this time.

## 2023-01-26 NOTE — ED Provider Notes (Signed)
[facility] Provider Note   CSN: [id number] Arrival date & time: 01/26/23  1031     History Chief Complaint  Patient presents with   Shoulder Pain    HPI Alison Vega is a 42 y.o. female presenting for Chief complaint of right shoulder pain.  Endorses that she has had 5 days of significant right-sided shoulder pain.  It starts on the medial aspect of her scapula radiates up into her neck.  Denies any trauma.  Denies fevers chills nausea vomiting syncope shortness of breath.  Was seen in urgent care diagnosed musculoskeletal strain prescribed multiple medications.  Patient endorsing ongoing symptoms.   Patient's recorded medical, surgical, social, medication list and allergies were reviewed in the Snapshot window as part of the initial history.   Review of Systems   Review of Systems  Constitutional:  Negative for chills and fever.  HENT:  Negative for ear pain and sore throat.   Eyes:  Negative for pain and visual disturbance.  Respiratory:  Negative for cough and shortness of breath.   Cardiovascular:  Negative for chest pain and palpitations.  Gastrointestinal:  Negative for abdominal pain and vomiting.  Genitourinary:  Negative for dysuria and hematuria.  Musculoskeletal:  Negative for arthralgias and back pain.  Skin:  Negative for color change and rash.  Neurological:  Negative for seizures and syncope.  All other systems reviewed and are negative.   Physical Exam Updated Vital Signs BP (!) 141/76 (BP Location: Left Arm)   Pulse 70   Temp 98.2 F (36.8 C) (Oral)   Resp 18   SpO2 99%  Physical Exam Vitals and nursing note reviewed.  Constitutional:      General: She is not in acute distress.    Appearance: She is well-developed.  HENT:     Head: Normocephalic and atraumatic.  Eyes:     Conjunctiva/sclera: Conjunctivae normal.  Cardiovascular:     Rate and Rhythm: Normal rate and regular rhythm.     Heart sounds:  No murmur heard. Pulmonary:     Effort: Pulmonary effort is normal. No respiratory distress.     Breath sounds: Normal breath sounds.  Abdominal:     Palpations: Abdomen is soft.     Tenderness: There is no abdominal tenderness.  Musculoskeletal:        General: Tenderness (Tenderness palpation along the medial aspect of the right scapula.) present. No swelling.     Cervical back: Neck supple.  Skin:    General: Skin is warm and dry.     Capillary Refill: Capillary refill takes less than 2 seconds.  Neurological:     Mental Status: She is alert.  Psychiatric:        Mood and Affect: Mood normal.      ED Course/ Medical Decision Making/ A&P    Procedures Procedures   Medications Ordered in ED Medications  acetaminophen (TYLENOL) tablet 1,000 mg (1,000 mg Oral Given 01/26/23 1135)  ketorolac (TORADOL) 15 MG/ML injection 15 mg (15 mg Intramuscular Given 01/26/23 1136)  oxyCODONE (Oxy IR/ROXICODONE) immediate release tablet 5 mg (5 mg Oral Given 01/26/23 1231)    Medical Decision Making:    Alison Vega is a 42 y.o. female who presented to the ED today with chief complaint of right shoulder pain detailed above.    Patient states her shoulder pain is been present for 5 days.   Appears to musculoskeletal etiology.  Will evaluate for more sinister pathology such as pneumothorax,  hemothorax, skeletal injury with x-rays.  X-rays performed reviewed and grossly reassuring. Patient ambulatory tolerating p.o. intake.  Will treat with NSAIDs recommend follow-up with orthopedic due to severe nature of pain.  Pain under control at time of discharge.  Clinical Impression:  1. Acute pain of right shoulder      Discharge   Final Clinical Impression(s) / ED Diagnoses Final diagnoses:  Acute pain of right shoulder    Rx / DC Orders ED Discharge Orders          Ordered    naproxen (NAPROSYN) 500 MG tablet  2 times daily        01/26/23 1314              Tretha Sciara,  MD 01/26/23 1616

## 2023-01-26 NOTE — ED Notes (Signed)
RN informed EDP that pt rates pain 10/10

## 2023-01-26 NOTE — ED Notes (Signed)
Pt ambulated to restroom without incident.

## 2023-01-27 ENCOUNTER — Other Ambulatory Visit: Payer: Self-pay | Admitting: *Deleted

## 2023-01-27 MED ORDER — TRELEGY ELLIPTA 200-62.5-25 MCG/ACT IN AEPB: 1.0000 | INHALATION_SPRAY | Freq: Every day | RESPIRATORY_TRACT | 1 refills | Status: DC

## 2023-02-03 ENCOUNTER — Encounter: Payer: Self-pay | Admitting: Family Medicine

## 2023-02-03 ENCOUNTER — Ambulatory Visit: Payer: 59 | Admitting: Family Medicine

## 2023-02-03 VITALS — BP 138/90 | HR 74 | Temp 98.0°F | Wt 159.2 lb

## 2023-02-03 DIAGNOSIS — M25511 Pain in right shoulder: Secondary | ICD-10-CM | POA: Diagnosis not present

## 2023-02-03 DIAGNOSIS — M533 Sacrococcygeal disorders, not elsewhere classified: Secondary | ICD-10-CM | POA: Diagnosis not present

## 2023-02-03 MED ORDER — HYDROCODONE-ACETAMINOPHEN 10-325 MG PO TABS: 1.0000 | ORAL_TABLET | Freq: Four times a day (QID) | ORAL | 0 refills | Status: DC | PRN

## 2023-02-03 MED ORDER — METHYLPREDNISOLONE ACETATE 80 MG/ML IJ SUSP: 80.0000 mg | Freq: Once | INTRAMUSCULAR | Status: DC

## 2023-02-03 MED ORDER — KETOROLAC TROMETHAMINE 60 MG/2ML IM SOLN
60.0000 mg | Freq: Once | INTRAMUSCULAR | Status: AC
  Administered 2023-02-03: 60 mg via INTRAMUSCULAR

## 2023-02-03 NOTE — Patient Instructions (Addendum)
Return if symptoms worsen or fail to improve.  If requiring additional pain management, return to orthopedic team. They are managing this condition and will need to manage the pain associated.  I have also referred you to pain clinic in the event this is long term pain.         Great to see you today.  I have refilled the medication(s) we provide.   If labs were collected, we will inform you of lab results once received either by echart message or telephone call.   - echart message- for normal results that have been seen by the patient already.   - telephone call: abnormal results or if patient has not viewed results in their echart.

## 2023-02-03 NOTE — Addendum Note (Signed)
Addended by: Beatrix Fetters on: 02/03/2023 04:11 PM   Modules accepted: Orders

## 2023-02-03 NOTE — Progress Notes (Signed)
Alison Vega , 1981/01/15, 42 y.o., female MRN: [id number] Patient Care Team    Relationship Specialty Notifications Start End  Ma Hillock, DO PCP - General Family Medicine  10/01/21   Garnet Sierras, DO Consulting Physician Allergy  11/01/21     Chief Complaint  Patient presents with   Shoulder Pain    Right shoulder down to hand pain; started over 2 weeks ago      Subjective: Alison Vega is a 42 y.o. Pt presents for an OV with complaints of right shoulder pain of 2 weeks duration.  Associated symptoms include pain that radiates down to her hand.  She has been seen at urgent care, then went to the emergency room which treated her with prednisone and naproxen.  She also had a shot of Toradol in the emergency room.  She has since been seen by [facility] team and is planning to have MRIs completed.  They had encouraged her to take naproxen for the pain.  Patient states the pain is not resolved by the naproxen use.     02/03/2023   11:36 AM 05/23/2022    1:17 PM 01/24/2022    8:31 AM 12/17/2021    8:24 AM 11/01/2021    9:30 AM  Depression screen PHQ 2/9  Decreased Interest 0 0 0 0 0  Down, Depressed, Hopeless 0 0 0 0 0  PHQ - 2 Score 0 0 0 0 0  Altered sleeping 0  0    Tired, decreased energy 0  0    Change in appetite 0  0    Feeling bad or failure about yourself  0  0    Trouble concentrating 0  0    Moving slowly or fidgety/restless 0  0    Suicidal thoughts 0  0    PHQ-9 Score 0  0      Allergies  Allergen Reactions   Penicillins Shortness Of Breath and Other (See Comments)    Shut patients throat.   Sulfa Antibiotics Shortness Of Breath and Other (See Comments)    Shuts patient's throat.   Avocado Hives   Social History   Social History Narrative   Marital status/children/pets: Single. G3P3   Education/employment: HS, employed at [employer].    Safety:      -smoke alarm in the home:Yes     - wears seatbelt: Yes     - Feels safe  in their relationships: Yes      Past Medical History:  Diagnosis Date   Asthma    Cellulitis    cellulitis/abcess x15, "every 2 months"   Urticaria    Past Surgical History:  Procedure Laterality Date   CESAREAN SECTION     EYE SURGERY     x3   OPEN REDUCTION INTERNAL FIXATION (ORIF) METACARPAL Right 06/13/2019   Procedure: OPEN TREATMENT OF RIGHT 5TH METACARPAL FRACTURE;  Surgeon: Milly Jakob, MD;  Location: [place];  Service: Orthopedics;  Laterality: Right;   TUBAL LIGATION     Family History  Problem Relation Age of Onset   Cancer Father        unknown type   Lung cancer Father    Diabetes Mother    Asthma Mother    Hepatitis C Mother    Asthma Son    Allergies as of 02/03/2023       Reactions   Penicillins Shortness Of Breath, Other (See Comments)   Shut  patients throat.   Sulfa Antibiotics Shortness Of Breath, Other (See Comments)   Shuts patient's throat.   Avocado Hives        Medication List        Accurate as of February 03, 2023 12:44 PM. If you have any questions, ask your nurse or doctor.          STOP taking these medications    doxycycline 100 MG capsule Commonly known as: VIBRAMYCIN Stopped by: Howard Pouch, DO   famotidine 40 MG tablet Commonly known as: PEPCID Stopped by: Howard Pouch, DO   HYDROcodone-acetaminophen 5-325 MG tablet Commonly known as: NORCO/VICODIN Replaced by: HYDROcodone-acetaminophen 10-325 MG tablet Stopped by: Howard Pouch, DO   predniSONE 10 MG tablet Commonly known as: DELTASONE Stopped by: Howard Pouch, DO       TAKE these medications    doxepin 50 MG capsule Commonly known as: SINEQUAN Take 1 capsule (50 mg total) by mouth at bedtime.   EPINEPHrine 0.3 mg/0.3 mL Soaj injection Commonly known as: EPI-PEN Inject 0.3 mg into the muscle as needed for anaphylaxis.   fluticasone 50 MCG/ACT nasal spray Commonly known as: FLONASE Place 1-2 sprays into both nostrils daily as needed  for rhinitis.   HYDROcodone-acetaminophen 10-325 MG tablet Commonly known as: NORCO Take 1 tablet by mouth every 6 (six) hours as needed for up to 5 days. Replaces: HYDROcodone-acetaminophen 5-325 MG tablet Started by: Howard Pouch, DO   hydrOXYzine 50 MG capsule Commonly known as: Vistaril Take 1-2 capsules (50-100 mg total) by mouth at bedtime as needed.   levocetirizine 5 MG tablet Commonly known as: XYZAL Take 1 tablet (5 mg total) by mouth in the morning and at bedtime.   lidocaine 5 % Commonly known as: Lidoderm Place 1 patch onto the skin daily. Remove & Discard patch within 12 hours   naproxen 500 MG tablet Commonly known as: NAPROSYN Take 1 tablet (500 mg total) by mouth 2 (two) times daily.   naproxen sodium 550 MG tablet Commonly known as: Anaprox DS Take 1 tablet (550 mg total) by mouth 2 (two) times daily with a meal.   Risperidone 0.25 MG Tbdp Take 1 tablet (0.25 mg total) by mouth daily.   solifenacin 5 MG tablet Commonly known as: VESIcare Take 1 tablet (5 mg total) by mouth daily.   tiZANidine 4 MG tablet Commonly known as: ZANAFLEX Take 4 mg by mouth every 8 (eight) hours as needed.   tiZANidine 4 MG tablet Commonly known as: Zanaflex Take 1 tablet (4 mg total) by mouth every 6 (six) hours as needed for muscle spasms.   Trelegy Ellipta 200-62.5-25 MCG/ACT Aepb Generic drug: Fluticasone-Umeclidin-Vilant Inhale 1 puff into the lungs daily. Rinse mouth after each use.   Vitamin D (Ergocalciferol) 1.25 MG (50000 UNIT) Caps capsule Commonly known as: DRISDOL Take 1 capsule (50,000 Units total) by mouth every 7 (seven) days.        All past medical history, surgical history, allergies, family history, immunizations andmedications were updated in the EMR today and reviewed under the history and medication portions of their EMR.     ROS Negative, with the exception of above mentioned in HPI   Objective:  BP (!) 138/90   Pulse 74   Temp 98 F  (36.7 C)   Wt 159 lb 3.2 oz (72.2 kg)   LMP  (LMP Unknown)   SpO2 99%   BMI 29.12 kg/m  Body mass index is 29.12 kg/m. Physical Exam Vitals and nursing note  reviewed.  Constitutional:      General: She is not in acute distress.    Appearance: Normal appearance. She is normal weight. She is not ill-appearing or toxic-appearing.  HENT:     Head: Normocephalic and atraumatic.  Eyes:     General: No scleral icterus.       Right eye: No discharge.        Left eye: No discharge.     Extraocular Movements: Extraocular movements intact.     Conjunctiva/sclera: Conjunctivae normal.     Pupils: Pupils are equal, round, and reactive to light.  Musculoskeletal:        General: Tenderness and signs of injury present.  Skin:    Findings: No rash.  Neurological:     Mental Status: She is alert and oriented to person, place, and time. Mental status is at baseline.     Motor: No weakness.     Coordination: Coordination normal.     Gait: Gait normal.  Psychiatric:        Mood and Affect: Mood normal.        Behavior: Behavior normal.        Thought Content: Thought content normal.        Judgment: Judgment normal.      No results found. No results found. No results found for this or any previous visit (from the past 24 hour(s)).  Assessment/Plan: Harnoor Shillington is a 42 y.o. female present for OV for  Acute pain of right shoulder/Sacroiliac joint dysfunction of right side I agreed to go ahead and provide patient with a short-term prescription for her pain.  However, moving forward her pain needs to be treated by the treating physician for the cause of the pain.  Will not be able to refill or supply her further opioid prescription. Patient unfortunately is without insurance currently and uncertain if she is going to be able to move forward with the MRI as fast as she had desired. Discussed ambulatory referral to pain clinic for her chronic pain may be warranted if she is unable to move  forward with orthopedic treatment plan.  Patient understands. - HYDROcodone-acetaminophen (NORCO) 10-325 MG tablet; Take 1 tablet by mouth every 6 (six) hours as needed for up to 5 days.  Dispense: 30 tablet; Refill: 0 - Ambulatory referral to Pain Clinic    Reviewed expectations re: course of current medical issues. Discussed self-management of symptoms. Outlined signs and symptoms indicating need for more acute intervention. Patient verbalized understanding and all questions were answered. Patient received an After-Visit Summary.    Orders Placed This Encounter  Procedures   Ambulatory referral to Pain Clinic   Meds ordered this encounter  Medications   HYDROcodone-acetaminophen (NORCO) 10-325 MG tablet    Sig: Take 1 tablet by mouth every 6 (six) hours as needed for up to 5 days.    Dispense:  30 tablet    Refill:  0   Referral Orders         Ambulatory referral to Pain Clinic       Note is dictated utilizing voice recognition software. Although note has been proof read prior to signing, occasional typographical errors still can be missed. If any questions arise, please do not hesitate to call for verification.   electronically signed by:  Howard Pouch, DO  [place]

## 2023-02-16 ENCOUNTER — Encounter: Payer: Self-pay | Admitting: *Deleted

## 2023-02-20 DIAGNOSIS — M542 Cervicalgia: Secondary | ICD-10-CM | POA: Diagnosis not present

## 2023-02-27 ENCOUNTER — Telehealth: Payer: Self-pay | Admitting: *Deleted

## 2023-02-27 NOTE — Telephone Encounter (Signed)
L/m for patient needs MD appt for Xolair reapproval °

## 2023-03-09 ENCOUNTER — Other Ambulatory Visit: Payer: Self-pay

## 2023-03-09 ENCOUNTER — Telehealth: Payer: Self-pay

## 2023-03-09 MED ORDER — VITAMIN D (ERGOCALCIFEROL) 1.25 MG (50000 UNIT) PO CAPS: 50000.0000 [IU] | ORAL_CAPSULE | ORAL | 0 refills | Status: DC

## 2023-03-09 MED ORDER — HYDROXYZINE PAMOATE 50 MG PO CAPS: 50.0000 mg | ORAL_CAPSULE | Freq: Every evening | ORAL | 0 refills | Status: DC | PRN

## 2023-03-09 NOTE — Telephone Encounter (Signed)
RF request for drisdol LOV: 02/03/23 Next ov: n/a Last written: 12/19/21 (12,4)  RF request for vistaril  LOV: 02/03/23 Next ov: n/a Last written: 12/17/21 (180,3)

## 2023-03-09 NOTE — Telephone Encounter (Signed)
Patient refill request.  [facility] - [address]  Vitamin D, Ergocalciferol, (DRISDOL) 1.25 MG (50000 UNIT) CAPS capsule   hydrOXYzine (VISTARIL) 50 MG capsule

## 2023-03-18 ENCOUNTER — Telehealth: Payer: Self-pay | Admitting: Family Medicine

## 2023-03-18 NOTE — Telephone Encounter (Signed)
Patient reports that she would like her Risperidone 0.25 MG TBDP  to now go to the [facility] on [address] in [place].  I informed her that she can call the pharmacy as well and have it transferred.

## 2023-03-18 NOTE — Telephone Encounter (Signed)
noted 

## 2023-03-19 ENCOUNTER — Telehealth: Payer: Self-pay

## 2023-03-19 ENCOUNTER — Other Ambulatory Visit (HOSPITAL_COMMUNITY): Payer: Self-pay

## 2023-03-19 NOTE — Telephone Encounter (Signed)
Patient shows 2 active insurance plans, neither require PA. Patient has a deductible to meet causing high co-pays  [employer]-$343.51 [employer]-$617.56

## 2023-03-19 NOTE — Telephone Encounter (Signed)
Pts insurance needs pa for trelegy please and thank you

## 2023-03-19 NOTE — Telephone Encounter (Signed)
Does it happen to tell you what might be covered at lesser amount?

## 2023-03-20 ENCOUNTER — Other Ambulatory Visit (HOSPITAL_COMMUNITY): Payer: Self-pay

## 2023-03-20 NOTE — Telephone Encounter (Signed)
Patient may have to use multiple inhalers to achieve triple therapy.  ICS/LABA Wixela/generic Advair Diskus [employer]: $40.00 [employer]: $30.00  LAMA Generic SpirivaHandihaler [employer]: $40.00 [employer]: $30.00

## 2023-03-22 NOTE — Telephone Encounter (Signed)
Please call patient and have her schedule a follow to discuss inhaler options as she is due for OV.  It doesn't have to be with me as I'll be out of office for the next few weeks.  She can see Thurston Hole or Chrissie in OR as they will be covering the OR office while I'm out.   Thank you.

## 2023-03-23 NOTE — Telephone Encounter (Signed)
Called and spoke to patient and informed her of the note from Dr. Selena Batten. Patient expressed that she would call our office back as her car is not working so she is relying on her sister for help. Patient will call our office back to schedule her follow up appointment.

## 2023-03-26 DIAGNOSIS — M5412 Radiculopathy, cervical region: Secondary | ICD-10-CM | POA: Diagnosis not present

## 2023-04-13 ENCOUNTER — Other Ambulatory Visit (HOSPITAL_COMMUNITY): Payer: Self-pay

## 2023-04-16 DIAGNOSIS — M5412 Radiculopathy, cervical region: Secondary | ICD-10-CM | POA: Diagnosis not present

## 2023-04-30 IMAGING — CR DG HAND COMPLETE 3+V*L*
3 series · 3 of 3 positions shown · non-contrast
Comparison: None.

CLINICAL DATA: Fall, left hand pain

EXAM:
LEFT HAND - COMPLETE 3+ VIEW

[hand pa]
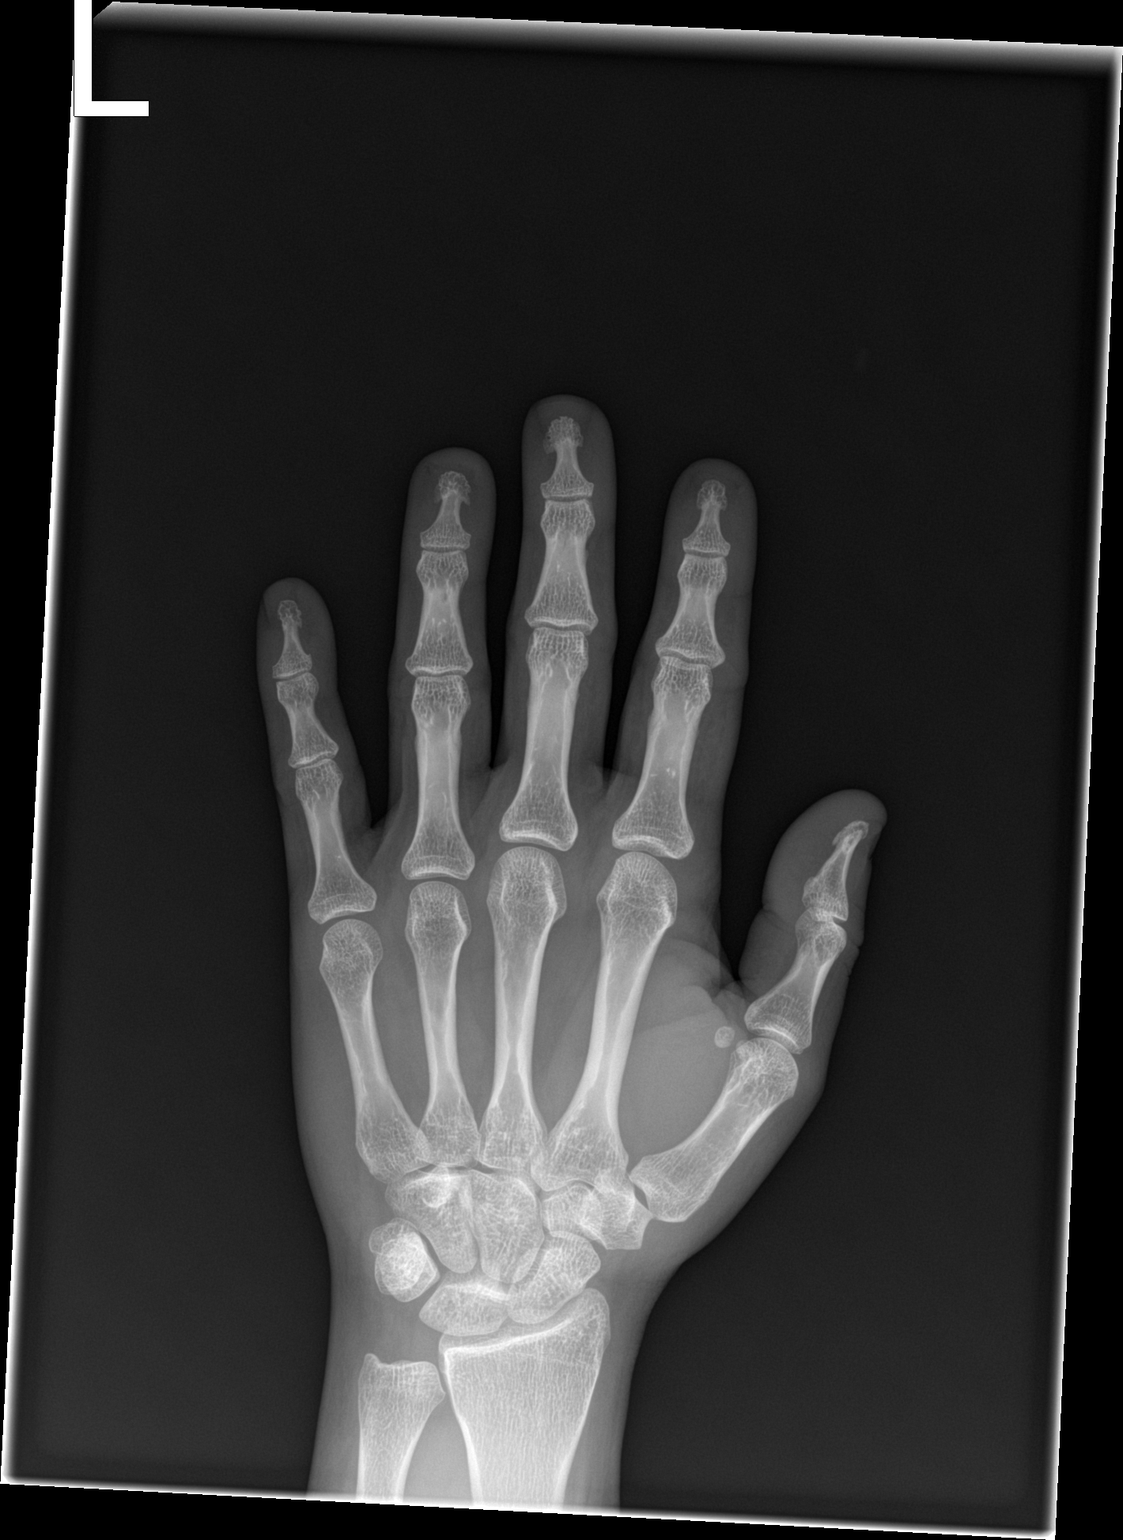

[hand obl]
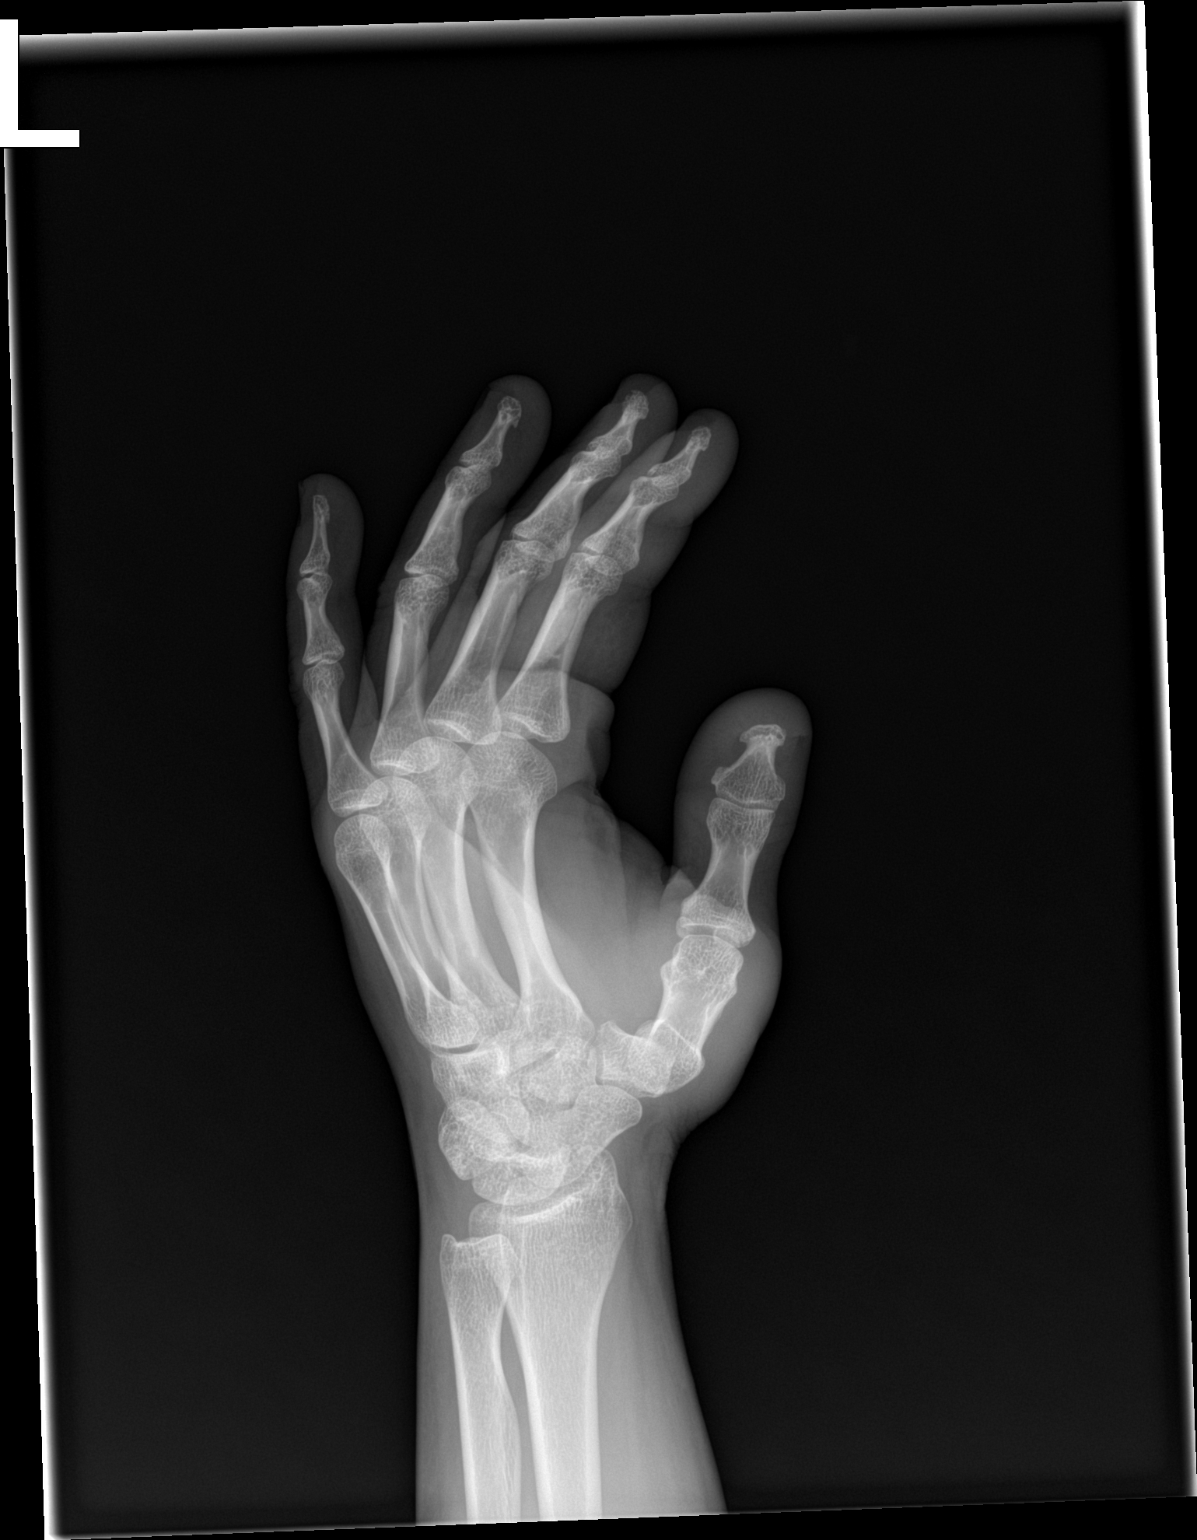

[hand lat]
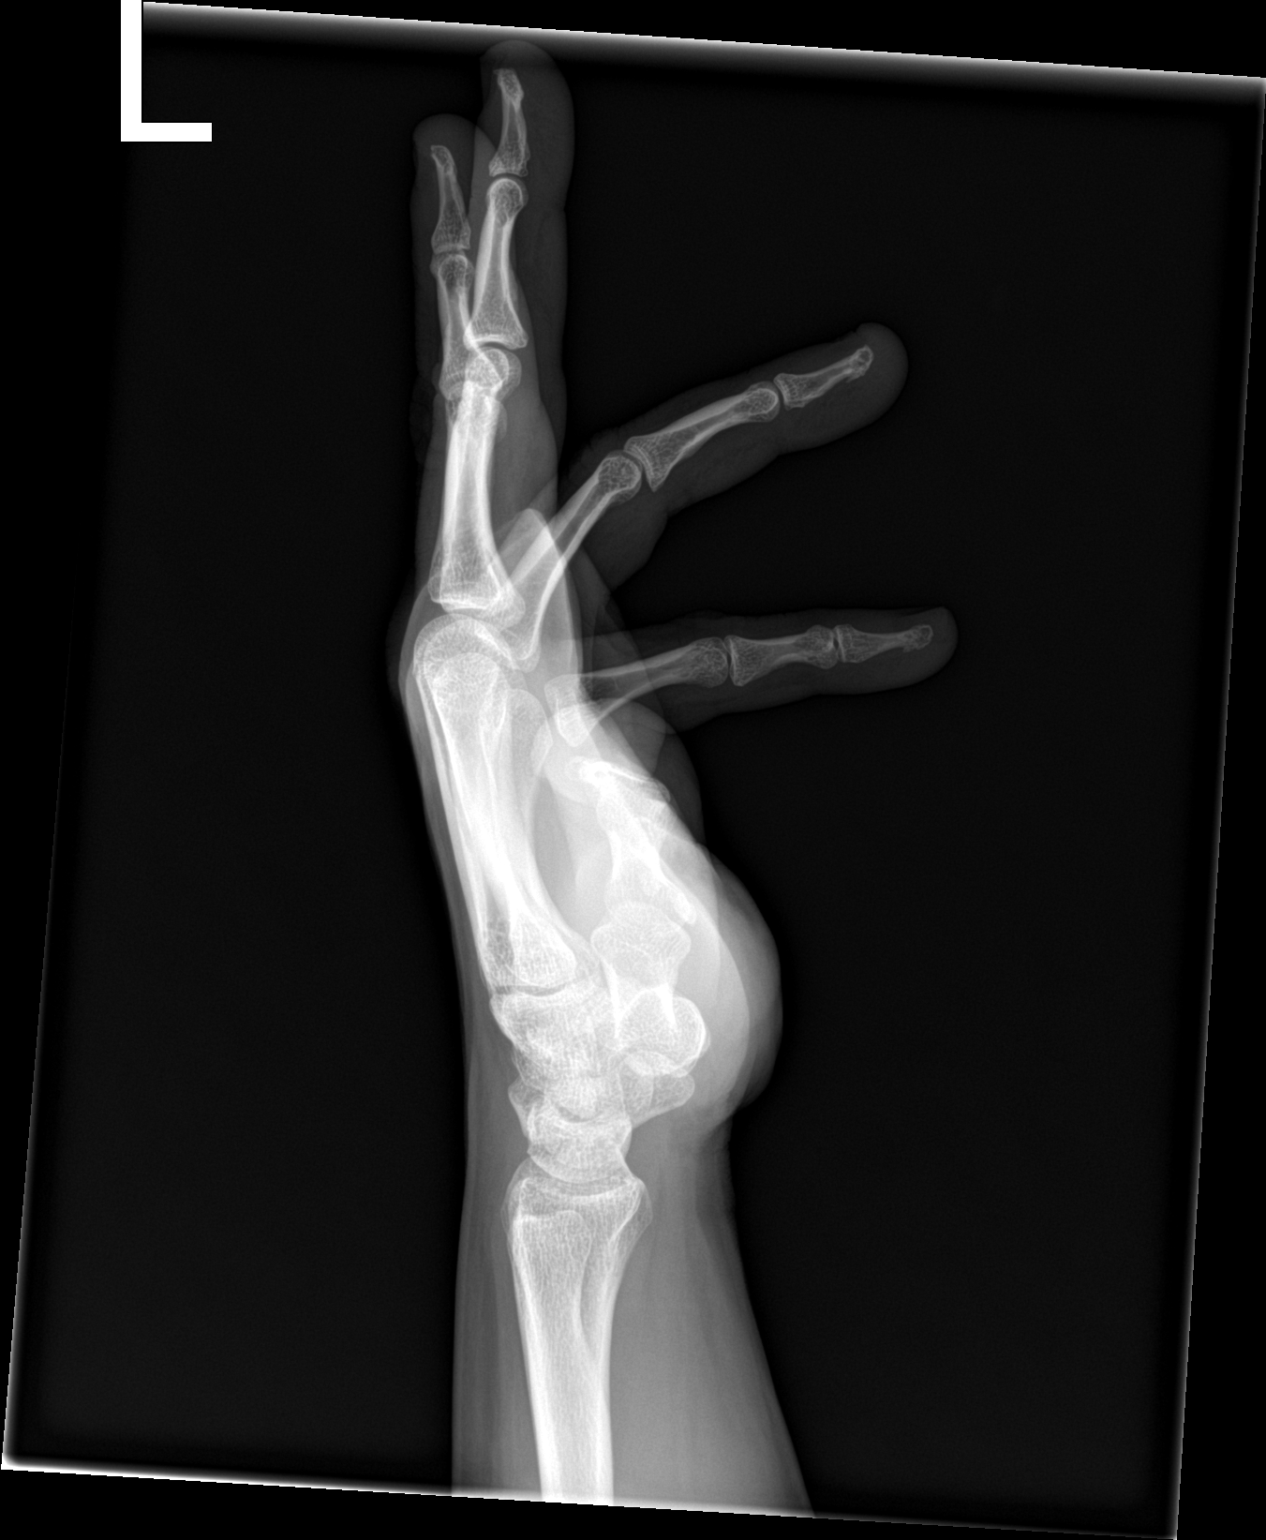

[3 of 3 positions shown; findings below may reference images not displayed]

FINDINGS: There is no evidence of fracture or dislocation. There is no
evidence of arthropathy or other focal bone abnormality. Soft
tissues are unremarkable.
IMPRESSION: Negative.

## 2023-05-01 ENCOUNTER — Telehealth: Payer: Self-pay

## 2023-05-01 NOTE — Telephone Encounter (Signed)
Request Reference Number: [id number]. RISPERIDONE TAB 0.25 ODT is approved through 04/30/2024.   For further questions, call [employer] at [phone]. Authorization Expiration Date: 04/30/2024

## 2023-05-01 NOTE — Telephone Encounter (Signed)
Alison Vega (Alison Vega) PA Case ID #: [id number] Rx #: [id number] Need Help? Call us at [phone]  Status Sent to Plan today  Waiting for insurance determination.

## 2023-05-04 ENCOUNTER — Other Ambulatory Visit (HOSPITAL_COMMUNITY): Payer: Self-pay

## 2023-05-12 NOTE — Telephone Encounter (Signed)
RECEIVED FAX THAT PA IS NEEDED FOR TRELEGY PLEASE ADVISE TO PA THANK YOU

## 2023-05-13 ENCOUNTER — Other Ambulatory Visit (HOSPITAL_COMMUNITY): Payer: Self-pay

## 2023-05-13 NOTE — Telephone Encounter (Signed)
Hey she only has medicaid not [place]

## 2023-05-13 NOTE — Telephone Encounter (Signed)
PA not needed, patient has a deductible to meet causing a high co-pays

## 2023-05-14 ENCOUNTER — Other Ambulatory Visit (HOSPITAL_COMMUNITY): Payer: Self-pay

## 2023-05-14 NOTE — Telephone Encounter (Signed)
Trelegy is not covered preferred's are Brand Advair Diskus: $4.00 Brand Spiriva Handihaler: $4.00

## 2023-05-14 NOTE — Telephone Encounter (Signed)
Alison Vega, can you check if the Trelegy coupon works for her?   [email or web address]

## 2023-05-14 NOTE — Telephone Encounter (Signed)
Patient has [facility] now, [employer]s is still showing active as well. Under the [facility] Plan Trelegy is not covered but the alternatives of two inhalers are: Brand Advair Diskus: $4.00 Brand Spiriva Handihaler: $4.00

## 2023-05-14 NOTE — Telephone Encounter (Signed)
She does not qualify due to only having medicaid now

## 2023-05-14 NOTE — Telephone Encounter (Signed)
Can you please call patient and have her come in for a follow up to discuss further about her inhaler changes? She is due for OV.   I have an opening today at 4pm.  I also have Trelegy samples in the OR office to give her while we try to figure what inhalers to use next out.  Thank you.

## 2023-05-15 ENCOUNTER — Telehealth: Payer: Self-pay

## 2023-05-15 ENCOUNTER — Other Ambulatory Visit (HOSPITAL_COMMUNITY): Payer: Self-pay

## 2023-05-15 NOTE — Telephone Encounter (Signed)
Pt informed and schduled for tues 315

## 2023-05-15 NOTE — Telephone Encounter (Signed)
Pharmacy Patient Advocate Encounter   Received notification from CoverMyMeds that prior authorization for Trelegy Ellipta 200-62.5-25MCG/ACT aerosol powder is required/requested.  PA submitted to [facility] via CoverMyMeds Key/confirmation #/EOC : [id number] Status is pending

## 2023-05-15 NOTE — Telephone Encounter (Signed)
Lm for pt to call us back about scheduling an appt 

## 2023-05-18 NOTE — Telephone Encounter (Signed)
Pharmacy Patient Advocate Encounter  Received notification from [facility] Prior Authorization for Trelegy Ellipta 200-62.5-25MCG/ACT has been DENIED because  .Marland Kitchen    PA #/Case ID/Reference #:  [id number] Key: [id number]   Please be advised we currently do not have a Pharmacist to review denials, therefore you will need to process appeals accordingly as needed. Thanks for your support at this time. Contact for appeals are as follows: Phone: [phone], Fax: [phone] Last day to appeal is 07-14-2023

## 2023-05-19 ENCOUNTER — Ambulatory Visit (INDEPENDENT_AMBULATORY_CARE_PROVIDER_SITE_OTHER): Payer: Medicaid Other | Admitting: Allergy

## 2023-05-19 ENCOUNTER — Encounter: Payer: Self-pay | Admitting: Allergy

## 2023-05-19 ENCOUNTER — Other Ambulatory Visit: Payer: Self-pay

## 2023-05-19 VITALS — BP 130/88 | HR 87 | Temp 97.7°F | Resp 18 | Ht 62.0 in | Wt 164.8 lb

## 2023-05-19 DIAGNOSIS — L501 Idiopathic urticaria: Secondary | ICD-10-CM | POA: Diagnosis not present

## 2023-05-19 DIAGNOSIS — J310 Chronic rhinitis: Secondary | ICD-10-CM | POA: Diagnosis not present

## 2023-05-19 DIAGNOSIS — J4540 Moderate persistent asthma, uncomplicated: Secondary | ICD-10-CM

## 2023-05-19 MED ORDER — FLUTICASONE PROPIONATE 50 MCG/ACT NA SUSP: 1.0000 | Freq: Every day | NASAL | 5 refills | Status: DC | PRN

## 2023-05-19 MED ORDER — FAMOTIDINE 20 MG PO TABS: 20.0000 mg | ORAL_TABLET | Freq: Two times a day (BID) | ORAL | 3 refills | Status: AC

## 2023-05-19 MED ORDER — MOMETASONE FURO-FORMOTEROL FUM 200-5 MCG/ACT IN AERO: 2.0000 | INHALATION_SPRAY | Freq: Two times a day (BID) | RESPIRATORY_TRACT | 3 refills | Status: DC

## 2023-05-19 MED ORDER — SPIRIVA RESPIMAT 1.25 MCG/ACT IN AERS: 2.0000 | INHALATION_SPRAY | Freq: Every day | RESPIRATORY_TRACT | 3 refills | Status: DC

## 2023-05-19 MED ORDER — LEVOCETIRIZINE DIHYDROCHLORIDE 5 MG PO TABS: 5.0000 mg | ORAL_TABLET | Freq: Two times a day (BID) | ORAL | 3 refills | Status: DC | PRN

## 2023-05-19 MED ORDER — ALBUTEROL SULFATE HFA 108 (90 BASE) MCG/ACT IN AERS: 2.0000 | INHALATION_SPRAY | RESPIRATORY_TRACT | 1 refills | Status: DC | PRN

## 2023-05-19 NOTE — Assessment & Plan Note (Signed)
Past history - Lab evaluation unremarkable (FCeRI antibody, anti-thyroglobulin antibody, thyroid peroxidase antibody, tryptase, H. pylori serology, CBC, CMP, ESR, ANA, and alpha-gal panel). On Xolair in the past.  Interim history -  only had the sample injection in November due to change in her insurance. Still breaks out a few times per month.  Plan on restarting Xolair 300mg  every 4 weeks. Take the following medications:  Doxepin 50mg  once a day at night.  Xyzal (levocetirizine) 5mg  once a day in the morning.  Pepcid (famotidine) 20mg  twice a day. Avoid the following potential triggers: alcohol, tight clothing, NSAIDs, hot showers and getting overheated.

## 2023-05-19 NOTE — Assessment & Plan Note (Signed)
Past history - normal CXR in 2021. Symbicort and Breo too expensive. Prednisone in December 2022.  Interim history - doing well with Trelegy but insurance no longer wants to cover it.  Today's spirometry was normal.  Daily controller medication(s): stop Trelegy. Start Dulera 2 puffs twice a day with spacer and rinse mouth afterwards. Start Spiriva 2 puffs once a day. Demonstrated proper use.  If too expensive let me know.  If not as effective - will need to do PA for Trelegy next.  May use albuterol rescue inhaler 2-4 puffs every 4 to 6 hours as needed for shortness of breath, chest tightness, coughing, and wheezing. May use albuterol rescue inhaler 2 puffs 5 to 15 minutes prior to strenuous physical activities. Monitor frequency of use.  Get spirometry at next visit.

## 2023-05-19 NOTE — Assessment & Plan Note (Signed)
Past history - 2020 skin testing negative to pediatric environmental panel.  Use Flonase (fluticasone) nasal spray 1 spray per nostril twice a day as needed for nasal congestion.  Nasal saline spray (i.e., Simply Saline) or nasal saline lavage (i.e., NeilMed) is recommended as needed and prior to medicated nasal sprays. May take Xyzal (levocetirizine) 5mg  1-2 times daily as needed.

## 2023-05-19 NOTE — Progress Notes (Signed)
Follow Up Note  RE: Alison Vega MRN: [id number] DOB: [birth year] Date of Office Visit: 05/19/2023  Referring provider: Natalia Leatherwood, DO Primary care provider: Natalia Leatherwood, DO  Chief Complaint: Urticaria (Some flares OTC allergy medication has been helping ) and Asthma (Some flares with the heat - trelegy is not covered by her insurance )  History of Present Illness: I had the pleasure of seeing Alison Vega for a follow up visit at the [facility] on 05/19/2023. She is a 42 y.o. female, who is being followed for CIU, asthma, nonallergic rhinitis. Her previous allergy office visit was on 09/23/2022 with Dr. Selena Batten. Today is a regular follow up visit.  Chronic idiopathic urticaria Patient only got one Xolair injection due to insurance coverage change.  Still breaking out a few times per month but not as bad as before.  Currently not taking all her antihistamines daily due to cost. Yesterday she took some OTC antihistamine.  Takes doxepin 50mg  once a day at night.   Would like to start Xolair if insurance covers.  Moderate persistent asthma  Insurance won't cover Trelegy Still taking her leftover Trelegy 1 puff once a day. Using albuterol on rare occasions with the hot heat.  Denies any ER/urgent care visits or prednisone use since the last visit.   Nonallergic rhinitis Stable.   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 She also lives in the home and has no personal transportation.   Assessment and Plan: Alison Vega is a 42 y.o. female with: Chronic idiopathic urticaria Past history - Lab evaluation unremarkable (FCeRI antibody, anti-thyroglobulin antibody, thyroid peroxidase antibody, tryptase, H. pylori serology, CBC, CMP, ESR, ANA, and alpha-gal panel). On Xolair in the past.  Interim history -  only had the sample injection in November due to change in her insurance. Still breaks out a few times per month.  Plan  on restarting Xolair 300mg  every 4 weeks. Take the following medications:  Doxepin 50mg  once a day at night.  Xyzal (levocetirizine) 5mg  once a day in the morning.  Pepcid (famotidine) 20mg  twice a day. Avoid the following potential triggers: alcohol, tight clothing, NSAIDs, hot showers and getting overheated.  Moderate persistent asthma without complication Past history - normal CXR in 2021. Symbicort and Breo too expensive. Prednisone in December 2022.  Interim history - doing well with Trelegy but insurance no longer wants to cover it.  Today's spirometry was normal.  Daily controller medication(s): stop Trelegy. Start Dulera 2 puffs twice a day with spacer and rinse mouth afterwards. Start Spiriva 2 puffs once a day. Demonstrated proper use.  If too expensive let me know.  If not as effective - will need to do PA for Trelegy next.  May use albuterol rescue inhaler 2-4 puffs every 4 to 6 hours as needed for shortness of breath, chest tightness, coughing, and wheezing. May use albuterol rescue inhaler 2 puffs 5 to 15 minutes prior to strenuous physical activities. Monitor frequency of use.  Get spirometry at next visit.  Nonallergic rhinitis Past history - 2020 skin testing negative to pediatric environmental panel.  Use Flonase (fluticasone) nasal spray 1 spray per nostril twice a day as needed for nasal congestion.  Nasal saline spray (i.e., Simply Saline) or nasal saline lavage (i.e., NeilMed) is recommended as needed and prior to medicated nasal sprays. May take Xyzal (levocetirizine) 5mg  1-2 times daily as needed.   Return in about 2 months (around  07/20/2023).  Meds ordered this encounter  Medications   fluticasone (FLONASE) 50 MCG/ACT nasal spray    Sig: Place 1-2 sprays into both nostrils daily as needed for rhinitis.    Dispense:  16 g    Refill:  5   mometasone-formoterol (DULERA) 200-5 MCG/ACT AERO    Sig: Inhale 2 puffs into the lungs in the morning and at  bedtime. with spacer and rinse mouth afterwards.    Dispense:  1 each    Refill:  3   Tiotropium Bromide Monohydrate (SPIRIVA RESPIMAT) 1.25 MCG/ACT AERS    Sig: Inhale 2 puffs into the lungs daily.    Dispense:  4 g    Refill:  3   albuterol (VENTOLIN HFA) 108 (90 Base) MCG/ACT inhaler    Sig: Inhale 2 puffs into the lungs every 4 (four) hours as needed for wheezing or shortness of breath (coughing fits).    Dispense:  18 g    Refill:  1   levocetirizine (XYZAL) 5 MG tablet    Sig: Take 1 tablet (5 mg total) by mouth 2 (two) times daily as needed (hives).    Dispense:  60 tablet    Refill:  3   famotidine (PEPCID) 20 MG tablet    Sig: Take 1 tablet (20 mg total) by mouth 2 (two) times daily.    Dispense:  60 tablet    Refill:  3   Lab Orders  No laboratory test(s) ordered today    Diagnostics: Spirometry:  Tracings reviewed. Her effort: Good reproducible efforts. FVC: 3.29L FEV1: 3.01L, 106% predicted FEV1/FVC ratio: 91% Interpretation: Spirometry consistent with normal pattern.  Please see scanned spirometry results for details.  Medication List:  Current Outpatient Medications  Medication Sig Dispense Refill   albuterol (VENTOLIN HFA) 108 (90 Base) MCG/ACT inhaler Inhale 2 puffs into the lungs every 4 (four) hours as needed for wheezing or shortness of breath (coughing fits). 18 g 1   doxepin (SINEQUAN) 50 MG capsule Take 1 capsule (50 mg total) by mouth at bedtime. 30 capsule 5   EPINEPHrine 0.3 mg/0.3 mL IJ SOAJ injection Inject 0.3 mg into the muscle as needed for anaphylaxis. 2 each 1   famotidine (PEPCID) 20 MG tablet Take 1 tablet (20 mg total) by mouth 2 (two) times daily. 60 tablet 3   hydrOXYzine (VISTARIL) 50 MG capsule Take 1-2 capsules (50-100 mg total) by mouth at bedtime as needed. OFFICE VISIT NEEDED FOR FURTHER REFILLS 60 capsule 0   levocetirizine (XYZAL) 5 MG tablet Take 1 tablet (5 mg total) by mouth 2 (two) times daily as needed (hives). 60 tablet 3    lidocaine (LIDODERM) 5 % Place 1 patch onto the skin daily. Remove & Discard patch within 12 hours 30 patch 0   mometasone-formoterol (DULERA) 200-5 MCG/ACT AERO Inhale 2 puffs into the lungs in the morning and at bedtime. with spacer and rinse mouth afterwards. 1 each 3   naproxen sodium (ANAPROX DS) 550 MG tablet Take 1 tablet (550 mg total) by mouth 2 (two) times daily with a meal. 20 tablet 0   Risperidone 0.25 MG TBDP Take 1 tablet (0.25 mg total) by mouth daily. 30 tablet 5   solifenacin (VESICARE) 5 MG tablet Take 1 tablet (5 mg total) by mouth daily. 30 tablet 5   Tiotropium Bromide Monohydrate (SPIRIVA RESPIMAT) 1.25 MCG/ACT AERS Inhale 2 puffs into the lungs daily. 4 g 3   tiZANidine (ZANAFLEX) 4 MG tablet Take 4 mg by mouth every  8 (eight) hours as needed.     tiZANidine (ZANAFLEX) 4 MG tablet Take 1 tablet (4 mg total) by mouth every 6 (six) hours as needed for muscle spasms. 30 tablet 0   Vitamin D, Ergocalciferol, (DRISDOL) 1.25 MG (50000 UNIT) CAPS capsule Take 1 capsule (50,000 Units total) by mouth every 7 (seven) days. OFFICE VISIT NEEDED FOR FURTHER REFILLS 4 capsule 0   fluticasone (FLONASE) 50 MCG/ACT nasal spray Place 1-2 sprays into both nostrils daily as needed for rhinitis. 16 g 5   HYDROcodone-acetaminophen (NORCO) 10-325 MG tablet Take 1 tablet by mouth every 6 (six) hours as needed for up to 5 days. 30 tablet 0   Current Facility-Administered Medications  Medication Dose Route Frequency Provider Last Rate Last Admin   omalizumab [name]) injection 300 mg  300 mg Subcutaneous Q28 days Ellamae Sia, DO   300 mg at 12/16/22 1402   Allergies: Allergies  Allergen Reactions   Penicillins Shortness Of Breath and Other (See Comments)    Shut patients throat.   Sulfa Antibiotics Shortness Of Breath and Other (See Comments)    Shuts patient's throat.   Avocado Hives   I reviewed her past medical history, social history, family history, and environmental history and no  significant changes have been reported from her previous visit.  Review of Systems  Constitutional:  Negative for appetite change, chills, fever and unexpected weight change.  HENT:  Negative for congestion and rhinorrhea.   Eyes:  Negative for itching.  Respiratory:  Negative for cough, chest tightness, shortness of breath and wheezing.   Gastrointestinal:  Negative for abdominal pain.  Skin:  Positive for rash.       pruritus  Allergic/Immunologic: Negative for environmental allergies and food allergies.  Neurological:  Negative for headaches.    Objective: BP 130/88   Pulse 87   Temp 97.7 F (36.5 C)   Resp 18   Ht 5\' 2"  (1.575 m)   Wt 164 lb 12.8 oz (74.8 kg)   SpO2 95%   BMI 30.14 kg/m  Body mass index is 30.14 kg/m. Physical Exam Vitals and nursing note reviewed.  Constitutional:      Appearance: Normal appearance. She is well-developed.  HENT:     Head: Normocephalic and atraumatic.     Right Ear: Tympanic membrane and external ear normal.     Left Ear: Tympanic membrane and external ear normal.     Nose: Nose normal.     Mouth/Throat:     Mouth: Mucous membranes are moist.     Pharynx: Oropharynx is clear.  Eyes:     Conjunctiva/sclera: Conjunctivae normal.  Cardiovascular:     Rate and Rhythm: Normal rate and regular rhythm.     Heart sounds: Normal heart sounds. No murmur heard.    No friction rub. No gallop.  Pulmonary:     Effort: Pulmonary effort is normal.     Breath sounds: Normal breath sounds. No wheezing or rales.  Musculoskeletal:     Cervical back: Neck supple.  Skin:    General: Skin is warm.     Findings: No rash.     Comments: No hives today.  Neurological:     Mental Status: She is alert and oriented to person, place, and time.  Psychiatric:        Behavior: Behavior normal.    Previous notes and tests were reviewed. The plan was reviewed with the patient/family, and all questions/concerned were addressed.  It was my pleasure to  see Alison Vega today and participate in her care. Please feel free to contact me with any questions or concerns.  Sincerely,  Wyline Mood, DO Allergy & Immunology  [facility] office: [phone] [facility] office: [phone]

## 2023-05-19 NOTE — Patient Instructions (Addendum)
Let me know if medications are too expensive.   Urticaria Plan on restarting Xolair 300mg  every 4 weeks - Tammy will be in touch with you.  Take the following medications:  Doxepin 50mg  once a day at night.  Xyzal (levocetirizine) 5mg  once a day in the morning.  Pepcid (famotidine) 20mg  twice a day. Avoid the following potential triggers: alcohol, tight clothing, NSAIDs, hot showers and getting overheated.  Asthma  Today's spirometry was normal.  Daily controller medication(s): stop Trelegy. Start Dulera 2 puffs twice a day with spacer and rinse mouth afterwards. Start Spiriva 2 puffs once a day. If too expensive let me know.  Must try for 2 months and if not working well will switch back to Trelegy.  May use albuterol rescue inhaler 2-4 puffs every 4 to 6 hours as needed for shortness of breath, chest tightness, coughing, and wheezing. May use albuterol rescue inhaler 2 puffs 5 to 15 minutes prior to strenuous physical activities. Monitor frequency of use.  Asthma control goals:  Full participation in all desired activities (may need albuterol before activity) Albuterol use two times or less a week on average (not counting use with activity) Cough interfering with sleep two times or less a month Oral steroids no more than once a year No hospitalizations  Chronic rhinitis Use Flonase (fluticasone) nasal spray 1 spray per nostril twice a day as needed for nasal congestion.  Nasal saline spray (i.e., Simply Saline) or nasal saline lavage (i.e., NeilMed) is recommended as needed and prior to medicated nasal sprays. May take Xyzal (levocetirizine) 5mg  1-2 times daily as needed.    Follow up in 2 months or sooner if needed.   If you run out of your medications before the next visit and you can't afford it then call our office so we can get you samples if we have them.

## 2023-05-25 ENCOUNTER — Telehealth: Payer: Self-pay

## 2023-05-25 NOTE — Telephone Encounter (Signed)
Was seen for an acute concern of shoulder pain in April.   Patient requiring chronic pain medications for this concern she would need to be seen by pain management and orthopedics.

## 2023-05-25 NOTE — Telephone Encounter (Signed)
Patient scheduled appt for tomorrow for f/u pain.  Prescription Request  05/25/2023  LOV: Visit date not found  What is the name of the medication or equipment?   HYDROcodone-acetaminophen (NORCO) 10-325 MG tablet    Have you contacted your pharmacy to request a refill? No   Which pharmacy would you like this sent to?  [facility] [phone] - [place],  - [address] AT [facility] [address] [place] [address] Phone: [phone] Fax: [phone]  [id number] (INTERNAL USE ONLY) - [place], [place] - [address] [place] [address] Phone: [phone] Fax: [phone]    Patient notified that their request is being sent to the clinical staff for review and that they should receive a response within 2 business days.   Please advise at Mobile [phone] (mobile)

## 2023-05-26 ENCOUNTER — Encounter: Payer: Self-pay | Admitting: Family Medicine

## 2023-05-26 ENCOUNTER — Telehealth (INDEPENDENT_AMBULATORY_CARE_PROVIDER_SITE_OTHER): Payer: Medicaid Other | Admitting: Family Medicine

## 2023-05-26 VITALS — Wt 168.2 lb

## 2023-05-26 DIAGNOSIS — M79601 Pain in right arm: Secondary | ICD-10-CM | POA: Diagnosis not present

## 2023-05-26 DIAGNOSIS — M542 Cervicalgia: Secondary | ICD-10-CM

## 2023-05-26 DIAGNOSIS — G8929 Other chronic pain: Secondary | ICD-10-CM

## 2023-05-26 DIAGNOSIS — M546 Pain in thoracic spine: Secondary | ICD-10-CM | POA: Diagnosis not present

## 2023-05-26 DIAGNOSIS — M79602 Pain in left arm: Secondary | ICD-10-CM | POA: Diagnosis not present

## 2023-05-26 MED ORDER — DICLOFENAC SODIUM 75 MG PO TBEC: 75.0000 mg | DELAYED_RELEASE_TABLET | Freq: Two times a day (BID) | ORAL | 5 refills | Status: DC

## 2023-05-26 MED ORDER — TIZANIDINE HCL 4 MG PO TABS: 4.0000 mg | ORAL_TABLET | Freq: Three times a day (TID) | ORAL | 5 refills | Status: DC | PRN

## 2023-05-26 MED ORDER — PREGABALIN 75 MG PO CAPS: 75.0000 mg | ORAL_CAPSULE | Freq: Two times a day (BID) | ORAL | 5 refills | Status: DC

## 2023-05-26 NOTE — Progress Notes (Signed)
VIRTUAL VISIT VIA VIDEO  I connected with Alison Vega on 05/26/23 at 11:40 AM EDT by a video enabled telemedicine application and verified that I am speaking with the correct person using two identifiers. Location patient: Home Location provider: [facility], Office Persons participating in the virtual visit: Patient, Dr. Claiborne Billings and Ivonne Andrew, CMA  I discussed the limitations of evaluation and management by telemedicine and the availability of in person appointments. The patient expressed understanding and agreed to proceed.     Alison Vega , 1981-05-23, 42 y.o., female MRN: [id number] Patient Care Team    Relationship Specialty Notifications Start End  Natalia Leatherwood, DO PCP - General Family Medicine  10/01/21   Ellamae Sia, DO Consulting Physician Allergy  11/01/21     Chief Complaint  Patient presents with   Neck Pain    States pain goes from neck and between shoulder blades     Subjective: Alison Vega is a 42 y.o. patient with complaints of ongoing pain between her shoulders that radiates up her neck and down her arms.  This has been a chronic pain for her in which she is established with [facility].  She reports she recently has received epidural injections a few months ago and this was not helpful for her.  Orthopedics provided her with naproxen and muscle relaxer prescriptions in March/April.  Patient reports the orthopedic team states that they do not feel surgery is appropriate at this time. She has been referred to pain management and she is still waiting on that referral process. No records available and ortho team not in EMR.  Prior note: Pt presents for an OV with complaints of right shoulder pain of 2 weeks duration.  Associated symptoms include pain that radiates down to her hand.  She has been seen at urgent care, then went to the emergency room which treated her with prednisone and naproxen.  She also had a shot of Toradol in the  emergency room.  She has since been seen by [facility] and is planning to have MRIs completed.  They had encouraged her to take naproxen for the pain.  Patient states the pain is not resolved by the naproxen use.       02/03/2023   11:36 AM 05/23/2022    1:17 PM 01/24/2022    8:31 AM 12/17/2021    8:24 AM 11/01/2021    9:30 AM  Depression screen PHQ 2/9  Decreased Interest 0 0 0 0 0  Down, Depressed, Hopeless 0 0 0 0 0  PHQ - 2 Score 0 0 0 0 0  Altered sleeping 0  0    Tired, decreased energy 0  0    Change in appetite 0  0    Feeling bad or failure about yourself  0  0    Trouble concentrating 0  0    Moving slowly or fidgety/restless 0  0    Suicidal thoughts 0  0    PHQ-9 Score 0  0      Allergies  Allergen Reactions   Penicillins Shortness Of Breath and Other (See Comments)    Shut patients throat.   Sulfa Antibiotics Shortness Of Breath and Other (See Comments)    Shuts patient's throat.   Avocado Hives   Social History   Social History Narrative   Marital status/children/pets: Single. G3P3   Education/employment: HS, employed at [employer].    Safety:      -  smoke alarm in the home:Yes     - wears seatbelt: Yes     - Feels safe in their relationships: Yes      Past Medical History:  Diagnosis Date   Asthma    Cellulitis    cellulitis/abcess x15, "every 2 months"   Urticaria    Past Surgical History:  Procedure Laterality Date   CESAREAN SECTION     EYE SURGERY     x3   OPEN REDUCTION INTERNAL FIXATION (ORIF) METACARPAL Right 06/13/2019   Procedure: OPEN TREATMENT OF RIGHT 5TH METACARPAL FRACTURE;  Surgeon: Mack Hook, MD;  Location: [facility];  Service: Orthopedics;  Laterality: Right;   TUBAL LIGATION     Family History  Problem Relation Age of Onset   Cancer Father        unknown type   Lung cancer Father    Diabetes Mother    Asthma Mother    Hepatitis C Mother    Asthma Son    Allergies as of  05/26/2023       Reactions   Penicillins Shortness Of Breath, Other (See Comments)   Shut patients throat.   Sulfa Antibiotics Shortness Of Breath, Other (See Comments)   Shuts patient's throat.   Avocado Hives        Medication List        Accurate as of May 26, 2023 12:27 PM. If you have any questions, ask your nurse or doctor.          STOP taking these medications    HYDROcodone-acetaminophen 10-325 MG tablet Commonly known as: NORCO Stopped by: Felix Pacini   naproxen sodium 550 MG tablet Commonly known as: Anaprox DS Stopped by: Felix Pacini       TAKE these medications    albuterol 108 (90 Base) MCG/ACT inhaler Commonly known as: Ventolin HFA Inhale 2 puffs into the lungs every 4 (four) hours as needed for wheezing or shortness of breath (coughing fits).   diclofenac 75 MG EC tablet Commonly known as: VOLTAREN Take 1 tablet (75 mg total) by mouth 2 (two) times daily with a meal. Started by: Felix Pacini   doxepin 50 MG capsule Commonly known as: SINEQUAN Take 1 capsule (50 mg total) by mouth at bedtime.   EPINEPHrine 0.3 mg/0.3 mL Soaj injection Commonly known as: EPI-PEN Inject 0.3 mg into the muscle as needed for anaphylaxis.   famotidine 20 MG tablet Commonly known as: PEPCID Take 1 tablet (20 mg total) by mouth 2 (two) times daily.   fluticasone 50 MCG/ACT nasal spray Commonly known as: FLONASE Place 1-2 sprays into both nostrils daily as needed for rhinitis.   hydrOXYzine 50 MG capsule Commonly known as: Vistaril Take 1-2 capsules (50-100 mg total) by mouth at bedtime as needed. OFFICE VISIT NEEDED FOR FURTHER REFILLS   levocetirizine 5 MG tablet Commonly known as: XYZAL Take 1 tablet (5 mg total) by mouth 2 (two) times daily as needed (hives).   lidocaine 5 % Commonly known as: Lidoderm Place 1 patch onto the skin daily. Remove & Discard patch within 12 hours   mometasone-formoterol 200-5 MCG/ACT Aero Commonly known as:  DULERA Inhale 2 puffs into the lungs in the morning and at bedtime. with spacer and rinse mouth afterwards.   pregabalin 75 MG capsule Commonly known as: LYRICA Take 1 capsule (75 mg total) by mouth every 12 (twelve) hours. Started by: Felix Pacini   Risperidone 0.25 MG Tbdp Take 1 tablet (0.25 mg total) by mouth  daily.   solifenacin 5 MG tablet Commonly known as: VESIcare Take 1 tablet (5 mg total) by mouth daily.   Spiriva Respimat 1.25 MCG/ACT Aers Generic drug: Tiotropium Bromide Monohydrate Inhale 2 puffs into the lungs daily.   tiZANidine 4 MG tablet Commonly known as: Zanaflex Take 1 tablet (4 mg total) by mouth every 8 (eight) hours as needed for muscle spasms. What changed:  when to take this Another medication with the same name was removed. Continue taking this medication, and follow the directions you see here. Changed by: Felix Pacini   Vitamin D (Ergocalciferol) 1.25 MG (50000 UNIT) Caps capsule Commonly known as: DRISDOL Take 1 capsule (50,000 Units total) by mouth every 7 (seven) days. OFFICE VISIT NEEDED FOR FURTHER REFILLS        All past medical history, surgical history, allergies, family history, immunizations andmedications were updated in the EMR today and reviewed under the history and medication portions of their EMR.     ROS Negative, with the exception of above mentioned in HPI   Objective:  Wt 168 lb 3.2 oz (76.3 kg)   BMI 30.76 kg/m  Body mass index is 30.76 kg/m. Physical Exam Vitals and nursing note reviewed.  Constitutional:      General: She is not in acute distress.    Appearance: Normal appearance. She is not toxic-appearing.  HENT:     Head: Normocephalic and atraumatic.  Eyes:     General: No scleral icterus.       Right eye: No discharge.        Left eye: No discharge.     Conjunctiva/sclera: Conjunctivae normal.  Pulmonary:     Effort: Pulmonary effort is normal.  Musculoskeletal:     Cervical back: Normal range of  motion.  Skin:    Findings: No rash.  Neurological:     Mental Status: She is alert and oriented to person, place, and time. Mental status is at baseline.  Psychiatric:        Mood and Affect: Mood normal.        Behavior: Behavior normal.        Thought Content: Thought content normal.        Judgment: Judgment normal.     No results found. No results found. No results found for this or any previous visit (from the past 24 hour(s)).  Assessment/Plan: Alison Vega is a 42 y.o. female present for OV for  Neck pain/thoracic pain/bilateral arm pain Patient's pain is now considered chronic pain and since it is from a degenerative process will require chronic management. She understands if she is in need of opioids to control her pain long-term, pain clinic establishment will be required.  We are waiting for the response from referral placed in June. Will attempt to get her pain more controlled while she awaits pain clinic response Discontinue naproxen, start diclofenac twice daily May continue Zanaflex 3 times daily as needed Start Lyrica 75 mg nightly for 1 week, then increase to 75 mg twice daily.  [place] controlled substance database reviewed and appropriate today. If needing refills on the above medicine will need to be seen before 6 months time.    Reviewed expectations re: course of current medical issues. Discussed self-management of symptoms. Outlined signs and symptoms indicating need for more acute intervention. Patient verbalized understanding and all questions were answered. Patient received an After-Visit Summary.    No orders of the defined types were placed in this encounter.  Meds ordered this  encounter  Medications   tiZANidine (ZANAFLEX) 4 MG tablet    Sig: Take 1 tablet (4 mg total) by mouth every 8 (eight) hours as needed for muscle spasms.    Dispense:  90 tablet    Refill:  5   pregabalin (LYRICA) 75 MG capsule    Sig: Take 1 capsule (75 mg  total) by mouth every 12 (twelve) hours.    Dispense:  60 capsule    Refill:  5   diclofenac (VOLTAREN) 75 MG EC tablet    Sig: Take 1 tablet (75 mg total) by mouth 2 (two) times daily with a meal.    Dispense:  60 tablet    Refill:  5   Referral Orders  No referral(s) requested today      Note is dictated utilizing voice recognition software. Although note has been proof read prior to signing, occasional typographical errors still can be missed. If any questions arise, please do not hesitate to call for verification.   electronically signed by:  Felix Pacini, DO  [facility] [place]

## 2023-05-26 NOTE — Patient Instructions (Signed)
No follow-ups on file.        Great to see you today.    

## 2023-05-27 ENCOUNTER — Telehealth: Payer: Self-pay

## 2023-05-27 ENCOUNTER — Other Ambulatory Visit (HOSPITAL_COMMUNITY): Payer: Self-pay

## 2023-05-27 NOTE — Telephone Encounter (Signed)
PA request has been Submitted. New Encounter created for follow up. For additional info see Pharmacy Prior Auth telephone encounter from 05/27/23.

## 2023-05-27 NOTE — Telephone Encounter (Signed)
Pharmacy Patient Advocate Encounter   Received notification from CoverMyMeds that prior authorization for Diclofenac Sodium 75MG  dr tablets is required/requested.   Insurance verification completed.   The patient is insured through [facility] .   Per test claim: PA submitted to [facility] via CoverMyMeds Key/confirmation #/[facility] Status is pending

## 2023-05-28 ENCOUNTER — Telehealth: Payer: Self-pay | Admitting: *Deleted

## 2023-05-28 ENCOUNTER — Other Ambulatory Visit (HOSPITAL_COMMUNITY): Payer: Self-pay

## 2023-05-28 ENCOUNTER — Other Ambulatory Visit: Payer: Self-pay

## 2023-05-28 MED ORDER — OMALIZUMAB 150 MG/ML ~~LOC~~ SOSY
300.0000 mg | PREFILLED_SYRINGE | SUBCUTANEOUS | 11 refills | Status: DC
  Filled 2023-05-28 (×5): qty 2, 28d supply, fill #0
  Filled 2023-06-24: qty 2, 28d supply, fill #1
  Filled 2023-07-20: qty 2, 28d supply, fill #2
  Filled 2023-08-19: qty 2, 28d supply, fill #3
  Filled 2023-09-25: qty 2, 28d supply, fill #4
  Filled 2023-10-15: qty 2, 28d supply, fill #5
  Filled 2023-11-19: qty 2, 28d supply, fill #6
  Filled 2023-12-21: qty 2, 28d supply, fill #7
  Filled 2024-01-12: qty 2, 28d supply, fill #8
  Filled 2024-02-25: qty 2, 28d supply, fill #9
  Filled 2024-03-18: qty 2, 28d supply, fill #10
  Filled 2024-05-10: qty 2, 28d supply, fill #11

## 2023-05-28 NOTE — Telephone Encounter (Signed)
Pharmacy Patient Advocate Encounter  Received notification from [facility] that Prior Authorization for Diclofenac Sodium 75MG  dr tablets  has been APPROVED from 05/27/23 to 05/26/24.   PA #/Case ID/Reference #: [id number]

## 2023-05-28 NOTE — Telephone Encounter (Signed)
-----   Message from Ellamae Sia sent at 05/19/2023  2:13 PM EDT ----- Please do PA for Xolair 300mg  every 4 weeks for hives. Patient has new insurance now. Thank you.

## 2023-05-28 NOTE — Telephone Encounter (Signed)
noted 

## 2023-05-28 NOTE — Telephone Encounter (Signed)
Called patient and advised approval and submit with new Ins to [facility]. Will reach out once delivery set to make appt to restart Xolair in clinic

## 2023-05-29 ENCOUNTER — Other Ambulatory Visit: Payer: Self-pay

## 2023-06-02 ENCOUNTER — Other Ambulatory Visit: Payer: Self-pay

## 2023-06-02 MED ORDER — RISPERIDONE 0.25 MG PO TBDP: 1.0000 | ORAL_TABLET | Freq: Every day | ORAL | 0 refills | Status: DC

## 2023-06-02 MED ORDER — DOXEPIN HCL 50 MG PO CAPS: 50.0000 mg | ORAL_CAPSULE | Freq: Every day | ORAL | 0 refills | Status: DC

## 2023-06-03 ENCOUNTER — Encounter (INDEPENDENT_AMBULATORY_CARE_PROVIDER_SITE_OTHER): Payer: Self-pay

## 2023-06-03 ENCOUNTER — Other Ambulatory Visit (HOSPITAL_COMMUNITY): Payer: Self-pay

## 2023-06-03 ENCOUNTER — Ambulatory Visit: Payer: Medicaid Other

## 2023-06-03 ENCOUNTER — Telehealth: Payer: Self-pay | Admitting: Internal Medicine

## 2023-06-03 NOTE — Telephone Encounter (Signed)
Patient would like a call back requesting medications for Xolair refill be sent to her office in [place] through her coworker. Please call patient at [phone].

## 2023-06-04 NOTE — Telephone Encounter (Signed)
Spoke to patient and she wants her boss who is trained to give her xolair injs since she has already been on therapy and has no transportation to office at this time. I advised she could have sent to her home and take to work for [employer] and advised [facility] of same

## 2023-06-05 ENCOUNTER — Ambulatory Visit (INDEPENDENT_AMBULATORY_CARE_PROVIDER_SITE_OTHER): Payer: Medicaid Other

## 2023-06-05 DIAGNOSIS — L501 Idiopathic urticaria: Secondary | ICD-10-CM | POA: Diagnosis not present

## 2023-06-05 NOTE — Progress Notes (Signed)
Immunotherapy   Patient Details  Name: Alison Vega MRN: [id number] Date of Birth: 1981-08-14  06/05/2023  Alison Vega started injections for Hives. Patient restart Xolair 300 mg. Patient waited 30 minutes with no problems.   Frequency:every 28 days Epi-Pen:Epi-Pen Available  Consent signed and patient instructions given.   Dub Mikes 06/05/2023, 9:34 AM

## 2023-06-07 ENCOUNTER — Other Ambulatory Visit: Payer: Self-pay | Admitting: Family Medicine

## 2023-06-17 DIAGNOSIS — Z112 Encounter for screening for other bacterial diseases: Secondary | ICD-10-CM | POA: Diagnosis not present

## 2023-06-24 ENCOUNTER — Other Ambulatory Visit (HOSPITAL_COMMUNITY): Payer: Self-pay

## 2023-06-29 ENCOUNTER — Other Ambulatory Visit (HOSPITAL_COMMUNITY): Payer: Self-pay

## 2023-07-02 ENCOUNTER — Encounter: Payer: Medicaid Other | Admitting: Family Medicine

## 2023-07-08 ENCOUNTER — Other Ambulatory Visit (HOSPITAL_COMMUNITY): Payer: Self-pay

## 2023-07-20 ENCOUNTER — Other Ambulatory Visit (HOSPITAL_COMMUNITY): Payer: Self-pay

## 2023-07-20 NOTE — Progress Notes (Unsigned)
Follow Up Note  RE: Alison Vega MRN: [id number] DOB: March 02, 1981 Date of Office Visit: 07/21/2023  Referring provider: Natalia Leatherwood, DO Primary care provider: Natalia Leatherwood, DO  Chief Complaint: No chief complaint on file.  History of Present Illness: I had the pleasure of seeing Alison Vega for a follow up visit at the [facility] on 07/20/2023. She is a 42 y.o. female, who is being followed for CIU on Xolair, asthma, chronic rhinitis. Her previous allergy office visit was on 05/19/2023 with Dr. Selena Batten. Today is a regular follow up visit.  Chronic idiopathic urticaria Past history - Lab evaluation unremarkable (FCeRI antibody, anti-thyroglobulin antibody, thyroid peroxidase antibody, tryptase, H. pylori serology, CBC, CMP, ESR, ANA, and alpha-gal panel). On Xolair in the past.  Interim history -  only had the sample injection in November due to change in her insurance. Still breaks out a few times per month.  Plan on restarting Xolair 300mg  every 4 weeks. Take the following medications:  Doxepin 50mg  once a day at night.  Xyzal (levocetirizine) 5mg  once a day in the morning.  Pepcid (famotidine) 20mg  twice a day. Avoid the following potential triggers: alcohol, tight clothing, NSAIDs, hot showers and getting overheated.   Moderate persistent asthma without complication Past history - normal CXR in 2021. Symbicort and Breo too expensive. Prednisone in December 2022.  Interim history - doing well with Trelegy but insurance no longer wants to cover it.  Today's spirometry was normal.  Daily controller medication(s): stop Trelegy. Start Dulera 2 puffs twice a day with spacer and rinse mouth afterwards. Start Spiriva 2 puffs once a day. Demonstrated proper use.  If too expensive let me know.  If not as effective - will need to do PA for Trelegy next.  May use albuterol rescue inhaler 2-4 puffs every 4 to 6 hours as needed for shortness of breath, chest  tightness, coughing, and wheezing. May use albuterol rescue inhaler 2 puffs 5 to 15 minutes prior to strenuous physical activities. Monitor frequency of use.  Get spirometry at next visit.   Nonallergic rhinitis Past history - 2020 skin testing negative to pediatric environmental panel.  Use Flonase (fluticasone) nasal spray 1 spray per nostril twice a day as needed for nasal congestion.  Nasal saline spray (i.e., Simply Saline) or nasal saline lavage (i.e., NeilMed) is recommended as needed and prior to medicated nasal sprays. May take Xyzal (levocetirizine) 5mg  1-2 times daily as needed.    Return in about 2 months (around 07/20/2023).    Assessment and Plan: Thera is a 42 y.o. female with: Chronic idiopathic urticaria Past history - Lab evaluation unremarkable (FCeRI antibody, anti-thyroglobulin antibody, thyroid peroxidase antibody, tryptase, H. pylori serology, CBC, CMP, ESR, ANA, and alpha-gal panel). On Xolair in the past.   Moderate persistent asthma without complication Past history - normal CXR in 2021. Symbicort and Breo too expensive. Prednisone in December 2022.  Interim history - doing well with Trelegy but insurance no longer wants to cover it.   Chronic rhinitis Past history - 2020 skin testing negative to pediatric environmental panel.    No follow-ups on file.  No orders of the defined types were placed in this encounter.  Lab Orders  No laboratory test(s) ordered today    Diagnostics: Spirometry:  Tracings reviewed. Her effort: {Blank single:19197::"Good reproducible efforts.","It was hard to get consistent efforts and there is a question as to whether this reflects a maximal maneuver.","Poor effort, data can not be  interpreted."} FVC: ***L FEV1: ***L, ***% predicted FEV1/FVC ratio: ***% Interpretation: {Blank single:19197::"Spirometry consistent with mild obstructive disease","Spirometry consistent with moderate obstructive disease","Spirometry consistent  with severe obstructive disease","Spirometry consistent with possible restrictive disease","Spirometry consistent with mixed obstructive and restrictive disease","Spirometry uninterpretable due to technique","Spirometry consistent with normal pattern","No overt abnormalities noted given today's efforts"}.  Please see scanned spirometry results for details.  Skin Testing: {Blank single:19197::"Select foods","Environmental allergy panel","Environmental allergy panel and select foods","Food allergy panel","None","Deferred due to recent antihistamines use"}. *** Results discussed with patient/family.   Medication List:  Current Outpatient Medications  Medication Sig Dispense Refill  . albuterol (VENTOLIN HFA) 108 (90 Base) MCG/ACT inhaler Inhale 2 puffs into the lungs every 4 (four) hours as needed for wheezing or shortness of breath (coughing fits). 18 g 1  . diclofenac (VOLTAREN) 75 MG EC tablet Take 1 tablet (75 mg total) by mouth 2 (two) times daily with a meal. 60 tablet 5  . doxepin (SINEQUAN) 50 MG capsule Take 1 capsule (50 mg total) by mouth at bedtime. 30 capsule 0  . EPINEPHrine 0.3 mg/0.3 mL IJ SOAJ injection Inject 0.3 mg into the muscle as needed for anaphylaxis. 2 each 1  . famotidine (PEPCID) 20 MG tablet Take 1 tablet (20 mg total) by mouth 2 (two) times daily. 60 tablet 3  . fluticasone (FLONASE) 50 MCG/ACT nasal spray Place 1-2 sprays into both nostrils daily as needed for rhinitis. 16 g 5  . hydrOXYzine (VISTARIL) 50 MG capsule Take 1-2 capsules (50-100 mg total) by mouth at bedtime as needed. OFFICE VISIT NEEDED FOR FURTHER REFILLS 60 capsule 0  . levocetirizine (XYZAL) 5 MG tablet Take 1 tablet (5 mg total) by mouth 2 (two) times daily as needed (hives). 60 tablet 3  . lidocaine (LIDODERM) 5 % Place 1 patch onto the skin daily. Remove & Discard patch within 12 hours 30 patch 0  . mometasone-formoterol (DULERA) 200-5 MCG/ACT AERO Inhale 2 puffs into the lungs in the morning and  at bedtime. with spacer and rinse mouth afterwards. 1 each 3  . omalizumab (XOLAIR) 150 MG/ML prefilled syringe Inject 300 mg into the skin every 28 (twenty-eight) days. 2 mL 11  . pregabalin (LYRICA) 75 MG capsule Take 1 capsule (75 mg total) by mouth every 12 (twelve) hours. 60 capsule 5  . Risperidone 0.25 MG TBDP Take 1 tablet (0.25 mg total) by mouth daily. 30 tablet 0  . solifenacin (VESICARE) 5 MG tablet Take 1 tablet (5 mg total) by mouth daily. 30 tablet 5  . Tiotropium Bromide Monohydrate (SPIRIVA RESPIMAT) 1.25 MCG/ACT AERS Inhale 2 puffs into the lungs daily. 4 g 3  . tiZANidine (ZANAFLEX) 4 MG tablet Take 1 tablet (4 mg total) by mouth every 8 (eight) hours as needed for muscle spasms. 90 tablet 5  . Vitamin D, Ergocalciferol, (DRISDOL) 1.25 MG (50000 UNIT) CAPS capsule Take 1 capsule (50,000 Units total) by mouth every 7 (seven) days. OFFICE VISIT NEEDED FOR FURTHER REFILLS 4 capsule 0   Current Facility-Administered Medications  Medication Dose Route Frequency Provider Last Rate Last Admin  . omalizumab [name]) injection 300 mg  300 mg Subcutaneous Q28 days Ellamae Sia, DO   300 mg at 06/05/23 6063   Allergies: Allergies  Allergen Reactions  . Penicillins Shortness Of Breath and Other (See Comments)    Shut patients throat.  . Sulfa Antibiotics Shortness Of Breath and Other (See Comments)    Shuts patient's throat.  Marland Kitchen Avocado Hives   I reviewed her past  medical history, social history, family history, and environmental history and no significant changes have been reported from her previous visit.  Review of Systems  Constitutional:  Negative for appetite change, chills, fever and unexpected weight change.  HENT:  Negative for congestion and rhinorrhea.   Eyes:  Negative for itching.  Respiratory:  Negative for cough, chest tightness, shortness of breath and wheezing.   Gastrointestinal:  Negative for abdominal pain.  Skin:  Positive for rash.       pruritus   Allergic/Immunologic: Negative for environmental allergies and food allergies.  Neurological:  Negative for headaches.   Objective: There were no vitals taken for this visit. There is no height or weight on file to calculate BMI. Physical Exam Vitals and nursing note reviewed.  Constitutional:      Appearance: Normal appearance. She is well-developed.  HENT:     Head: Normocephalic and atraumatic.     Right Ear: Tympanic membrane and external ear normal.     Left Ear: Tympanic membrane and external ear normal.     Nose: Nose normal.     Mouth/Throat:     Mouth: Mucous membranes are moist.     Pharynx: Oropharynx is clear.  Eyes:     Conjunctiva/sclera: Conjunctivae normal.  Cardiovascular:     Rate and Rhythm: Normal rate and regular rhythm.     Heart sounds: Normal heart sounds. No murmur heard.    No friction rub. No gallop.  Pulmonary:     Effort: Pulmonary effort is normal.     Breath sounds: Normal breath sounds. No wheezing or rales.  Musculoskeletal:     Cervical back: Neck supple.  Skin:    General: Skin is warm.     Findings: No rash.     Comments: No hives today.  Neurological:     Mental Status: She is alert and oriented to person, place, and time.  Psychiatric:        Behavior: Behavior normal.  Previous notes and tests were reviewed. The plan was reviewed with the patient/family, and all questions/concerned were addressed.  It was my pleasure to see [facility] today and participate in her care. Please feel free to contact me with any questions or concerns.  Sincerely,  Wyline Mood, DO Allergy & Immunology  [facility] office: [phone] [facility] office: [phone]

## 2023-07-21 ENCOUNTER — Ambulatory Visit (INDEPENDENT_AMBULATORY_CARE_PROVIDER_SITE_OTHER): Payer: Medicaid Other | Admitting: Allergy

## 2023-07-21 ENCOUNTER — Encounter: Payer: Self-pay | Admitting: Allergy

## 2023-07-21 VITALS — BP 130/88 | HR 87 | Temp 98.6°F | Resp 16

## 2023-07-21 DIAGNOSIS — J4540 Moderate persistent asthma, uncomplicated: Secondary | ICD-10-CM

## 2023-07-21 DIAGNOSIS — J310 Chronic rhinitis: Secondary | ICD-10-CM | POA: Diagnosis not present

## 2023-07-21 DIAGNOSIS — L501 Idiopathic urticaria: Secondary | ICD-10-CM

## 2023-07-21 MED ORDER — ALBUTEROL SULFATE HFA 108 (90 BASE) MCG/ACT IN AERS: 2.0000 | INHALATION_SPRAY | RESPIRATORY_TRACT | 1 refills | Status: AC | PRN

## 2023-07-21 MED ORDER — LEVOCETIRIZINE DIHYDROCHLORIDE 5 MG PO TABS: 5.0000 mg | ORAL_TABLET | Freq: Two times a day (BID) | ORAL | 5 refills | Status: AC | PRN

## 2023-07-21 MED ORDER — EPINEPHRINE 0.3 MG/0.3ML IJ SOAJ: 0.3000 mg | INTRAMUSCULAR | 1 refills | Status: AC | PRN

## 2023-07-21 MED ORDER — TRELEGY ELLIPTA 200-62.5-25 MCG/ACT IN AEPB: 1.0000 | INHALATION_SPRAY | Freq: Every day | RESPIRATORY_TRACT | 5 refills | Status: AC

## 2023-07-21 NOTE — Patient Instructions (Addendum)
Let me know if medications are too expensive.   Urticaria Continue Xolair 300mg  every 4 weeks. Take the following medications:  Doxepin 50mg  once a day at night.  Xyzal (levocetirizine) 5mg  once a day in the morning.  Pepcid (famotidine) 20mg  twice a day. Avoid the following potential triggers: alcohol, tight clothing, NSAIDs, hot showers and getting overheated.  Asthma  Daily controller medication(s): will send in Trelegy 1 puff once a day. Let me know if not covered. This will replace Dulera and Spiriva.  May use albuterol rescue inhaler 2 puffs every 4 to 6 hours as needed for shortness of breath, chest tightness, coughing, and wheezing. May use albuterol rescue inhaler 2 puffs 5 to 15 minutes prior to strenuous physical activities. Monitor frequency of use - if you need to use it more than twice per week on a consistent basis let us know.  Asthma control goals:  Full participation in all desired activities (may need albuterol before activity) Albuterol use two times or less a week on average (not counting use with activity) Cough interfering with sleep two times or less a month Oral steroids no more than once a year No hospitalizations  Chronic rhinitis Use Flonase (fluticasone) nasal spray 1 spray per nostril twice a day as needed for nasal congestion.  Nasal saline spray (i.e., Simply Saline) or nasal saline lavage (i.e., NeilMed) is recommended as needed and prior to medicated nasal sprays. May take Xyzal (levocetirizine) 5mg  1-2 times daily as needed.    Follow up in 4 months or sooner if needed.   If you run out of your medications before the next visit and you can't afford it then call our office so we can get you samples if we have them.

## 2023-07-27 ENCOUNTER — Other Ambulatory Visit (HOSPITAL_COMMUNITY): Payer: Self-pay

## 2023-07-27 ENCOUNTER — Other Ambulatory Visit: Payer: Self-pay

## 2023-07-28 ENCOUNTER — Telehealth: Payer: Self-pay

## 2023-07-28 NOTE — Telephone Encounter (Signed)
PA request has been Submitted. New Encounter created for follow up. For additional info see Pharmacy Prior Auth telephone encounter from 09/24.

## 2023-07-28 NOTE — Telephone Encounter (Signed)
Please look at 05/15/2023 telephone encounter.  She FAILED spiriva + Dulera combination. She tried for 2 months and asthma was doing better with Trelegy.  Please do PA.  Thank you.  From prior note: "Pt need try/fail or documentation why cannot use one of the formulary alternatives: Advair Diskus (brand), Advair HFA (brand), Dulera, Spiriva Respimat"

## 2023-07-28 NOTE — Telephone Encounter (Signed)
Pys insurance does not cover trelegy, advair diskus, dulera and symbicort are covered advise to change in therapy

## 2023-07-28 NOTE — Telephone Encounter (Signed)
*  Asthma/Allergy  Pharmacy Patient Advocate Encounter   Received notification from Pt Calls Messages that prior authorization for Trelegy Ellipta 200-62.5-25MCG/ACT aerosol powder  is required/requested.   Insurance verification completed.   The patient is insured through [facility] .   Per test claim: PA required; PA submitted to [facility] via CoverMyMeds Key/confirmation #/EOC [id number] Status is pending

## 2023-07-29 ENCOUNTER — Other Ambulatory Visit (HOSPITAL_COMMUNITY): Payer: Self-pay

## 2023-07-29 NOTE — Telephone Encounter (Signed)
Pharmacy Patient Advocate Encounter  Received notification from [facility] that Prior Authorization for Trelegy Ellipta 200-62.5-25MCG/ACT aerosol powder  has been APPROVED from 07/28/2023 to 07/27/2024. Ran test claim, Copay is $will need DUR override at dispensing pharmacy. This test claim was processed through [facility]- copay amounts may vary at other pharmacies due to pharmacy/plan contracts, or as the patient moves through the different stages of their insurance plan.

## 2023-08-07 ENCOUNTER — Ambulatory Visit (INDEPENDENT_AMBULATORY_CARE_PROVIDER_SITE_OTHER): Payer: Medicaid Other | Admitting: Family Medicine

## 2023-08-07 DIAGNOSIS — Z0000 Encounter for general adult medical examination without abnormal findings: Secondary | ICD-10-CM

## 2023-08-07 DIAGNOSIS — Z13220 Encounter for screening for lipoid disorders: Secondary | ICD-10-CM

## 2023-08-07 DIAGNOSIS — Z23 Encounter for immunization: Secondary | ICD-10-CM

## 2023-08-07 DIAGNOSIS — Z131 Encounter for screening for diabetes mellitus: Secondary | ICD-10-CM

## 2023-08-07 DIAGNOSIS — Z91199 Patient's noncompliance with other medical treatment and regimen due to unspecified reason: Secondary | ICD-10-CM

## 2023-08-07 DIAGNOSIS — N393 Stress incontinence (female) (male): Secondary | ICD-10-CM

## 2023-08-07 DIAGNOSIS — Z1231 Encounter for screening mammogram for malignant neoplasm of breast: Secondary | ICD-10-CM

## 2023-08-07 NOTE — Progress Notes (Signed)
   No-show/chronic condition appointment

## 2023-08-07 NOTE — Patient Instructions (Signed)

## 2023-08-17 ENCOUNTER — Encounter: Payer: Self-pay | Admitting: Family Medicine

## 2023-08-17 ENCOUNTER — Ambulatory Visit (INDEPENDENT_AMBULATORY_CARE_PROVIDER_SITE_OTHER): Payer: Medicaid Other | Admitting: Family Medicine

## 2023-08-17 VITALS — BP 138/78 | HR 86 | Temp 98.0°F | Ht 62.0 in | Wt 174.4 lb

## 2023-08-17 DIAGNOSIS — G8929 Other chronic pain: Secondary | ICD-10-CM

## 2023-08-17 DIAGNOSIS — M546 Pain in thoracic spine: Secondary | ICD-10-CM | POA: Diagnosis not present

## 2023-08-17 DIAGNOSIS — Z13220 Encounter for screening for lipoid disorders: Secondary | ICD-10-CM

## 2023-08-17 DIAGNOSIS — E559 Vitamin D deficiency, unspecified: Secondary | ICD-10-CM | POA: Diagnosis not present

## 2023-08-17 DIAGNOSIS — Z1231 Encounter for screening mammogram for malignant neoplasm of breast: Secondary | ICD-10-CM | POA: Diagnosis not present

## 2023-08-17 DIAGNOSIS — Z131 Encounter for screening for diabetes mellitus: Secondary | ICD-10-CM

## 2023-08-17 DIAGNOSIS — Z0000 Encounter for general adult medical examination without abnormal findings: Secondary | ICD-10-CM

## 2023-08-17 DIAGNOSIS — F5101 Primary insomnia: Secondary | ICD-10-CM | POA: Diagnosis not present

## 2023-08-17 DIAGNOSIS — M542 Cervicalgia: Secondary | ICD-10-CM | POA: Diagnosis not present

## 2023-08-17 DIAGNOSIS — F3340 Major depressive disorder, recurrent, in remission, unspecified: Secondary | ICD-10-CM

## 2023-08-17 DIAGNOSIS — Z23 Encounter for immunization: Secondary | ICD-10-CM

## 2023-08-17 DIAGNOSIS — L501 Idiopathic urticaria: Secondary | ICD-10-CM

## 2023-08-17 DIAGNOSIS — F3170 Bipolar disorder, currently in remission, most recent episode unspecified: Secondary | ICD-10-CM

## 2023-08-17 LAB — HEMOGLOBIN A1C: Hgb A1c MFr Bld: 5.6 % (ref 4.6–6.5)

## 2023-08-17 LAB — COMPREHENSIVE METABOLIC PANEL
ALT: 13 U/L (ref 0–35)
AST: 11 U/L (ref 0–37)
Albumin: 3.9 g/dL (ref 3.5–5.2)
Alkaline Phosphatase: 43 U/L (ref 39–117)
BUN: 12 mg/dL (ref 6–23)
CO2: 26 meq/L (ref 19–32)
Calcium: 8.9 mg/dL (ref 8.4–10.5)
Chloride: 109 meq/L (ref 96–112)
Creatinine, Ser: 0.81 mg/dL (ref 0.40–1.20)
GFR: 90.97 mL/min (ref >60.00)
Glucose, Bld: 83 mg/dL (ref 70–99)
Potassium: 3.5 meq/L (ref 3.5–5.1)
Sodium: 141 meq/L (ref 135–145)
Total Bilirubin: 0.3 mg/dL (ref 0.2–1.2)
Total Protein: 6.4 g/dL (ref 6.0–8.3)

## 2023-08-17 LAB — LIPID PANEL
Cholesterol: 138 mg/dL (ref 0–200)
HDL: 48 mg/dL (ref >39.00)
LDL Cholesterol: 36 mg/dL (ref 0–99)
NonHDL: 90.18
Total CHOL/HDL Ratio: 3
Triglycerides: 271 mg/dL — ABNORMAL HIGH (ref 0.0–149.0)
VLDL: 54.2 mg/dL — ABNORMAL HIGH (ref 0.0–40.0)

## 2023-08-17 LAB — CBC
HCT: 39 % (ref 36.0–46.0)
Hemoglobin: 12.2 g/dL (ref 12.0–15.0)
MCHC: 31.2 g/dL (ref 30.0–36.0)
MCV: 79 fL (ref 78.0–100.0)
Platelets: 354 10*3/uL (ref 150.0–400.0)
RBC: 4.94 Mil/uL (ref 3.87–5.11)
RDW: 17.4 % — ABNORMAL HIGH (ref 11.5–15.5)
WBC: 6.2 10*3/uL (ref 4.0–10.5)

## 2023-08-17 LAB — VITAMIN D 25 HYDROXY (VIT D DEFICIENCY, FRACTURES): VITD: 14.79 ng/mL — ABNORMAL LOW (ref 30.00–100.00)

## 2023-08-17 LAB — TSH: TSH: 2.37 u[IU]/mL (ref 0.35–5.50)

## 2023-08-17 MED ORDER — DICLOFENAC SODIUM 75 MG PO TBEC: 75.0000 mg | DELAYED_RELEASE_TABLET | Freq: Two times a day (BID) | ORAL | 1 refills | Status: DC

## 2023-08-17 MED ORDER — PREGABALIN 75 MG PO CAPS: 75.0000 mg | ORAL_CAPSULE | Freq: Two times a day (BID) | ORAL | 1 refills | Status: DC

## 2023-08-17 MED ORDER — RISPERIDONE 0.25 MG PO TBDP: 1.0000 | ORAL_TABLET | Freq: Two times a day (BID) | ORAL | 1 refills | Status: DC

## 2023-08-17 MED ORDER — TIZANIDINE HCL 4 MG PO TABS: 4.0000 mg | ORAL_TABLET | Freq: Every day | ORAL | 1 refills | Status: DC

## 2023-08-17 MED ORDER — DOXEPIN HCL 50 MG PO CAPS: 50.0000 mg | ORAL_CAPSULE | Freq: Every day | ORAL | 1 refills | Status: DC

## 2023-08-17 NOTE — Patient Instructions (Addendum)
[facility] Address: [address] [place] [id number], [place], [place] [address] Open ? Closes 4:30?PM Phone: [phone]   Return in about 24 weeks (around 02/01/2024) for Routine chronic condition follow-up.        Great to see you today.  I have refilled the medication(s) we provide.   If labs were collected or images ordered, we will inform you of  results once we have received them and reviewed. We will contact you either by echart message, or telephone call.  Please give ample time to the testing facility, and our office to run,  receive and review results. Please do not call inquiring of results, even if you can see them in your chart. We will contact you as soon as we are able. If it has been over 1 week since the test was completed, and you have not yet heard from [place], then please call us.    - echart message- for normal results that have been seen by the patient already.   - telephone call: abnormal results or if patient has not viewed results in their echart.  If a referral to a specialist was entered for you, please call us in 2 weeks if you have not heard from the specialist office to schedule.

## 2023-08-17 NOTE — Progress Notes (Unsigned)
Patient ID: Alison Vega, female  DOB: [birth year], 42 y.o.   MRN: [id number] Patient Care Team    Relationship Specialty Notifications Start End  Natalia Leatherwood, DO PCP - General Family Medicine  10/01/21   Ellamae Sia, DO Consulting Physician Allergy  11/01/21   Hermina Staggers, MD Consulting Physician Obstetrics and Gynecology  08/07/23     Chief Complaint  Patient presents with   Annual Exam    Subjective: Alison Vega is a 42 y.o.  Female  [name]. All past medical history, surgical history, allergies, family history, immunizations, medications and social history were updated in the electronic medical record today. All recent labs, ED visits and hospitalizations within the last year were reviewed.  Health maintenance:  Colonoscopy:no fhx. Routine screen at 47 Mammogram: no fhx. completed:***, birads ***.  Cervical cancer screening: last pap: 01/2022, results: WNL/Neg hpv, completed by: Dr. Alysia Penna Immunizations: tdap UTD 05/30/2021, Influenza declined (encouraged yearly),  Infectious disease screening: HIV and  Hep C completed DEXA: lroutine screen at 62 Assistive device: none Oxygen YQI:HKVQ Patient has a Dental home. Hospitalizations/ED visits: reviewed  ***     08/17/2023    9:10 AM 02/03/2023   11:36 AM 05/23/2022    1:17 PM 01/24/2022    8:31 AM 12/17/2021    8:24 AM  Depression screen PHQ 2/9  Decreased Interest 1 0 0 0 0  Down, Depressed, Hopeless 2 0 0 0 0  PHQ - 2 Score 3 0 0 0 0  Altered sleeping 0 0  0   Tired, decreased energy 0 0  0   Change in appetite 1 0  0   Feeling bad or failure about yourself  1 0  0   Trouble concentrating 3 0  0   Moving slowly or fidgety/restless 3 0  0   Suicidal thoughts 0 0  0   PHQ-9 Score 11 0  0   Difficult doing work/chores Very difficult          08/17/2023    9:11 AM 01/24/2022    8:32 AM  GAD 7 : Generalized Anxiety Score  Nervous, Anxious, on Edge 3 3   Control/stop worrying 3 3  Worry too much - different things 3 1  Trouble relaxing 3 1  Restless 3 3  Easily annoyed or irritable 3 3  Afraid - awful might happen 3 1  Total GAD 7 Score 21 15    Immunization History  Administered Date(s) Administered   Influenza-Unspecified 08/03/2021   Moderna Sars-Covid-2 Vaccination 02/15/2020, 03/09/2020, 09/26/2020   PNEUMOCOCCAL CONJUGATE-20 05/30/2021   Rabies, IM 11/19/2022   Tdap 05/30/2021     Past Medical History:  Diagnosis Date   Asthma    Cellulitis    cellulitis/abcess x15, "every 2 months"   Chronic rhinitis 09/13/2019   Urticaria    Allergies  Allergen Reactions   Penicillins Shortness Of Breath and Other (See Comments)    Shut patients throat.   Sulfa Antibiotics Shortness Of Breath and Other (See Comments)    Shuts patient's throat.   Avocado Hives   Past Surgical History:  Procedure Laterality Date   CESAREAN SECTION     EYE SURGERY     x3   OPEN REDUCTION INTERNAL FIXATION (ORIF) METACARPAL Right 06/13/2019   Procedure: OPEN TREATMENT OF RIGHT 5TH METACARPAL FRACTURE;  Surgeon: Mack Hook, MD;  Location: [facility];  Service: Orthopedics;  Laterality: Right;  TUBAL LIGATION     Family History  Problem Relation Age of Onset   Cancer Father        unknown type   Lung cancer Father    Diabetes Mother    Asthma Mother    Hepatitis C Mother    Asthma Son    Social History   Social History Narrative   Marital status/children/pets: Single. G3P3   Education/employment: HS, employed at [employer].    Safety:      -smoke alarm in the home:Yes     - wears seatbelt: Yes     - Feels safe in their relationships: Yes       Allergies as of 08/17/2023       Reactions   Penicillins Shortness Of Breath, Other (See Comments)   Shut patients throat.   Sulfa Antibiotics Shortness Of Breath, Other (See Comments)   Shuts patient's throat.   Avocado Hives        Medication  List        Accurate as of August 17, 2023  9:26 AM. If you have any questions, ask your nurse or doctor.          albuterol 108 (90 Base) MCG/ACT inhaler Commonly known as: Ventolin HFA Inhale 2 puffs into the lungs every 4 (four) hours as needed for wheezing or shortness of breath (coughing fits).   diclofenac 75 MG EC tablet Commonly known as: VOLTAREN Take 1 tablet (75 mg total) by mouth 2 (two) times daily with a meal.   doxepin 50 MG capsule Commonly known as: SINEQUAN Take 1 capsule (50 mg total) by mouth at bedtime.   EPINEPHrine 0.3 mg/0.3 mL Soaj injection Commonly known as: EPI-PEN Inject 0.3 mg into the muscle as needed for anaphylaxis.   famotidine 20 MG tablet Commonly known as: PEPCID Take 1 tablet (20 mg total) by mouth 2 (two) times daily.   fluticasone 50 MCG/ACT nasal spray Commonly known as: FLONASE Place 1-2 sprays into both nostrils daily as needed for rhinitis.   levocetirizine 5 MG tablet Commonly known as: XYZAL Take 1 tablet (5 mg total) by mouth 2 (two) times daily as needed (hives).   pregabalin 75 MG capsule Commonly known as: LYRICA Take 1 capsule (75 mg total) by mouth every 12 (twelve) hours.   Risperidone 0.25 MG Tbdp Take 1 tablet (0.25 mg total) by mouth daily.   solifenacin 5 MG tablet Commonly known as: VESIcare Take 1 tablet (5 mg total) by mouth daily.   Spiriva Respimat 1.25 MCG/ACT Aers Generic drug: Tiotropium Bromide Monohydrate SMARTSIG:2 Puff(s) Via Inhaler Daily   tiZANidine 4 MG tablet Commonly known as: Zanaflex Take 1 tablet (4 mg total) by mouth every 8 (eight) hours as needed for muscle spasms.   Trelegy Ellipta 200-62.5-25 MCG/ACT Aepb Generic drug: Fluticasone-Umeclidin-Vilant Inhale 1 puff into the lungs daily. Rinse mouth after each use.   Xolair 150 MG/ML prefilled syringe Generic drug: omalizumab Inject 300 mg into the skin every 28 (twenty-eight) days.        All past medical history,  surgical history, allergies, family history, immunizations andmedications were updated in the EMR today and reviewed under the history and medication portions of their EMR.     No results found for this or any previous visit (from the past 2160 hour(s)).  ROS 14 pt review of systems performed and negative (unless mentioned in an HPI)  Objective: BP 138/78   Pulse 86   Temp 98 F (36.7 C)  Ht 5\' 2"  (1.575 m)   Wt 174 lb 6.4 oz (79.1 kg)   SpO2 98%   BMI 31.90 kg/m  Physical Exam Vitals and nursing note reviewed.  Constitutional:      General: She is not in acute distress.    Appearance: Normal appearance. She is not ill-appearing or toxic-appearing.  HENT:     Head: Normocephalic and atraumatic.     Right Ear: Tympanic membrane, ear canal and external ear normal. There is no impacted cerumen.     Left Ear: Tympanic membrane, ear canal and external ear normal. There is no impacted cerumen.     Nose: No congestion or rhinorrhea.     Mouth/Throat:     Mouth: Mucous membranes are moist.     Pharynx: Oropharynx is clear. No oropharyngeal exudate or posterior oropharyngeal erythema.  Eyes:     General: No scleral icterus.       Right eye: No discharge.        Left eye: No discharge.     Extraocular Movements: Extraocular movements intact.     Conjunctiva/sclera: Conjunctivae normal.     Pupils: Pupils are equal, round, and reactive to light.  Cardiovascular:     Rate and Rhythm: Normal rate and regular rhythm.     Pulses: Normal pulses.     Heart sounds: Normal heart sounds. No murmur heard.    No friction rub. No gallop.  Pulmonary:     Effort: Pulmonary effort is normal. No respiratory distress.     Breath sounds: Normal breath sounds. No stridor. No wheezing, rhonchi or rales.  Chest:     Chest wall: No tenderness.  Abdominal:     General: Abdomen is flat. Bowel sounds are normal. There is no distension.     Palpations: Abdomen is soft. There is no mass.     Tenderness:  There is no abdominal tenderness. There is no right CVA tenderness, left CVA tenderness, guarding or rebound.     Hernia: No hernia is present.  Musculoskeletal:        General: No swelling, tenderness or deformity. Normal range of motion.     Cervical back: Normal range of motion and neck supple. No rigidity or tenderness.     Right lower leg: No edema.     Left lower leg: No edema.  Lymphadenopathy:     Cervical: No cervical adenopathy.  Skin:    General: Skin is warm and dry.     Coloration: Skin is not jaundiced or pale.     Findings: No bruising, erythema, lesion or rash.  Neurological:     General: No focal deficit present.     Mental Status: She is alert and oriented to person, place, and time. Mental status is at baseline.     Cranial Nerves: No cranial nerve deficit.     Sensory: No sensory deficit.     Motor: No weakness.     Coordination: Coordination normal.     Gait: Gait normal.     Deep Tendon Reflexes: Reflexes normal.  Psychiatric:        Mood and Affect: Mood normal.        Behavior: Behavior normal.        Thought Content: Thought content normal.        Judgment: Judgment normal.        No results found.  Assessment/plan: Larice Baillargeon is a 42 y.o. female present for CPE ***   Patient was encouraged to exercise greater  than 150 minutes a week. Patient was encouraged to choose a diet filled with fresh fruits and vegetables, and lean meats. AVS provided to patient today for education/recommendation on gender specific health and safety maintenance.  Return in about 24 weeks (around 02/01/2024) for Routine chronic condition follow-up.    Orders Placed This Encounter  Procedures   CBC   Comprehensive metabolic panel   Hemoglobin A1c   Lipid panel   TSH   VITAMIN D 25 Hydroxy (Vit-D Deficiency, Fractures)   No orders of the defined types were placed in this encounter.  Referral Orders  No referral(s) requested today     Electronically signed  by: Felix Pacini, DO [facility]

## 2023-08-18 ENCOUNTER — Other Ambulatory Visit: Payer: Self-pay | Admitting: Family Medicine

## 2023-08-18 DIAGNOSIS — E559 Vitamin D deficiency, unspecified: Secondary | ICD-10-CM

## 2023-08-18 MED ORDER — VITAMIN D (ERGOCALCIFEROL) 1.25 MG (50000 UNIT) PO CAPS
50000.0000 [IU] | ORAL_CAPSULE | ORAL | 3 refills | Status: DC
Start: 2023-08-18 — End: 2024-01-29

## 2023-08-19 ENCOUNTER — Telehealth: Payer: Self-pay

## 2023-08-19 ENCOUNTER — Other Ambulatory Visit (HOSPITAL_COMMUNITY): Payer: Self-pay

## 2023-08-19 ENCOUNTER — Other Ambulatory Visit (HOSPITAL_COMMUNITY): Payer: Self-pay | Admitting: Pharmacy Technician

## 2023-08-19 DIAGNOSIS — M25511 Pain in right shoulder: Secondary | ICD-10-CM

## 2023-08-19 DIAGNOSIS — M533 Sacrococcygeal disorders, not elsewhere classified: Secondary | ICD-10-CM

## 2023-08-19 NOTE — Telephone Encounter (Signed)
Patient referred to [facility]. They do not accept Medicaid.  The only place in [place] that patient could find that takes Medicaid is [facility] on [address].  Existing referral has been closed.  _________________________________________  Reason for Referral Request:  Pain Management  Has patient been seen PCP for this complaint? Y  No,  please schedule patient for appointment for complaint.  Patient scheduled on:   Yes, please find out following information.  Referral for which specialty:  Pain Management  Preferred office/provider: [facility] on [address], [place]

## 2023-08-19 NOTE — Telephone Encounter (Signed)
Yes. Please place referral and let pt know. thx

## 2023-08-19 NOTE — Addendum Note (Signed)
Addended by: Filomena Jungling on: 08/19/2023 03:33 PM   Modules accepted: Orders

## 2023-08-19 NOTE — Progress Notes (Signed)
Specialty Pharmacy Refill Coordination Note  Alison Vega is a 42 y.o. female contacted today regarding refills of specialty medication(s) Omalizumab   Patient requested Delivery   Delivery date: 08/27/23   Verified address: [address]  [place]   Medication will be filled on 08/26/23.

## 2023-08-20 ENCOUNTER — Encounter: Payer: Self-pay | Admitting: Family Medicine

## 2023-09-16 ENCOUNTER — Ambulatory Visit
Admission: RE | Admit: 2023-09-16 | Discharge: 2023-09-16 | Disposition: A | Discharge status: Home / Self Care | Payer: Medicaid Other | Source: Ambulatory Visit | Attending: Family Medicine | Admitting: Family Medicine

## 2023-09-16 DIAGNOSIS — Z1231 Encounter for screening mammogram for malignant neoplasm of breast: Secondary | ICD-10-CM | POA: Diagnosis not present

## 2023-09-22 DIAGNOSIS — G8929 Other chronic pain: Secondary | ICD-10-CM | POA: Diagnosis not present

## 2023-09-22 DIAGNOSIS — M62838 Other muscle spasm: Secondary | ICD-10-CM | POA: Diagnosis not present

## 2023-09-22 DIAGNOSIS — M545 Low back pain, unspecified: Secondary | ICD-10-CM | POA: Diagnosis not present

## 2023-09-22 DIAGNOSIS — M542 Cervicalgia: Secondary | ICD-10-CM | POA: Diagnosis not present

## 2023-09-23 ENCOUNTER — Other Ambulatory Visit: Payer: Self-pay

## 2023-09-25 ENCOUNTER — Other Ambulatory Visit: Payer: Self-pay

## 2023-09-25 NOTE — Progress Notes (Signed)
Specialty Pharmacy Refill Coordination Note  Alison Vega is a 42 y.o. female contacted today regarding refills of specialty medication(s) No data recorded  Patient requested Delivery   Delivery date: 09/29/23   Verified address: [address] [id number]   Medication will be filled on 09/28/23.

## 2023-09-28 ENCOUNTER — Other Ambulatory Visit: Payer: Self-pay

## 2023-10-05 DIAGNOSIS — R5383 Other fatigue: Secondary | ICD-10-CM | POA: Diagnosis not present

## 2023-10-05 DIAGNOSIS — Z1159 Encounter for screening for other viral diseases: Secondary | ICD-10-CM | POA: Diagnosis not present

## 2023-10-05 DIAGNOSIS — E7800 Pure hypercholesterolemia, unspecified: Secondary | ICD-10-CM | POA: Diagnosis not present

## 2023-10-05 DIAGNOSIS — A499 Bacterial infection, unspecified: Secondary | ICD-10-CM | POA: Diagnosis not present

## 2023-10-05 DIAGNOSIS — M549 Dorsalgia, unspecified: Secondary | ICD-10-CM | POA: Diagnosis not present

## 2023-10-05 DIAGNOSIS — E6609 Other obesity due to excess calories: Secondary | ICD-10-CM | POA: Diagnosis not present

## 2023-10-05 DIAGNOSIS — Z79899 Other long term (current) drug therapy: Secondary | ICD-10-CM | POA: Diagnosis not present

## 2023-10-05 DIAGNOSIS — R300 Dysuria: Secondary | ICD-10-CM | POA: Diagnosis not present

## 2023-10-05 DIAGNOSIS — R0602 Shortness of breath: Secondary | ICD-10-CM | POA: Diagnosis not present

## 2023-10-05 DIAGNOSIS — Z131 Encounter for screening for diabetes mellitus: Secondary | ICD-10-CM | POA: Diagnosis not present

## 2023-10-05 DIAGNOSIS — D539 Nutritional anemia, unspecified: Secondary | ICD-10-CM | POA: Diagnosis not present

## 2023-10-05 DIAGNOSIS — M129 Arthropathy, unspecified: Secondary | ICD-10-CM | POA: Diagnosis not present

## 2023-10-05 DIAGNOSIS — E559 Vitamin D deficiency, unspecified: Secondary | ICD-10-CM | POA: Diagnosis not present

## 2023-10-07 DIAGNOSIS — Z79899 Other long term (current) drug therapy: Secondary | ICD-10-CM | POA: Diagnosis not present

## 2023-10-12 ENCOUNTER — Other Ambulatory Visit: Payer: Self-pay | Admitting: Allergy

## 2023-10-12 DIAGNOSIS — R030 Elevated blood-pressure reading, without diagnosis of hypertension: Secondary | ICD-10-CM | POA: Diagnosis not present

## 2023-10-12 DIAGNOSIS — M47812 Spondylosis without myelopathy or radiculopathy, cervical region: Secondary | ICD-10-CM | POA: Diagnosis not present

## 2023-10-12 DIAGNOSIS — M4186 Other forms of scoliosis, lumbar region: Secondary | ICD-10-CM | POA: Diagnosis not present

## 2023-10-12 DIAGNOSIS — F319 Bipolar disorder, unspecified: Secondary | ICD-10-CM | POA: Diagnosis not present

## 2023-10-12 DIAGNOSIS — N390 Urinary tract infection, site not specified: Secondary | ICD-10-CM | POA: Diagnosis not present

## 2023-10-12 DIAGNOSIS — R300 Dysuria: Secondary | ICD-10-CM | POA: Diagnosis not present

## 2023-10-12 DIAGNOSIS — A499 Bacterial infection, unspecified: Secondary | ICD-10-CM | POA: Diagnosis not present

## 2023-10-12 DIAGNOSIS — E611 Iron deficiency: Secondary | ICD-10-CM | POA: Diagnosis not present

## 2023-10-12 DIAGNOSIS — Z79899 Other long term (current) drug therapy: Secondary | ICD-10-CM | POA: Diagnosis not present

## 2023-10-12 DIAGNOSIS — N890 Mild vaginal dysplasia: Secondary | ICD-10-CM | POA: Diagnosis not present

## 2023-10-12 DIAGNOSIS — E6609 Other obesity due to excess calories: Secondary | ICD-10-CM | POA: Diagnosis not present

## 2023-10-12 DIAGNOSIS — Z6832 Body mass index (BMI) 32.0-32.9, adult: Secondary | ICD-10-CM | POA: Diagnosis not present

## 2023-10-15 ENCOUNTER — Other Ambulatory Visit: Payer: Self-pay

## 2023-10-15 DIAGNOSIS — Z79899 Other long term (current) drug therapy: Secondary | ICD-10-CM | POA: Diagnosis not present

## 2023-10-15 NOTE — Progress Notes (Signed)
Specialty Pharmacy Refill Coordination Note  Alison Vega is a 42 y.o. female contacted today regarding refills of specialty medication(s) Omalizumab [name])   Patient requested Delivery   Delivery date: 10/27/23   Verified address: [address] [place] [address]   Medication will be filled on 10/26/23.

## 2023-10-19 ENCOUNTER — Other Ambulatory Visit: Payer: Self-pay

## 2023-10-19 DIAGNOSIS — Z79899 Other long term (current) drug therapy: Secondary | ICD-10-CM | POA: Diagnosis not present

## 2023-10-19 DIAGNOSIS — N390 Urinary tract infection, site not specified: Secondary | ICD-10-CM | POA: Diagnosis not present

## 2023-10-19 DIAGNOSIS — A499 Bacterial infection, unspecified: Secondary | ICD-10-CM | POA: Diagnosis not present

## 2023-10-19 NOTE — Progress Notes (Signed)
Clinical Intervention Note  Clinical Intervention Notes: Pt reported starting Tramadol. Per Up to date, no DDI with Tramadol. No further intervention needed at this time.   Clinical Intervention Outcomes: Prevention of an adverse drug event   Bobette Mo Specialty Pharmacist

## 2023-10-26 ENCOUNTER — Other Ambulatory Visit: Payer: Self-pay

## 2023-11-18 NOTE — Progress Notes (Deleted)
 Follow Up Note  RE: Alison Vega MRN: [id number] DOB: 03-23-1981 Date of Office Visit: 11/19/2023  Referring provider: Natalia Leatherwood, DO Primary care provider: Natalia Leatherwood, DO  Chief Complaint: No chief complaint on file.  History of Present Illness: I had the pleasure of seeing Alison Vega for a follow up visit at [facility] on 11/18/2023. She is a 43 y.o. female, who is being followed for CIU on Xolair, asthma, chronic rhinitis. Her previous allergy office visit was on 07/21/2023 with Dr. Selena Batten. Today is a regular follow up visit.  Discussed the use of AI scribe software for clinical note transcription with the patient, who gave verbal consent to proceed.  History of Present Illness            ***  Assessment and Plan: Alison Vega is a 43 y.o. female with: Chronic idiopathic urticaria Past history - Lab evaluation unremarkable (FCeRI antibody, anti-thyroglobulin antibody, thyroid peroxidase antibody, tryptase, H. pylori serology, CBC, CMP, ESR, ANA, and alpha-gal panel). On Xolair in the past.  Interim history - restarted Xolair and no outbreaks. Getting injections at home.  Continue Xolair 300mg  every 4 weeks at home/work. Take the following medications:  Doxepin 50mg  once a day at night.  Xyzal (levocetirizine) 5mg  once a day in the morning.  Pepcid (famotidine) 20mg  twice a day. Avoid the following potential triggers: alcohol, tight clothing, NSAIDs, hot showers and getting overheated.   Moderate persistent asthma without complication Past history - normal CXR in 2021. Symbicort and Breo too expensive. Prednisone in December 2022.  Interim history - needs to use albuterol twice per day with the Spiriva and Dulera combination. With Trelegy she rarely had to use albuterol. Today's spirometry was normal. Daily controller medication(s): will send in Trelegy 1 puff once a day. Let me know if not covered. This will replace Dulera and Spiriva.   May use albuterol rescue inhaler 2 puffs every 4 to 6 hours as needed for shortness of breath, chest tightness, coughing, and wheezing. May use albuterol rescue inhaler 2 puffs 5 to 15 minutes prior to strenuous physical activities. Monitor frequency of use - if you need to use it more than twice per week on a consistent basis let us know   Chronic rhinitis Past history - 2020 skin testing negative to pediatric environmental panel.  Use Flonase (fluticasone) nasal spray 1 spray per nostril twice a day as needed for nasal congestion.  Nasal saline spray (i.e., Simply Saline) or nasal saline lavage (i.e., NeilMed) is recommended as needed and prior to medicated nasal sprays. May take Xyzal (levocetirizine) 5mg  1-2 times daily as needed.  Assessment and Plan              No follow-ups on file.  No orders of the defined types were placed in this encounter.  Lab Orders  No laboratory test(s) ordered today    Diagnostics: Spirometry:  Tracings reviewed. Her effort: {Blank single:19197::"Good reproducible efforts.","It was hard to get consistent efforts and there is a question as to whether this reflects a maximal maneuver.","Poor effort, data can not be interpreted."} FVC: ***L FEV1: ***L, ***% predicted FEV1/FVC ratio: ***% Interpretation: {Blank single:19197::"Spirometry consistent with mild obstructive disease","Spirometry consistent with moderate obstructive disease","Spirometry consistent with severe obstructive disease","Spirometry consistent with possible restrictive disease","Spirometry consistent with mixed obstructive and restrictive disease","Spirometry uninterpretable due to technique","Spirometry consistent with normal pattern","No overt abnormalities noted given today's efforts"}.  Please see scanned spirometry results for details.  Skin Testing: {  Blank single:19197::"Select foods","Environmental allergy panel","Environmental allergy panel and select foods","Food allergy  panel","None","Deferred due to recent antihistamines use"}. *** Results discussed with patient/family.   Medication List:  Current Outpatient Medications  Medication Sig Dispense Refill  . albuterol (VENTOLIN HFA) 108 (90 Base) MCG/ACT inhaler Inhale 2 puffs into the lungs every 4 (four) hours as needed for wheezing or shortness of breath (coughing fits). 18 g 1  . diclofenac (VOLTAREN) 75 MG EC tablet Take 1 tablet (75 mg total) by mouth 2 (two) times daily with a meal. 180 tablet 1  . doxepin (SINEQUAN) 50 MG capsule Take 1 capsule (50 mg total) by mouth at bedtime. 90 capsule 1  . EPINEPHrine 0.3 mg/0.3 mL IJ SOAJ injection Inject 0.3 mg into the muscle as needed for anaphylaxis. 2 each 1  . famotidine (PEPCID) 20 MG tablet Take 1 tablet (20 mg total) by mouth 2 (two) times daily. 60 tablet 3  . fluticasone (FLONASE) 50 MCG/ACT nasal spray Place 1-2 sprays into both nostrils daily as needed for rhinitis. 16 g 5  . Fluticasone-Umeclidin-Vilant (TRELEGY ELLIPTA) 200-62.5-25 MCG/ACT AEPB Inhale 1 puff into the lungs daily. Rinse mouth after each use. 60 each 5  . levocetirizine (XYZAL) 5 MG tablet Take 1 tablet (5 mg total) by mouth 2 (two) times daily as needed (hives). 60 tablet 5  . omalizumab (XOLAIR) 150 MG/ML prefilled syringe Inject 300 mg into the skin every 28 (twenty-eight) days. 2 mL 11  . pregabalin (LYRICA) 75 MG capsule Take 1 capsule (75 mg total) by mouth every 12 (twelve) hours. 180 capsule 1  . Risperidone 0.25 MG TBDP Take 1 tablet (0.25 mg total) by mouth in the morning and at bedtime. 180 tablet 1  . solifenacin (VESICARE) 5 MG tablet Take 1 tablet (5 mg total) by mouth daily. 30 tablet 5  . SPIRIVA RESPIMAT 1.25 MCG/ACT AERS SMARTSIG:2 Puff(s) Via Inhaler Daily    . tiZANidine (ZANAFLEX) 4 MG tablet Take 1 tablet (4 mg total) by mouth at bedtime. 90 tablet 1  . Vitamin D, Ergocalciferol, (DRISDOL) 1.25 MG (50000 UNIT) CAPS capsule Take 1 capsule (50,000 Units total) by  mouth every 7 (seven) days. 12 capsule 3   Current Facility-Administered Medications  Medication Dose Route Frequency Provider Last Rate Last Admin  . omalizumab [name]) injection 300 mg  300 mg Subcutaneous Q28 days Ellamae Sia, DO   300 mg at 06/05/23 0981   Allergies: Allergies  Allergen Reactions  . Penicillins Shortness Of Breath and Other (See Comments)    Shut patients throat.  . Sulfa Antibiotics Shortness Of Breath and Other (See Comments)    Shuts patient's throat.  Marland Kitchen Avocado Hives   I reviewed her past medical history, social history, family history, and environmental history and no significant changes have been reported from her previous visit.  Review of Systems  Constitutional:  Negative for appetite change, chills, fever and unexpected weight change.  HENT:  Negative for congestion and rhinorrhea.   Eyes:  Negative for itching.  Respiratory:  Positive for chest tightness. Negative for cough, shortness of breath and wheezing.   Gastrointestinal:  Negative for abdominal pain.  Skin:  Negative for rash.  Allergic/Immunologic: Negative for environmental allergies and food allergies.  Neurological:  Negative for headaches.   Objective: There were no vitals taken for this visit. There is no height or weight on file to calculate BMI. Physical Exam Vitals and nursing note reviewed.  Constitutional:      Appearance: Normal appearance.  She is well-developed.  HENT:     Head: Normocephalic and atraumatic.     Right Ear: Tympanic membrane and external ear normal.     Left Ear: Tympanic membrane and external ear normal.     Nose: Nose normal.     Mouth/Throat:     Mouth: Mucous membranes are moist.     Pharynx: Oropharynx is clear.  Eyes:     Conjunctiva/sclera: Conjunctivae normal.  Cardiovascular:     Rate and Rhythm: Normal rate and regular rhythm.     Heart sounds: Normal heart sounds. No murmur heard.    No friction rub. No gallop.  Pulmonary:     Effort:  Pulmonary effort is normal.     Breath sounds: Normal breath sounds. No wheezing or rales.  Musculoskeletal:     Cervical back: Neck supple.  Skin:    General: Skin is warm.     Findings: No rash.     Comments: No hives today.  Neurological:     Mental Status: She is alert and oriented to person, place, and time.  Psychiatric:        Behavior: Behavior normal.  Previous notes and tests were reviewed. The plan was reviewed with the patient/family, and all questions/concerned were addressed.  It was my pleasure to see Alison Vega today and participate in her care. Please feel free to contact me with any questions or concerns.  Sincerely,  Wyline Mood, DO Allergy & Immunology  [facility] office: [phone] [facility] office: [phone]

## 2023-11-19 ENCOUNTER — Other Ambulatory Visit: Payer: Self-pay

## 2023-11-19 ENCOUNTER — Ambulatory Visit: Payer: Medicaid Other | Admitting: Allergy

## 2023-11-19 DIAGNOSIS — J4540 Moderate persistent asthma, uncomplicated: Secondary | ICD-10-CM

## 2023-11-19 DIAGNOSIS — L501 Idiopathic urticaria: Secondary | ICD-10-CM

## 2023-11-19 DIAGNOSIS — J310 Chronic rhinitis: Secondary | ICD-10-CM

## 2023-11-19 NOTE — Progress Notes (Signed)
Specialty Pharmacy Refill Coordination Note  Alison Vega is a 43 y.o. female contacted today regarding refills of specialty medication(s) Omalizumab [name])   Patient requested Delivery   Delivery date: 11/27/23   Verified address: [address]   [place] [address]   Medication will be filled on 11/26/23.

## 2023-11-23 ENCOUNTER — Other Ambulatory Visit (HOSPITAL_COMMUNITY): Payer: Self-pay

## 2023-11-23 NOTE — Progress Notes (Signed)
Specialty Pharmacy Ongoing Clinical Assessment Note  Alison Vega is a 43 y.o. female who is being followed by the specialty pharmacy service for RxSp Allergy   Patient's specialty medication(s) reviewed today: Omalizumab [name])   Missed doses in the last 4 weeks: 0   Patient/Caregiver did not have any additional questions or concerns.   Therapeutic benefit summary: Patient is achieving benefit   Adverse events/side effects summary: No adverse events/side effects   Patient's therapy is appropriate to: Continue    Goals Addressed             This Visit's Progress    Minimize recurrence of flares       Patient is on track. Patient will maintain adherence. Patient reports that she is currently well-controlled on therapy.          Follow up:  6 months  Servando Snare Specialty Pharmacist

## 2023-11-24 ENCOUNTER — Ambulatory Visit (INDEPENDENT_AMBULATORY_CARE_PROVIDER_SITE_OTHER): Payer: Medicaid Other | Admitting: Allergy

## 2023-11-24 ENCOUNTER — Encounter: Payer: Self-pay | Admitting: Allergy

## 2023-11-24 VITALS — BP 110/70 | HR 105 | Temp 98.3°F | Resp 16 | Ht 61.0 in | Wt 176.5 lb

## 2023-11-24 DIAGNOSIS — J310 Chronic rhinitis: Secondary | ICD-10-CM | POA: Diagnosis not present

## 2023-11-24 DIAGNOSIS — Z79899 Other long term (current) drug therapy: Secondary | ICD-10-CM | POA: Diagnosis not present

## 2023-11-24 DIAGNOSIS — L501 Idiopathic urticaria: Secondary | ICD-10-CM

## 2023-11-24 DIAGNOSIS — J4540 Moderate persistent asthma, uncomplicated: Secondary | ICD-10-CM

## 2023-11-24 NOTE — Progress Notes (Signed)
Follow Up Note  RE: Alison Vega MRN: [id number] DOB: February 21, 1981 Date of Office Visit: 11/24/2023  Referring provider: Natalia Leatherwood, DO Primary care provider: Natalia Leatherwood, DO  Chief Complaint: Urticaria  History of Present Illness: I had the pleasure of seeing Alison Vega for a follow up visit at the [facility] on 11/24/2023. She is a 43 y.o. female, who is being followed for CIU on Xolair, asthma, chronic rhinitis. Her previous allergy office visit was on 07/21/2023 with Alison Vega. Today is a regular follow up visit.  Discussed the use of AI scribe software for clinical note transcription with the patient, who gave verbal consent to proceed.  The patient, who works in a group home, presents with a recent episode of facial rash today. She reports a significant outbreak of facial rash this morning, characterized by purple splotches, which she attributes to stress and cold weather exposure. She has not experienced such an outbreak before. The patient has been receiving Xolair injections every four weeks without any reported issues. She also takes famotidine once daily in the morning, levocetirizine in the morning, and doxepin at night.  The patient also reports a recent prescription for Tramadol 100mg  for back and neck pain. She has not reported any adverse effects from this medication.  The patient has been using Trelegy inhaler once daily and reports no need for a rescue inhaler. She has not had any recent Vega or ER visits. She also uses a nasal spray for allergies, as she has been sneezing more frequently due to clients smoking in the house.  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 Despite these challenges, the patient denies any significant issues with her current health status.      Assessment and Plan: Alison Vega is a 43 y.o. female with: Chronic idiopathic urticaria Past  history - Lab evaluation unremarkable (FCeRI antibody, anti-thyroglobulin antibody, thyroid peroxidase antibody, tryptase, H. pylori serology, CBC, CMP, ESR, ANA, and alpha-gal panel).  Interim history - No significant outbreaks recently. Noted a new rash on face today, possibly related to cold exposure. Xolair injections every 4 weeks, administered at home.  Keep an eye on your facial rash - let me know if it gets worse.  Take pictures.  Continue Xolair 300mg  every 4 weeks. Let me know if I need to refill the epinephrine prescription.  Take the following medications:  Doxepin 50mg  once a day at night.  Xyzal (levocetirizine) 5mg  once a day in the morning.  Pepcid (famotidine) 20mg  1-2 times per day.  Avoid the following potential triggers: alcohol, tight clothing, NSAIDs, hot showers and getting overheated. If doing well at next visit, will plan on weaning off some of the medications.   Moderate persistent asthma without complication Past history - normal CXR in 2021. Symbicort and Breo too expensive. Prednisone in December 2022.  Interim history - No recent exacerbations or need for rescue inhaler. Today's spirometry was normal. Daily controller medication(s): continue Trelegy 1 puff once a day and rinse mouth after each use.  May use albuterol rescue inhaler 2 puffs every 4 to 6 hours as needed for shortness of breath, chest tightness, coughing, and wheezing. May use albuterol rescue inhaler 2 puffs 5 to 15 minutes prior to strenuous physical activities. Monitor frequency of use - if you need to use it more than twice per week on a consistent basis let us know.  Chronic rhinitis Past history - 2020 skin testing negative to pediatric environmental panel.  Interim history - some nasal congestion recently. Use Flonase (fluticasone) nasal spray 1-2 sprays per nostril once a day as needed for nasal congestion.  Nasal saline spray (i.e., Simply Saline) or nasal saline lavage (i.e.,  NeilMed) is recommended as needed and prior to medicated nasal sprays. May take Xyzal (levocetirizine) 5mg  1-2 times daily as needed.    Return in about 6 months (around 05/23/2024).  No orders of the defined types were placed in this encounter.  Lab Orders  No laboratory test(s) ordered today   Diagnostics: Spirometry:  Tracings reviewed. Her effort: Good reproducible efforts. FVC: 3.04L FEV1: 2.76L, 101% predicted FEV1/FVC ratio: 91% Interpretation: Spirometry consistent with normal pattern.  Please see scanned spirometry results for details.  Results discussed with patient/family.  Medication List:  Current Outpatient Medications  Medication Sig Dispense Refill   albuterol (VENTOLIN HFA) 108 (90 Base) MCG/ACT inhaler Inhale 2 puffs into the lungs every 4 (four) hours as needed for wheezing or shortness of breath (coughing fits). 18 g 1   doxepin (SINEQUAN) 50 MG capsule Take 1 capsule (50 mg total) by mouth at bedtime. 90 capsule 1   EPINEPHrine 0.3 mg/0.3 mL IJ SOAJ injection Inject 0.3 mg into the muscle as needed for anaphylaxis. 2 each 1   famotidine (PEPCID) 20 MG tablet Take 1 tablet (20 mg total) by mouth 2 (two) times daily. 60 tablet 3   fluticasone (FLONASE) 50 MCG/ACT nasal spray Place 1-2 sprays into both nostrils daily as needed for rhinitis. 16 g 5   levocetirizine (XYZAL) 5 MG tablet Take 1 tablet (5 mg total) by mouth 2 (two) times daily as needed (hives). 60 tablet 5   naloxone (NARCAN) nasal spray 4 mg/0.1 mL SMARTSIG:Both Nares     omalizumab (XOLAIR) 150 MG/ML prefilled syringe Inject 300 mg into the skin every 28 (twenty-eight) days. 2 mL 11   pregabalin (LYRICA) 75 MG capsule Take 1 capsule (75 mg total) by mouth every 12 (twelve) hours. 180 capsule 1   Risperidone 0.25 MG TBDP Take 1 tablet (0.25 mg total) by mouth in the morning and at bedtime. 180 tablet 1   solifenacin (VESICARE) 5 MG tablet Take 1 tablet (5 mg total) by mouth daily. 30 tablet 5    SPIRIVA RESPIMAT 1.25 MCG/ACT AERS SMARTSIG:2 Puff(s) Via Inhaler Daily     tiZANidine (ZANAFLEX) 4 MG tablet Take 1 tablet (4 mg total) by mouth at bedtime. 90 tablet 1   traMADol (ULTRAM) 50 MG tablet Take 50 mg by mouth every 6 (six) hours as needed.     Vitamin D, Ergocalciferol, (DRISDOL) 1.25 MG (50000 UNIT) CAPS capsule Take 1 capsule (50,000 Units total) by mouth every 7 (seven) days. 12 capsule 3   Fluticasone-Umeclidin-Vilant (TRELEGY ELLIPTA) 200-62.5-25 MCG/ACT AEPB Inhale 1 puff into the lungs daily. Rinse mouth after each use. 60 each 5   Current Facility-Administered Medications  Medication Dose Route Frequency Provider Last Rate Last Admin   omalizumab [name]) injection 300 mg  300 mg Subcutaneous Q28 days Ellamae Sia, DO   300 mg at 06/05/23 0981   Allergies: Allergies  Allergen Reactions   Penicillins Shortness Of Breath and Other (See Comments)    Shut patients throat.   Sulfa Antibiotics Shortness Of Breath and Other (See Comments)    Shuts patient's throat.   Avocado Hives   I reviewed her past medical history, social history, family history, and environmental history  and no significant changes have been reported from her previous visit.  Review of Systems  Constitutional:  Negative for appetite change, chills, fever and unexpected weight change.  HENT:  Positive for congestion. Negative for rhinorrhea.   Eyes:  Negative for itching.  Respiratory:  Negative for cough, chest tightness, shortness of breath and wheezing.   Gastrointestinal:  Negative for abdominal pain.  Musculoskeletal:  Positive for back pain.  Skin:  Negative for rash.  Allergic/Immunologic: Negative for environmental allergies and food allergies.  Neurological:  Negative for headaches.    Objective: BP 110/70   Pulse (!) 105   Temp 98.3 F (36.8 C) (Temporal)   Resp 16   Ht 5\' 1"  (1.549 m)   Wt 176 lb 8 oz (80.1 kg)   SpO2 97%   BMI 33.35 kg/m  Body mass index is 33.35  kg/m. Physical Exam Vitals and nursing note reviewed.  Constitutional:      Appearance: Normal appearance. She is well-developed.  HENT:     Head: Normocephalic and atraumatic.     Right Ear: Tympanic membrane and external ear normal.     Left Ear: Tympanic membrane and external ear normal.     Nose: Nose normal.     Mouth/Throat:     Mouth: Mucous membranes are moist.     Pharynx: Oropharynx is clear.  Eyes:     Conjunctiva/sclera: Conjunctivae normal.  Cardiovascular:     Rate and Rhythm: Normal rate and regular rhythm.     Heart sounds: Normal heart sounds. No murmur heard.    No friction rub. No gallop.  Pulmonary:     Effort: Pulmonary effort is normal.     Breath sounds: Normal breath sounds. No wheezing or rales.  Musculoskeletal:     Cervical back: Neck supple.  Skin:    General: Skin is warm.     Findings: No rash.     Comments: Few circular erythematous patches on face b/l.  Neurological:     Mental Status: She is alert and oriented to person, place, and time.  Psychiatric:        Behavior: Behavior normal.   Previous notes and tests were reviewed. The plan was reviewed with the patient/family, and all questions/concerned were addressed.  It was my pleasure to see [facility] today and participate in her care. Please feel free to contact me with any questions or concerns.  Sincerely,  Wyline Mood, DO Allergy & Immunology  [facility] office: [phone] [facility] office: [phone]

## 2023-11-24 NOTE — Patient Instructions (Addendum)
Urticaria Keep an eye on your facial rash - let me know if it gets worse.  Take pictures.  Continue Xolair 300mg  every 4 weeks. Let me know if I need to refill the epinephrine prescription.  Take the following medications:  Doxepin 50mg  once a day at night.  Xyzal (levocetirizine) 5mg  once a day in the morning.  Pepcid (famotidine) 20mg  1-2 times per day.  Avoid the following potential triggers: alcohol, tight clothing, NSAIDs, hot showers and getting overheated.  Asthma  Daily controller medication(s): continue Trelegy 1 puff once a day and rinse mouth after each use.  May use albuterol rescue inhaler 2 puffs every 4 to 6 hours as needed for shortness of breath, chest tightness, coughing, and wheezing. May use albuterol rescue inhaler 2 puffs 5 to 15 minutes prior to strenuous physical activities. Monitor frequency of use - if you need to use it more than twice per week on a consistent basis let us know.  Asthma control goals:  Full participation in all desired activities (may need albuterol before activity) Albuterol use two times or less a week on average (not counting use with activity) Cough interfering with sleep two times or less a month Oral steroids no more than once a year No hospitalizations  Chronic rhinitis Use Flonase (fluticasone) nasal spray 1-2 sprays per nostril once a day as needed for nasal congestion.  Nasal saline spray (i.e., Simply Saline) or nasal saline lavage (i.e., NeilMed) is recommended as needed and prior to medicated nasal sprays. May take Xyzal (levocetirizine) 5mg  1-2 times daily as needed.    Follow up in 6 months or sooner if needed.   If you run out of your medications before the next visit and you can't afford it then call our office so we can get you samples if we have them.

## 2023-11-26 ENCOUNTER — Other Ambulatory Visit: Payer: Self-pay

## 2023-11-27 DIAGNOSIS — Z79899 Other long term (current) drug therapy: Secondary | ICD-10-CM | POA: Diagnosis not present

## 2023-12-14 ENCOUNTER — Other Ambulatory Visit: Payer: Self-pay | Admitting: Allergy

## 2023-12-16 ENCOUNTER — Other Ambulatory Visit: Payer: Self-pay

## 2023-12-21 ENCOUNTER — Other Ambulatory Visit: Payer: Self-pay

## 2023-12-21 ENCOUNTER — Other Ambulatory Visit: Payer: Self-pay | Admitting: Pharmacy Technician

## 2023-12-21 NOTE — Progress Notes (Signed)
 Specialty Pharmacy Refill Coordination Note  Alison Vega is a 43 y.o. female contacted today regarding refills of specialty medication(s) Omalizumab [name])   Patient requested Delivery   Delivery date: 12/25/23   Verified address: [address] [place]   Medication will be filled on 12/24/23.

## 2023-12-22 ENCOUNTER — Other Ambulatory Visit: Payer: Self-pay

## 2023-12-22 NOTE — Progress Notes (Signed)
 Patient was contacted via mychart that due to possible impending winter storm, medication will arrive on Wednesday 12/23/23

## 2023-12-23 DIAGNOSIS — R030 Elevated blood-pressure reading, without diagnosis of hypertension: Secondary | ICD-10-CM | POA: Diagnosis not present

## 2023-12-23 DIAGNOSIS — M4186 Other forms of scoliosis, lumbar region: Secondary | ICD-10-CM | POA: Diagnosis not present

## 2023-12-23 DIAGNOSIS — F319 Bipolar disorder, unspecified: Secondary | ICD-10-CM | POA: Diagnosis not present

## 2023-12-23 DIAGNOSIS — E611 Iron deficiency: Secondary | ICD-10-CM | POA: Diagnosis not present

## 2023-12-23 DIAGNOSIS — Z6832 Body mass index (BMI) 32.0-32.9, adult: Secondary | ICD-10-CM | POA: Diagnosis not present

## 2023-12-23 DIAGNOSIS — Z79899 Other long term (current) drug therapy: Secondary | ICD-10-CM | POA: Diagnosis not present

## 2023-12-23 DIAGNOSIS — M47812 Spondylosis without myelopathy or radiculopathy, cervical region: Secondary | ICD-10-CM | POA: Diagnosis not present

## 2023-12-23 DIAGNOSIS — E6609 Other obesity due to excess calories: Secondary | ICD-10-CM | POA: Diagnosis not present

## 2023-12-23 DIAGNOSIS — R3989 Other symptoms and signs involving the genitourinary system: Secondary | ICD-10-CM | POA: Diagnosis not present

## 2023-12-26 DIAGNOSIS — Z79899 Other long term (current) drug therapy: Secondary | ICD-10-CM | POA: Diagnosis not present

## 2024-01-12 ENCOUNTER — Other Ambulatory Visit: Payer: Self-pay

## 2024-01-12 ENCOUNTER — Other Ambulatory Visit (HOSPITAL_COMMUNITY): Payer: Self-pay

## 2024-01-12 NOTE — Progress Notes (Signed)
 Specialty Pharmacy Refill Coordination Note  Sylver Vantassell is a 43 y.o. female contacted today regarding refills of specialty medication(s) Omalizumab [name])   Patient requested (Patient-Rptd) Delivery   Delivery date: (Patient-Rptd) 01/25/24   Verified address: (Patient-Rptd) [address] [place] [address]   Medication will be filled on 03.24.25.

## 2024-01-18 ENCOUNTER — Telehealth: Payer: Self-pay | Admitting: Family Medicine

## 2024-01-18 NOTE — Telephone Encounter (Signed)
 Copied from CRM [id number]. Topic: Clinical - Prescription Issue >> Jan 18, 2024  9:41 AM Denese Killings wrote: Reason for CRM: Pharmacy [facility]) states that Risperidone 0.25 MG TBDP is on back order and they don't have it at the warehouse. Patient wants prescription sent to [facility] ([address]

## 2024-01-19 ENCOUNTER — Other Ambulatory Visit: Payer: Self-pay

## 2024-01-19 MED ORDER — RISPERIDONE 0.25 MG PO TBDP: 1.0000 | ORAL_TABLET | Freq: Two times a day (BID) | ORAL | 0 refills | Status: DC

## 2024-01-19 NOTE — Telephone Encounter (Signed)
 Spoke with pt and confirmed pharmacy, new rx sent for 30 day supply.

## 2024-01-20 DIAGNOSIS — E6609 Other obesity due to excess calories: Secondary | ICD-10-CM | POA: Diagnosis not present

## 2024-01-20 DIAGNOSIS — F319 Bipolar disorder, unspecified: Secondary | ICD-10-CM | POA: Diagnosis not present

## 2024-01-20 DIAGNOSIS — Z6832 Body mass index (BMI) 32.0-32.9, adult: Secondary | ICD-10-CM | POA: Diagnosis not present

## 2024-01-20 DIAGNOSIS — M4186 Other forms of scoliosis, lumbar region: Secondary | ICD-10-CM | POA: Diagnosis not present

## 2024-01-20 DIAGNOSIS — E611 Iron deficiency: Secondary | ICD-10-CM | POA: Diagnosis not present

## 2024-01-20 DIAGNOSIS — Z79899 Other long term (current) drug therapy: Secondary | ICD-10-CM | POA: Diagnosis not present

## 2024-01-20 DIAGNOSIS — N390 Urinary tract infection, site not specified: Secondary | ICD-10-CM | POA: Diagnosis not present

## 2024-01-20 DIAGNOSIS — M47812 Spondylosis without myelopathy or radiculopathy, cervical region: Secondary | ICD-10-CM | POA: Diagnosis not present

## 2024-01-25 ENCOUNTER — Other Ambulatory Visit: Payer: Self-pay

## 2024-01-25 DIAGNOSIS — Z79899 Other long term (current) drug therapy: Secondary | ICD-10-CM | POA: Diagnosis not present

## 2024-02-01 ENCOUNTER — Ambulatory Visit (INDEPENDENT_AMBULATORY_CARE_PROVIDER_SITE_OTHER): Payer: Medicaid Other | Admitting: Family Medicine

## 2024-02-01 ENCOUNTER — Encounter: Payer: Self-pay | Admitting: Family Medicine

## 2024-02-01 ENCOUNTER — Other Ambulatory Visit (HOSPITAL_BASED_OUTPATIENT_CLINIC_OR_DEPARTMENT_OTHER): Payer: Self-pay

## 2024-02-01 ENCOUNTER — Other Ambulatory Visit: Payer: Self-pay

## 2024-02-01 ENCOUNTER — Other Ambulatory Visit (HOSPITAL_COMMUNITY): Payer: Self-pay

## 2024-02-01 VITALS — BP 130/76 | HR 74 | Temp 98.1°F | Wt 165.4 lb

## 2024-02-01 DIAGNOSIS — M546 Pain in thoracic spine: Secondary | ICD-10-CM | POA: Diagnosis not present

## 2024-02-01 DIAGNOSIS — E559 Vitamin D deficiency, unspecified: Secondary | ICD-10-CM | POA: Diagnosis not present

## 2024-02-01 DIAGNOSIS — M542 Cervicalgia: Secondary | ICD-10-CM

## 2024-02-01 DIAGNOSIS — L501 Idiopathic urticaria: Secondary | ICD-10-CM | POA: Diagnosis not present

## 2024-02-01 DIAGNOSIS — G8929 Other chronic pain: Secondary | ICD-10-CM

## 2024-02-01 DIAGNOSIS — F3340 Major depressive disorder, recurrent, in remission, unspecified: Secondary | ICD-10-CM

## 2024-02-01 DIAGNOSIS — F3170 Bipolar disorder, currently in remission, most recent episode unspecified: Secondary | ICD-10-CM

## 2024-02-01 MED ORDER — DOXEPIN HCL 50 MG PO CAPS: 50.0000 mg | ORAL_CAPSULE | Freq: Every day | ORAL | 1 refills | Status: DC

## 2024-02-01 MED ORDER — TIZANIDINE HCL 4 MG PO TABS: 4.0000 mg | ORAL_TABLET | Freq: Every day | ORAL | 1 refills | Status: DC

## 2024-02-01 MED ORDER — PREGABALIN 75 MG PO CAPS: 75.0000 mg | ORAL_CAPSULE | Freq: Two times a day (BID) | ORAL | 1 refills | Status: DC

## 2024-02-01 MED ORDER — VITAMIN D (ERGOCALCIFEROL) 1.25 MG (50000 UNIT) PO CAPS: 50000.0000 [IU] | ORAL_CAPSULE | ORAL | 3 refills | Status: DC

## 2024-02-01 MED ORDER — RISPERIDONE 0.25 MG PO TBDP
1.0000 | ORAL_TABLET | Freq: Two times a day (BID) | ORAL | 0 refills | Status: DC
  Filled 2024-02-01: qty 60, 30d supply, fill #0

## 2024-02-01 NOTE — Progress Notes (Signed)
 Patient ID: Alison Vega, female  DOB: [birth year], 43 y.o.   MRN: [id number] Patient Care Team    Relationship Specialty Notifications Start End  Natalia Leatherwood, DO PCP - General Family Medicine  10/01/21   Ellamae Sia, DO Consulting Physician Allergy  11/01/21   Hermina Staggers, MD (Inactive) Consulting Physician Obstetrics and Gynecology  08/07/23     Chief Complaint  Patient presents with   Neck Pain    Chronic Conditions/illness Management     Subjective: Alison Vega is a 43 y.o.  Female  present for Chronic Conditions/illness Management. All past medical history, surgical history, allergies, family history, immunizations, medications and social history were updated in the electronic medical record today. All recent labs, ED visits and hospitalizations within the last year were reviewed.   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  She has been referred to pain clinic for further evaluation and treatment. She reports compliance with diclofenac twice daily, Zanaflex 2-4 mg twice daily as needed, Lyrica 75 mg twice daily   Chronic idiopathic urticaria/bipolar disorder: pt reports she is feeling well on risperidone 0.25 Bid, but the pharmacy has been out of this medication. Pt reports compliance with doxepin  and lyrica.      02/01/2024    8:38 AM 08/17/2023    9:10 AM 02/03/2023   11:36 AM 05/23/2022    1:17 PM 01/24/2022    8:31 AM  Depression screen PHQ 2/9  Decreased Interest 0 1 0 0 0  Down, Depressed, Hopeless 0 2 0 0 0  PHQ - 2 Score 0 3 0 0 0  Altered sleeping 1 0 0  0  Tired, decreased energy 3 0 0  0  Change in appetite 3 1 0  0  Feeling bad or failure about yourself  0 1 0  0  Trouble concentrating 0 3 0  0  Moving slowly or fidgety/restless 0 3 0  0  Suicidal thoughts 0 0 0  0  PHQ-9 Score 7 11 0  0  Difficult doing work/chores Not difficult at all Very difficult         02/01/2024    8:39 AM  08/17/2023    9:11 AM 01/24/2022    8:32 AM  GAD 7 : Generalized Anxiety Score  Nervous, Anxious, on Edge 1 3 3   Control/stop worrying 0 3 3  Worry too much - different things 1 3 1   Trouble relaxing 1 3 1   Restless 1 3 3   Easily annoyed or irritable 1 3 3   Afraid - awful might happen 0 3 1  Total GAD 7 Score 5 21 15   Anxiety Difficulty Not difficult at all      Immunization History  Administered Date(s) Administered   Influenza-Unspecified 08/03/2021   Moderna Sars-Covid-2 Vaccination 02/15/2020, 03/09/2020, 09/26/2020   PNEUMOCOCCAL CONJUGATE-20 05/30/2021   Rabies, IM 11/19/2022   Tdap 05/30/2021     Past Medical History:  Diagnosis Date   Asthma    Cellulitis    cellulitis/abcess x15, "every 2 months"   Chronic rhinitis 09/13/2019   Urticaria    Allergies  Allergen Reactions   Penicillins Shortness Of Breath and Other (See Comments)    Shut patients throat.   Sulfa Antibiotics Shortness Of Breath and Other (See Comments)    Shuts patient's throat.   Avocado Hives   Past Surgical History:  Procedure Laterality Date   CESAREAN SECTION     EYE  SURGERY     x3   OPEN REDUCTION INTERNAL FIXATION (ORIF) METACARPAL Right 06/13/2019   Procedure: OPEN TREATMENT OF RIGHT 5TH METACARPAL FRACTURE;  Surgeon: Mack Hook, MD;  Location: [facility];  Service: Orthopedics;  Laterality: Right;   TUBAL LIGATION     Family History  Problem Relation Age of Onset   Diabetes Mother    Asthma Mother    Hepatitis C Mother    Cancer Father        unknown type   Lung cancer Father    Breast cancer Maternal Grandmother    Breast cancer Paternal Grandmother    Asthma Son    Social History   Social History Narrative   Marital status/children/pets: Single. G3P3   Education/employment: HS, employed at [employer].    Safety:      -smoke alarm in the home:Yes     - wears seatbelt: Yes     - Feels safe in their relationships: Yes       Allergies  as of 02/01/2024       Reactions   Penicillins Shortness Of Breath, Other (See Comments)   Shut patients throat.   Sulfa Antibiotics Shortness Of Breath, Other (See Comments)   Shuts patient's throat.   Avocado Hives        Medication List        Accurate as of February 01, 2024 10:00 AM. If you have any questions, ask your nurse or doctor.          albuterol 108 (90 Base) MCG/ACT inhaler Commonly known as: Ventolin HFA Inhale 2 puffs into the lungs every 4 (four) hours as needed for wheezing or shortness of breath (coughing fits).   doxepin 50 MG capsule Commonly known as: SINEQUAN Take 1 capsule (50 mg total) by mouth at bedtime.   EPINEPHrine 0.3 mg/0.3 mL Soaj injection Commonly known as: EPI-PEN Inject 0.3 mg into the muscle as needed for anaphylaxis.   famotidine 20 MG tablet Commonly known as: PEPCID Take 1 tablet (20 mg total) by mouth 2 (two) times daily.   fluticasone 50 MCG/ACT nasal spray Commonly known as: FLONASE SHAKE LIQUID AND USE 1 TO 2 SPRAYS IN EACH NOSTRIL DAILY AS NEEDED FOR RHINITIS   levocetirizine 5 MG tablet Commonly known as: XYZAL Take 1 tablet (5 mg total) by mouth 2 (two) times daily as needed (hives).   naloxone 4 MG/0.1ML Liqd nasal spray kit Commonly known as: NARCAN SMARTSIG:Both Nares   pregabalin 75 MG capsule Commonly known as: LYRICA Take 1 capsule (75 mg total) by mouth every 12 (twelve) hours.   Risperidone 0.25 MG Tbdp Dissolve 1 tablet (0.25 mg total) by mouth in the morning and at bedtime. What changed: additional instructions Changed by: Felix Pacini   solifenacin 5 MG tablet Commonly known as: VESIcare Take 1 tablet (5 mg total) by mouth daily.   Spiriva Respimat 1.25 MCG/ACT Aers Generic drug: Tiotropium Bromide Monohydrate SMARTSIG:2 Puff(s) Via Inhaler Daily   tiZANidine 4 MG tablet Commonly known as: Zanaflex Take 1 tablet (4 mg total) by mouth at bedtime.   traMADol 50 MG tablet Commonly known as:  ULTRAM Take 50 mg by mouth every 6 (six) hours as needed.   Trelegy Ellipta 200-62.5-25 MCG/ACT Aepb Generic drug: Fluticasone-Umeclidin-Vilant Inhale 1 puff into the lungs daily. Rinse mouth after each use.   Vitamin D (Ergocalciferol) 1.25 MG (50000 UNIT) Caps capsule Commonly known as: DRISDOL Take 1 capsule (50,000 Units total) by mouth every  7 (seven) days.   Xolair 150 MG/ML prefilled syringe Generic drug: omalizumab Inject 300 mg into the skin every 28 (twenty-eight) days.        All past medical history, surgical history, allergies, family history, immunizations andmedications were updated in the EMR today and reviewed under the history and medication portions of their EMR.     No results found for this or any previous visit (from the past 2160 hours).   ROS 14 pt review of systems performed and negative (unless mentioned in an HPI)  Objective: BP 130/76   Pulse 74   Temp 98.1 F (36.7 C)   Wt 165 lb 6.4 oz (75 kg)   SpO2 96%   BMI 31.25 kg/m  Physical Exam Vitals and nursing note reviewed.  Constitutional:      General: She is not in acute distress.    Appearance: Normal appearance. She is not ill-appearing, toxic-appearing or diaphoretic.  HENT:     Head: Normocephalic and atraumatic.  Eyes:     General: No scleral icterus.       Right eye: No discharge.        Left eye: No discharge.     Extraocular Movements: Extraocular movements intact.     Conjunctiva/sclera: Conjunctivae normal.     Pupils: Pupils are equal, round, and reactive to light.  Cardiovascular:     Rate and Rhythm: Normal rate and regular rhythm.  Pulmonary:     Effort: Pulmonary effort is normal. No respiratory distress.     Breath sounds: Normal breath sounds. No wheezing, rhonchi or rales.  Musculoskeletal:     Right lower leg: No edema.     Left lower leg: No edema.  Skin:    General: Skin is warm.     Findings: No rash.  Neurological:     Mental Status: She is alert and  oriented to person, place, and time. Mental status is at baseline.     Motor: No weakness.     Gait: Gait normal.  Psychiatric:        Mood and Affect: Mood normal.        Behavior: Behavior normal.        Thought Content: Thought content normal.        Judgment: Judgment normal.        No results found.  Assessment/plan: Alison Vega is a 43 y.o. 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   Continue diclofenac twice daily with food Continue Zanaflex 2-4 mg 3 times daily as needed Continue Lyrica 75 mg twice daily-radiculopathy and anxiety  Chronic idiopathic urticaria/bipolar disorder-depression Stable Continue  doxepin 50 mg nightly-urticaria Continue  risperidone 0.25 mg twice daily-bipolar (sent to [facility])  Continue Zyrtec-urticaria  Return in about 7 months (around 08/17/2024) for cpe (20 min), Routine chronic condition follow-up.    No orders of the defined types were placed in this encounter.  Meds ordered this encounter  Medications   doxepin (SINEQUAN) 50 MG capsule    Sig: Take 1 capsule (50 mg total) by mouth at bedtime.    Dispense:  90 capsule    Refill:  1   pregabalin (LYRICA) 75 MG capsule    Sig: Take 1 capsule (75 mg total) by mouth every 12 (twelve) hours.    Dispense:  180 capsule    Refill:  1   Risperidone 0.25 MG TBDP    Sig: Dissolve 1 tablet (0.25 mg total) by mouth in the morning and at bedtime.    Dispense:  180 tablet    Refill:  0   tiZANidine (ZANAFLEX) 4 MG tablet    Sig: Take 1 tablet (4 mg total) by mouth at bedtime.    Dispense:  90 tablet    Refill:  1   Vitamin D, Ergocalciferol, (DRISDOL) 1.25 MG (50000 UNIT) CAPS capsule    Sig: Take 1 capsule (50,000 Units total) by mouth every 7 (seven)  days.    Dispense:  12 capsule    Refill:  3   Referral Orders  No referral(s) requested today     Electronically signed by: Felix Pacini, DO [facility]

## 2024-02-01 NOTE — Patient Instructions (Addendum)
 Return in about 7 months (around 08/17/2024) for cpe (20 min), Routine chronic condition follow-up.        Great to see you today.  I have refilled the medication(s) we provide.   If labs were collected or images ordered, we will inform you of  results once we have received them and reviewed. We will contact you either by echart message, or telephone call.  Please give ample time to the testing facility, and our office to run,  receive and review results. Please do not call inquiring of results, even if you can see them in your chart. We will contact you as soon as we are able. If it has been over 1 week since the test was completed, and you have not yet heard from [place], then please call us.    - echart message- for normal results that have been seen by the patient already.   - telephone call: abnormal results or if patient has not viewed results in their echart.  If a referral to a specialist was entered for you, please call us in 2 weeks if you have not heard from the specialist office to schedule.

## 2024-02-02 ENCOUNTER — Other Ambulatory Visit: Payer: Self-pay

## 2024-02-03 ENCOUNTER — Other Ambulatory Visit (HOSPITAL_COMMUNITY): Payer: Self-pay

## 2024-02-04 ENCOUNTER — Other Ambulatory Visit: Payer: Self-pay

## 2024-02-05 ENCOUNTER — Other Ambulatory Visit: Payer: Self-pay

## 2024-02-05 ENCOUNTER — Other Ambulatory Visit (HOSPITAL_COMMUNITY): Payer: Self-pay

## 2024-02-08 ENCOUNTER — Other Ambulatory Visit (HOSPITAL_COMMUNITY): Payer: Self-pay

## 2024-02-11 ENCOUNTER — Other Ambulatory Visit (HOSPITAL_COMMUNITY): Payer: Self-pay

## 2024-02-12 ENCOUNTER — Other Ambulatory Visit (HOSPITAL_COMMUNITY): Payer: Self-pay

## 2024-02-15 ENCOUNTER — Other Ambulatory Visit: Payer: Self-pay

## 2024-02-15 ENCOUNTER — Other Ambulatory Visit (HOSPITAL_COMMUNITY): Payer: Self-pay

## 2024-02-16 ENCOUNTER — Other Ambulatory Visit (HOSPITAL_COMMUNITY): Payer: Self-pay

## 2024-02-16 ENCOUNTER — Other Ambulatory Visit: Payer: Self-pay | Admitting: Family Medicine

## 2024-02-16 MED ORDER — RISPERIDONE 0.25 MG PO TBDP
1.0000 | ORAL_TABLET | Freq: Two times a day (BID) | ORAL | 0 refills | Status: DC
  Filled 2024-02-16 – 2024-02-25 (×2): qty 60, 30d supply, fill #0

## 2024-02-17 ENCOUNTER — Other Ambulatory Visit (HOSPITAL_COMMUNITY): Payer: Self-pay

## 2024-02-18 ENCOUNTER — Other Ambulatory Visit (HOSPITAL_COMMUNITY): Payer: Self-pay

## 2024-02-19 ENCOUNTER — Other Ambulatory Visit (HOSPITAL_COMMUNITY): Payer: Self-pay

## 2024-02-22 ENCOUNTER — Ambulatory Visit: Payer: Self-pay

## 2024-02-22 ENCOUNTER — Other Ambulatory Visit (HOSPITAL_COMMUNITY): Payer: Self-pay

## 2024-02-22 NOTE — Telephone Encounter (Signed)
  Chief Complaint: Hives Symptoms: Hives Frequency: Constant Pertinent Negatives: Patient denies difficulty breathing, difficulty swallowing Disposition: [] ED /[] Urgent Care (no appt availability in office) / [] Appointment(In office/virtual)/ []  [facility]/ [] Home Care/ [] Refused Recommended Disposition /[] [facility]/ []  Follow-up with PCP Additional Notes:  Developed hives today. Allergy  doctor has her on Xolair  shot. Hives start on knuckle and then spread if not taking medication. She took her allergy  medication and not helping, hives are spreading. Denies difficulty breathing or swallowing. On daily allergy  medication BID. She has not called allergist at this time. Acute follow up advised, no appointments available. Plan to call allergist after disconnecting with this Clinical research associate. Aware message will be sent to office that no appointments are available and to await follow up. If she is able to secure appointment with allergist she will return call to PCP.   Message from Atkins N sent at 02/22/2024  2:38 PM EDT  Summary: HIVES   Copied From CRM [id number]. Reason for Triage: pt has broke out into hives and stated she has taken everything and she is wanting to know what to do.      Reason for Disposition  [1] MODERATE-SEVERE hives persist (i.e., hives interfere with normal activities or work) AND [2] taking antihistamine (e.g., Benadryl , Claritin) > 24 hours  Protocols used: Hives-A-AH

## 2024-02-22 NOTE — Telephone Encounter (Signed)
 LVM to discuss

## 2024-02-23 ENCOUNTER — Encounter: Payer: Self-pay | Admitting: Family Medicine

## 2024-02-23 ENCOUNTER — Ambulatory Visit (INDEPENDENT_AMBULATORY_CARE_PROVIDER_SITE_OTHER): Admitting: Family Medicine

## 2024-02-23 VITALS — BP 138/82 | HR 67 | Temp 98.3°F | Resp 18 | Wt 163.0 lb

## 2024-02-23 DIAGNOSIS — J310 Chronic rhinitis: Secondary | ICD-10-CM | POA: Diagnosis not present

## 2024-02-23 DIAGNOSIS — J4540 Moderate persistent asthma, uncomplicated: Secondary | ICD-10-CM | POA: Diagnosis not present

## 2024-02-23 DIAGNOSIS — L501 Idiopathic urticaria: Secondary | ICD-10-CM

## 2024-02-23 MED ORDER — PREDNISONE 10 MG PO TABS: ORAL_TABLET | ORAL | 0 refills | Status: DC

## 2024-02-23 NOTE — Patient Instructions (Signed)
 Asthma Continue Trelegy 200-1 puff once a day to prevent cough or wheeze Continue albuterol  2 puffs once every 4 hours if needed for cough or wheeze You may use albuterol  2 puffs 5 to 15 minutes before activity to decrease cough or wheeze  Chronic rhinitis Continue Xyzal  5 mg once a day if needed for runny nose. You may take an additional dose of Xyzal  5 mg once a day if needed for breakthrough symptoms Continue Flonase  2 sprays in each nostril once a day if needed for stuffy nose Consider saline nasal rinses as needed for nasal symptoms. Use this before any medicated nasal sprays for best result  Chronic urticaria Begin prednisone  10 mg tablets. Take 2 tablets twice a day for 3 days, then take 2 tablets once a day for 1 day, then take 1 tablet on the 5th day, then stop  Take the least amount of medications while remaining hive free Levocetirizine (Xyzal ) 5 mg twice a day and famotidine  (Pepcid ) 20 mg twice a day. If no symptoms for 7-14 days then decrease to. Levocetirizine (Xyzal ) 5 mg twice a day and famotidine  (Pepcid ) 20 mg once a day.  If no symptoms for 7-14 days then decrease to. Levocetirizine (Xyzal ) 5 mg twice a day.  If no symptoms for 7-14 days then decrease to. Levocetirizine (Xyzal ) 5 mg once a day.  Continue doxepin  50 mg once at nighttime Continue Xolair  once every 4 weeks and have access to an epinephrine  autoinjector set per protocol  Call the clinic if this treatment plan is not working well for you.  Follow up in 3 months or sooner if needed.

## 2024-02-23 NOTE — Progress Notes (Signed)
 [address] [place] [address] Dept: [phone]  FOLLOW UP NOTE  Patient ID: Alison Vega, female    DOB: Jan 26, 1981  Age: 43 y.o. MRN: [id number] Date of Office Visit: 02/23/2024  Assessment  Chief Complaint: Urticaria (Hive episode that started last week. Is still doing Xolair  at home every month. )  HPI Alison Vega is a 43 year old female who presents to the clinic for evaluation of a hive flare.  She was last seen in this clinic on 11/24/2023 by Dr. Burdette Carolin for evaluation of asthma, chronic rhinitis, and chronic urticaria.  At today's visit, she reports that she began to experience small, raised, red, and itchy areas that appeared on Friday started on her hand and spread over her full body within 1-2 days.   She reports her last hive breakout was over a year ago.  She denies new medications, new foods, new personal care products, or insect stings.  She denies recent fever or illness.  She denies recent illness, fever, sweats, or chills.  She reports that nobody at her home is experiencing the same or similar rash.  She does report an increase in stress level over the last week. She continues doxepin  50 mg once a day, Xyzal  5 mg daily, and famotidine  20 mg daily.  She continues Xolair  once every 4 weeks with relief of symptoms.   Asthma is reported as well-controlled with no symptoms including shortness of breath, cough, or wheeze with activity or rest.  She continues Trelegy 200-1 puff daily and has not used albuterol  since her last visit to this clinic.  Chronic rhinitis is reported as moderately well-controlled with occasional sneezing as the main symptom.  She continues Flonase  daily, Xyzal  5 mg daily, and is not currently using a nasal saline rinse.  She had negative testing to the pediatric environmental panel on 09/08/2019.  Her current medications are listed in the chart.  Drug Allergies:  Allergies  Allergen Reactions   Penicillins Shortness Of Breath and Other (See  Comments)    Shut patients throat.   Sulfa Antibiotics Shortness Of Breath and Other (See Comments)    Shuts patient's throat.   Avocado Hives    Physical Exam: BP 138/82   Pulse 67   Temp 98.3 F (36.8 C)   Resp 18   Wt 163 lb (73.9 kg)   SpO2 97%   BMI 30.80 kg/m    Physical Exam Vitals reviewed.  Constitutional:      Appearance: Normal appearance.  HENT:     Head: Normocephalic and atraumatic.     Right Ear: Tympanic membrane normal.     Left Ear: Tympanic membrane normal.     Nose:     Comments: Bilateral nares normal.  Pharynx normal.  Ears normal.  Eyes normal.    Mouth/Throat:     Pharynx: Oropharynx is clear.  Eyes:     Conjunctiva/sclera: Conjunctivae normal.  Cardiovascular:     Rate and Rhythm: Normal rate and regular rhythm.     Heart sounds: Normal heart sounds. No murmur heard. Pulmonary:     Effort: Pulmonary effort is normal.     Breath sounds: Normal breath sounds.     Comments: Lungs clear to auscultation Musculoskeletal:        General: Normal range of motion.     Cervical back: Normal range of motion and neck supple.  Skin:    General: Skin is warm and dry.     Comments: Scattered raised and red areas  covering her body.  No open areas or drainage noted.  Neurological:     Mental Status: She is alert and oriented to person, place, and time.  Psychiatric:        Mood and Affect: Mood normal.        Behavior: Behavior normal.        Thought Content: Thought content normal.        Judgment: Judgment normal.    Assessment and Plan: 1. Chronic idiopathic urticaria   2. 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 Chronic rhinitis     Meds ordered this encounter  Medications   predniSONE  (DELTASONE ) 10 MG tablet    Sig: Begin prednisone  10 mg tablets. Take 2 tablets twice a day for 3 days, then take 2 tablets once a day for 1 day, then take 1 tablet on the 5th day, then stop    Dispense:  15 tablet    Refill:  0    Patient  Instructions  Asthma Continue Trelegy 200-1 puff once a day to prevent cough or wheeze Continue albuterol  2 puffs once every 4 hours if needed for cough or wheeze You may use albuterol  2 puffs 5 to 15 minutes before activity to decrease cough or wheeze  Chronic rhinitis Continue Xyzal  5 mg once a day if needed for runny nose. You may take an additional dose of Xyzal  5 mg once a day if needed for breakthrough symptoms Continue Flonase  2 sprays in each nostril once a day if needed for stuffy nose Consider saline nasal rinses as needed for nasal symptoms. Use this before any medicated nasal sprays for best result  Chronic urticaria Begin prednisone  10 mg tablets. Take 2 tablets twice a day for 3 days, then take 2 tablets once a day for 1 day, then take 1 tablet on the 5th day, then stop  Take the least amount of medications while remaining hive free Levocetirizine (Xyzal ) 5 mg twice a day and famotidine  (Pepcid ) 20 mg twice a day. If no symptoms for 7-14 days then decrease to. Levocetirizine (Xyzal ) 5 mg twice a day and famotidine  (Pepcid ) 20 mg once a day.  If no symptoms for 7-14 days then decrease to. Levocetirizine (Xyzal ) 5 mg twice a day.  If no symptoms for 7-14 days then decrease to. Levocetirizine (Xyzal ) 5 mg once a day.  Continue doxepin  50 mg once at nighttime Continue Xolair  once every 4 weeks and have access to an epinephrine  autoinjector set per protocol  Call the clinic if this treatment plan is not working well for you.  Follow up in 3 months or sooner if needed.  Return in about 3 months (around 05/24/2024), or if symptoms worsen or fail to improve.    Thank you for the opportunity to care for this patient.  Please do not hesitate to contact me with questions.  Marinus Sic, FNP [facility] 

## 2024-02-24 ENCOUNTER — Ambulatory Visit: Admitting: Family

## 2024-02-24 ENCOUNTER — Other Ambulatory Visit: Payer: Self-pay

## 2024-02-24 ENCOUNTER — Other Ambulatory Visit (HOSPITAL_COMMUNITY): Payer: Self-pay

## 2024-02-24 ENCOUNTER — Other Ambulatory Visit: Payer: Self-pay | Admitting: Family Medicine

## 2024-02-25 ENCOUNTER — Other Ambulatory Visit: Payer: Self-pay

## 2024-02-25 ENCOUNTER — Telehealth: Payer: Self-pay

## 2024-02-25 NOTE — Progress Notes (Signed)
 Specialty Pharmacy Refill Coordination Note  Alison Vega is a 43 y.o. female contacted today regarding refills of specialty medication(s) Omalizumab  (XOLAIR )   Patient requested (Patient-Rptd) Delivery   Delivery date: 02/26/24   Verified address: (Patient-Rptd) [address] [place] [address]   Medication will be filled on 02/25/24.

## 2024-02-25 NOTE — Telephone Encounter (Signed)
 Copied from CRM [id number]. Topic: Clinical - Prescription Issue >> Feb 25, 2024 10:25 AM Turkey A wrote: Reason for CRM: Patient called because her Risperidone  0.25 MG TBDP has been on back order for two months at pharmacy. Patient wants to know if there is a different medication that can be prescribed

## 2024-02-25 NOTE — Telephone Encounter (Signed)
 I would recommend she try a different pharmacy to see if they have it in stock or ask her current pharmacy what dose they do have stock and maybe we can split a higher dose etc. .

## 2024-02-25 NOTE — Telephone Encounter (Signed)
 Please check at the [facility] pharmacy. thx

## 2024-02-25 NOTE — Telephone Encounter (Signed)
 Please inform patient we have found a medication at work on pharmacy and if transferred there can be available by the weekend.  Please provide her with the contact information to the pharmacy, so that they can transfer her prescription

## 2024-02-26 ENCOUNTER — Other Ambulatory Visit: Payer: Self-pay

## 2024-02-26 ENCOUNTER — Other Ambulatory Visit (HOSPITAL_BASED_OUTPATIENT_CLINIC_OR_DEPARTMENT_OTHER): Payer: Self-pay

## 2024-02-26 ENCOUNTER — Other Ambulatory Visit (HOSPITAL_COMMUNITY): Payer: Self-pay

## 2024-02-29 ENCOUNTER — Other Ambulatory Visit (HOSPITAL_COMMUNITY): Payer: Self-pay

## 2024-02-29 NOTE — Telephone Encounter (Signed)
 Reason for CRM: Pt called to advise her provider that she has tried 3 different pharmacys, no one hs Risperidone  0.25 MG TBDP .they do not know when they will get it. It's on back order. Pt is requesting another medication please call pt back at  [phone]   See MyChart [facility].

## 2024-02-29 NOTE — Telephone Encounter (Signed)
 We just went through this last week and we found the medication for her through our [facility] pharmacy.  Is she aware the medication can be transferred to the [facility] pharmacy?  Or is she stating that the common pharmacy now says that they do not have it in stock?   There are not many medications in this class, they can be used like this risperidone  can.  If we are able to keep her on the same medication that is preferred.  Please make her aware.

## 2024-03-08 MED ORDER — BUSPIRONE HCL 10 MG PO TABS: 10.0000 mg | ORAL_TABLET | Freq: Two times a day (BID) | ORAL | 5 refills | Status: DC

## 2024-03-08 NOTE — Telephone Encounter (Signed)
 Buspar prescribed in place of risperidone 

## 2024-03-08 NOTE — Addendum Note (Signed)
 Addended by: Napolean Backbone A on: 03/08/2024 02:41 PM   Modules accepted: Orders

## 2024-03-18 ENCOUNTER — Other Ambulatory Visit: Payer: Self-pay

## 2024-03-18 NOTE — Progress Notes (Signed)
 Specialty Pharmacy Refill Coordination Note  Alison Vega is a 43 y.o. female contacted today regarding refills of specialty medication(s) Omalizumab  (XOLAIR )   Patient requested (Patient-Rptd) Delivery   Delivery date: 04/08/24   Verified address: [address] [name] [address]   Medication will be filled on 04/07/24.

## 2024-03-23 DIAGNOSIS — M47812 Spondylosis without myelopathy or radiculopathy, cervical region: Secondary | ICD-10-CM | POA: Diagnosis not present

## 2024-03-23 DIAGNOSIS — M21611 Bunion of right foot: Secondary | ICD-10-CM | POA: Diagnosis not present

## 2024-03-23 DIAGNOSIS — F319 Bipolar disorder, unspecified: Secondary | ICD-10-CM | POA: Diagnosis not present

## 2024-03-23 DIAGNOSIS — R030 Elevated blood-pressure reading, without diagnosis of hypertension: Secondary | ICD-10-CM | POA: Diagnosis not present

## 2024-03-23 DIAGNOSIS — Z79899 Other long term (current) drug therapy: Secondary | ICD-10-CM | POA: Diagnosis not present

## 2024-03-23 DIAGNOSIS — R29818 Other symptoms and signs involving the nervous system: Secondary | ICD-10-CM | POA: Diagnosis not present

## 2024-03-23 DIAGNOSIS — R300 Dysuria: Secondary | ICD-10-CM | POA: Diagnosis not present

## 2024-03-23 DIAGNOSIS — M4186 Other forms of scoliosis, lumbar region: Secondary | ICD-10-CM | POA: Diagnosis not present

## 2024-03-23 DIAGNOSIS — E6609 Other obesity due to excess calories: Secondary | ICD-10-CM | POA: Diagnosis not present

## 2024-03-23 DIAGNOSIS — E611 Iron deficiency: Secondary | ICD-10-CM | POA: Diagnosis not present

## 2024-03-24 ENCOUNTER — Telehealth: Payer: Self-pay

## 2024-03-24 NOTE — Telephone Encounter (Signed)
 Noted

## 2024-03-24 NOTE — Telephone Encounter (Signed)
 Please see note below.  Copied from CRM [id number]. Topic: Clinical - Medication Question >> Mar 23, 2024  6:00 PM Shereese L wrote: Reason for UXL:[id number] called to verify if we received an order for a script change. Advised him to resend to fax

## 2024-03-25 DIAGNOSIS — Z79899 Other long term (current) drug therapy: Secondary | ICD-10-CM | POA: Diagnosis not present

## 2024-04-06 ENCOUNTER — Telehealth: Payer: Self-pay

## 2024-04-06 NOTE — Telephone Encounter (Signed)
 Received a new for and placed in the s drive folder for PCP

## 2024-04-06 NOTE — Telephone Encounter (Signed)
Placed in PCP office for review

## 2024-04-06 NOTE — Telephone Encounter (Signed)
 Reason for CRM: kamaran from [employer] on the behalf of [employer] wanted to check on fax that was sent 5/21 for prescription change for patient and is resending it, will need Dr. Marylee Snowball to sign and send back confirmation of received

## 2024-04-06 NOTE — Telephone Encounter (Signed)
 Copied from CRM [id number]. Topic: General - Other >> Apr 06, 2024  9:28 AM Emmet Harm C wrote: Reason for CRM: kamaran from [employer] on the behalf of [employer] wanted to check on fax that was sent 5/21 for prescription change for patient and is resending it, will need Dr. Marylee Snowball to sign and send back confirmation of received

## 2024-04-07 ENCOUNTER — Other Ambulatory Visit: Payer: Self-pay

## 2024-04-29 ENCOUNTER — Ambulatory Visit (INDEPENDENT_AMBULATORY_CARE_PROVIDER_SITE_OTHER): Admitting: Podiatry

## 2024-04-29 ENCOUNTER — Encounter: Payer: Self-pay | Admitting: Podiatry

## 2024-04-29 ENCOUNTER — Ambulatory Visit (INDEPENDENT_AMBULATORY_CARE_PROVIDER_SITE_OTHER)

## 2024-04-29 DIAGNOSIS — M79671 Pain in right foot: Secondary | ICD-10-CM | POA: Diagnosis not present

## 2024-04-29 DIAGNOSIS — M25571 Pain in right ankle and joints of right foot: Secondary | ICD-10-CM

## 2024-04-29 DIAGNOSIS — M21611 Bunion of right foot: Secondary | ICD-10-CM

## 2024-04-29 DIAGNOSIS — M21619 Bunion of unspecified foot: Secondary | ICD-10-CM

## 2024-04-29 MED ORDER — TRIAMCINOLONE ACETONIDE 10 MG/ML IJ SUSP
10.0000 mg | Freq: Once | INTRAMUSCULAR | Status: AC
Start: 2024-04-29 — End: 2024-04-29
  Administered 2024-04-29: 10 mg

## 2024-04-29 NOTE — Progress Notes (Signed)
 Patient presents with complaint of pain over the first metatarsal phalangeal joint of the right foot.  Has been bothering her for several years and getting worse.  The gotten extremely painful recently.  Has not noticed any redness or swelling around the joint.  Does not recall any injury.   Physical exam:  General appearance: Pleasant, and in no acute distress. AOx3.  Vascular: Pedal pulses: DP 2 to/4 bilaterally, PT 2/4 bilaterally.  Minimal edema lower legs bilaterally. Capillary fill time immediate bilaterally.  Neurological: Light touch intact feet bilaterally.  Normal Achilles reflex bilaterally.  No clonus or spasticity noted.   Dermatologic:   Skin normal temperature bilaterally.  Skin normal color, tone, and texture bilaterally.   Musculoskeletal: Hallux abductovalgus right tenderness on the dorsal and medial aspect the first metatarsal phalangeal joint right.  Tenderness with range of motion first MTP right.  No crepitation noted with range of motion.  Good range of motion of the first MTP right  Radiographs: 3 views right foot: Hallux abductovalgus Forni with increased intermetatarsal angle.  No evidence of any fractures no arthrosis noted.  Good bone density.  No Insa any bone tumors.  Diagnosis: 1.  Pain right foot 2.  Arthralgia first metatarsal phalangeal joint right foot. 3.  Hallux abductovalgus right  Plan: -New patient visit office level 3 for evaluation and management.  Modifier 25 - Discussed with her pain in the joint and the bunion deformity.  Discussed conservative versus surgical options for the bunion deformity.  Will try an injection to get the inflammation down to give her some comfort.  Discussed wear the proper shoes.  Recommended RICE. -injected 3cc 2:1 mixture 0.5 cc Marcaine :Kenolog 10mg /42ml at first metatarsal phalangeal joint right.     Return 2 weeks follow-up injection right

## 2024-05-09 ENCOUNTER — Encounter (INDEPENDENT_AMBULATORY_CARE_PROVIDER_SITE_OTHER): Payer: Self-pay

## 2024-05-10 ENCOUNTER — Other Ambulatory Visit: Payer: Self-pay | Admitting: Pharmacy Technician

## 2024-05-10 ENCOUNTER — Other Ambulatory Visit: Payer: Self-pay

## 2024-05-10 NOTE — Progress Notes (Signed)
 Specialty Pharmacy Refill Coordination Note  Alison Vega is a 43 y.o. female contacted today regarding refills of specialty medication(s) Omalizumab  (XOLAIR )   Patient requested (Patient-Rptd) Delivery   Delivery date: 05/25/2024 Verified address: (Patient-Rptd) [address] [place] [address]   Medication will be filled on 05/24/2024.

## 2024-05-16 ENCOUNTER — Ambulatory Visit (INDEPENDENT_AMBULATORY_CARE_PROVIDER_SITE_OTHER): Admitting: Podiatry

## 2024-05-16 ENCOUNTER — Encounter: Payer: Self-pay | Admitting: Podiatry

## 2024-05-16 DIAGNOSIS — M25571 Pain in right ankle and joints of right foot: Secondary | ICD-10-CM

## 2024-05-16 DIAGNOSIS — M7751 Other enthesopathy of right foot: Secondary | ICD-10-CM | POA: Diagnosis not present

## 2024-05-16 MED ORDER — TRIAMCINOLONE ACETONIDE 10 MG/ML IJ SUSP
10.0000 mg | Freq: Once | INTRAMUSCULAR | Status: AC
  Administered 2024-05-16: 10 mg

## 2024-05-16 NOTE — Progress Notes (Signed)
 Patient presents follow-up injection first metatarsal phalangeal joint right foot.  Doing about 60 to 70% better but still painful when walking and doing activities.  Has not noticed any redness   Physical exam:  General appearance: Pleasant, and in no acute distress. AOx3.  Vascular: Pedal pulses: DP 2/4 bilaterally, PT 2/4 bilaterally.  Mild edema lower legs bilaterally. Capillary fill time immediate right.  Neurological: Grossly intact.  Dermatologic:   Skin normal temperature bilaterally.  Skin normal color, tone, and texture bilaterally.   Musculoskeletal: Tenderness to palpation on the dorsal aspect of the first metatarsal phalangeal joint and with range of motion right.  Some tenderness on the plantar medial aspect of the first metatarsal phalangeal joint    Diagnosis: 1.  Arthralgia first metatarsal phalangeal joint right  Plan: -Stilling discomfort at first metatarsal phalangeal joint will try second injection.  Continue icing as needed -injected 3cc 2:1 mixture 0.5 cc Marcaine :Kenolog 10mg /35ml at first metatarsal phalangeal joint right foot.    Return 2 weeks follow-up injection first MTP right

## 2024-05-23 NOTE — Progress Notes (Deleted)
 Follow Up Note  RE: Alison Vega MRN: [id number] DOB: September 09, 1981 Date of Office Visit: 05/24/2024  Referring provider: Catherine Charlies LABOR, DO Primary care provider: Catherine Charlies LABOR, DO  Chief Complaint: No chief complaint on file.  History of Present Illness: I had the pleasure of seeing Alison Vega for a follow up visit at the [facility] on 05/24/2024. She is a 43 y.o. female, who is being followed for asthma, chronic rhinitis, chronic urticaria on Xolair . Her previous allergy  office visit was on 02/23/2024 with Arlean Mutter, FNP. Today is a regular follow up visit.  Discussed the use of AI scribe software for clinical note transcription with the patient, who gave verbal consent to proceed.  History of Present Illness            ***  Assessment and Plan: Alison Vega is a 44 y.o. female with:  Chronic idiopathic urticaria Past history - Lab evaluation unremarkable (FCeRI antibody, anti-thyroglobulin antibody, thyroid  peroxidase antibody, tryptase, H. pylori serology, CBC, CMP, ESR, ANA, and alpha-gal panel).  Interim history - No significant outbreaks recently. Noted a new rash on face today, possibly related to cold exposure. Xolair  injections every 4 weeks, administered at home.  Keep an eye on your facial rash - let me know if it gets worse.             Take pictures.  Continue Xolair  300mg  every 4 weeks. Let me know if I need to refill the epinephrine  prescription.  Take the following medications:  Doxepin  50mg  once a day at night.  Xyzal  (levocetirizine) 5mg  once a day in the morning.  Pepcid  (famotidine ) 20mg  1-2 times per day.  Avoid the following potential triggers: alcohol, tight clothing, NSAIDs, hot showers and getting overheated. If doing well at next visit, will plan on weaning off some of the medications.   Moderate persistent asthma without complication Past history - normal CXR in 2021. Symbicort  and Breo too expensive. Prednisone  in December  2022.  Interim history - No recent exacerbations or need for rescue inhaler. Today's spirometry was normal. Daily controller medication(s): continue Trelegy 200mcg 1 puff once a day and rinse mouth after each use.  May use albuterol  rescue inhaler 2 puffs every 4 to 6 hours as needed for shortness of breath, chest tightness, coughing, and wheezing. May use albuterol  rescue inhaler 2 puffs 5 to 15 minutes prior to strenuous physical activities. Monitor frequency of use - if you need to use it more than twice per week on a consistent basis let us  know.    Chronic rhinitis Past history - 2020 skin testing negative to pediatric environmental panel.  Interim history - some nasal congestion recently. Use Flonase  (fluticasone ) nasal spray 1-2 sprays per nostril once a day as needed for nasal congestion.  Nasal saline spray (i.e., Simply Saline) or nasal saline lavage (i.e., NeilMed) is recommended as needed and prior to medicated nasal sprays. May take Xyzal  (levocetirizine) 5mg  1-2 times daily as needed.  Asthma Continue Trelegy 200-1 puff once a day to prevent cough or wheeze Continue albuterol  2 puffs once every 4 hours if needed for cough or wheeze You may use albuterol  2 puffs 5 to 15 minutes before activity to decrease cough or wheeze   Chronic rhinitis Continue Xyzal  5 mg once a day if needed for runny nose. You may take an additional dose of Xyzal  5 mg once a day if needed for breakthrough symptoms Continue Flonase  2 sprays in each nostril once a  day if needed for stuffy nose Consider saline nasal rinses as needed for nasal symptoms. Use this before any medicated nasal sprays for best result   Chronic urticaria Begin prednisone  10 mg tablets. Take 2 tablets twice a day for 3 days, then take 2 tablets once a day for 1 day, then take 1 tablet on the 5th day, then stop  Take the least amount of medications while remaining hive free Levocetirizine (Xyzal ) 5 mg twice a day and famotidine   (Pepcid ) 20 mg twice a day. If no symptoms for 7-14 days then decrease to. Levocetirizine (Xyzal ) 5 mg twice a day and famotidine  (Pepcid ) 20 mg once a day.  If no symptoms for 7-14 days then decrease to. Levocetirizine (Xyzal ) 5 mg twice a day.  If no symptoms for 7-14 days then decrease to. Levocetirizine (Xyzal ) 5 mg once a day.  Continue doxepin  50 mg once at nighttime Continue Xolair  once every 4 weeks and have access to an epinephrine  autoinjector set per protocol Assessment and Plan              No follow-ups on file.  No orders of the defined types were placed in this encounter.  Lab Orders  No laboratory test(s) ordered today    Diagnostics: Spirometry:  Tracings reviewed. Her effort: {Blank single:19197::Good reproducible efforts.,It was hard to get consistent efforts and there is a question as to whether this reflects a maximal maneuver.,Poor effort, data can not be interpreted.} FVC: ***L FEV1: ***L, ***% predicted FEV1/FVC ratio: ***% Interpretation: {Blank single:19197::Spirometry consistent with mild obstructive disease,Spirometry consistent with moderate obstructive disease,Spirometry consistent with severe obstructive disease,Spirometry consistent with possible restrictive disease,Spirometry consistent with mixed obstructive and restrictive disease,Spirometry uninterpretable due to technique,Spirometry consistent with normal pattern,No overt abnormalities noted given today's efforts}.  Please see scanned spirometry results for details.  Skin Testing: {Blank single:19197::Select foods,Environmental allergy  panel,Environmental allergy  panel and select foods,Food allergy  panel,None,Deferred due to recent antihistamines use}. *** Results discussed with patient/family.   Medication List:  Current Outpatient Medications  Medication Sig Dispense Refill   albuterol  (VENTOLIN  HFA) 108 (90 Base) MCG/ACT inhaler Inhale 2 puffs into the  lungs every 4 (four) hours as needed for wheezing or shortness of breath (coughing fits). 18 g 1   busPIRone  (BUSPAR ) 10 MG tablet Take 1 tablet (10 mg total) by mouth 2 (two) times daily. 60 tablet 5   diclofenac  (VOLTAREN ) 75 MG EC tablet Take 75 mg by mouth 2 (two) times daily.     doxepin  (SINEQUAN ) 50 MG capsule Take 1 capsule (50 mg total) by mouth at bedtime. 90 capsule 1   EPINEPHrine  0.3 mg/0.3 mL IJ SOAJ injection Inject 0.3 mg into the muscle as needed for anaphylaxis. 2 each 1   famotidine  (PEPCID ) 20 MG tablet Take 1 tablet (20 mg total) by mouth 2 (two) times daily. 60 tablet 3   fluticasone  (FLONASE ) 50 MCG/ACT nasal spray SHAKE LIQUID AND USE 1 TO 2 SPRAYS IN EACH NOSTRIL DAILY AS NEEDED FOR RHINITIS 16 g 5   Fluticasone -Umeclidin-Vilant (TRELEGY ELLIPTA ) 200-62.5-25 MCG/ACT AEPB Inhale 1 puff into the lungs daily. Rinse mouth after each use. 60 each 5   levocetirizine (XYZAL ) 5 MG tablet Take 1 tablet (5 mg total) by mouth 2 (two) times daily as needed (hives). 60 tablet 5   naloxone (NARCAN) nasal spray 4 mg/0.1 mL SMARTSIG:Both Nares     omalizumab  (XOLAIR ) 150 MG/ML prefilled syringe Inject 300 mg into the skin every 28 (twenty-eight) days. 2 mL 11  predniSONE  (DELTASONE ) 10 MG tablet Begin prednisone  10 mg tablets. Take 2 tablets twice a day for 3 days, then take 2 tablets once a day for 1 day, then take 1 tablet on the 5th day, then stop (Patient not taking: Reported on 05/16/2024) 15 tablet 0   pregabalin  (LYRICA ) 75 MG capsule Take 1 capsule (75 mg total) by mouth every 12 (twelve) hours. 180 capsule 1   solifenacin  (VESICARE ) 5 MG tablet Take 1 tablet (5 mg total) by mouth daily. 30 tablet 5   SPIRIVA  RESPIMAT 1.25 MCG/ACT AERS SMARTSIG:2 Puff(s) Via Inhaler Daily     tiZANidine  (ZANAFLEX ) 4 MG tablet Take 1 tablet (4 mg total) by mouth at bedtime. 90 tablet 1   traMADol (ULTRAM) 50 MG tablet Take 50 mg by mouth every 6 (six) hours as needed.     Vitamin D , Ergocalciferol ,  (DRISDOL ) 1.25 MG (50000 UNIT) CAPS capsule Take 1 capsule (50,000 Units total) by mouth every 7 (seven) days. 12 capsule 3   Current Facility-Administered Medications  Medication Dose Route Frequency Provider Last Rate Last Admin   omalizumab  (XOLAIR ) injection 300 mg  300 mg Subcutaneous Q28 days Luke Orlan HERO, DO   300 mg at 06/05/23 9063   Allergies: Allergies  Allergen Reactions   Penicillins Shortness Of Breath and Other (See Comments)    Shut patients throat.   Sulfa Antibiotics Shortness Of Breath and Other (See Comments)    Shuts patient's throat.   Avocado Hives   I reviewed her past medical history, social history, family history, and environmental history and no significant changes have been reported from her previous visit.  Review of Systems  Constitutional:  Negative for appetite change, chills, fever and unexpected weight change.  HENT:  Positive for congestion. Negative for rhinorrhea.   Eyes:  Negative for itching.  Respiratory:  Negative for cough, chest tightness, shortness of breath and wheezing.   Gastrointestinal:  Negative for abdominal pain.  Musculoskeletal:  Positive for back pain.  Skin:  Negative for rash.  Allergic/Immunologic: Negative for environmental allergies and food allergies.  Neurological:  Negative for headaches.    Objective: There were no vitals taken for this visit. There is no height or weight on file to calculate BMI. Physical Exam Vitals and nursing note reviewed.  Constitutional:      Appearance: Normal appearance. She is well-developed.  HENT:     Head: Normocephalic and atraumatic.     Right Ear: Tympanic membrane and external ear normal.     Left Ear: Tympanic membrane and external ear normal.     Nose: Nose normal.     Mouth/Throat:     Mouth: Mucous membranes are moist.     Pharynx: Oropharynx is clear.  Eyes:     Conjunctiva/sclera: Conjunctivae normal.  Cardiovascular:     Rate and Rhythm: Normal rate and regular  rhythm.     Heart sounds: Normal heart sounds. No murmur heard.    No friction rub. No gallop.  Pulmonary:     Effort: Pulmonary effort is normal.     Breath sounds: Normal breath sounds. No wheezing or rales.  Musculoskeletal:     Cervical back: Neck supple.  Skin:    General: Skin is warm.     Findings: No rash.     Comments: Few circular erythematous patches on face b/l.  Neurological:     Mental Status: She is alert and oriented to person, place, and time.  Psychiatric:  Behavior: Behavior normal.    Previous notes and tests were reviewed. The plan was reviewed with the patient/family, and all questions/concerned were addressed.  It was my pleasure to see Alison Vega today and participate in her care. Please feel free to contact me with any questions or concerns.  Sincerely,  Orlan Cramp, DO Allergy  & Immunology  [facility]  [place] office: [phone] [facility] office: [phone]

## 2024-05-24 ENCOUNTER — Ambulatory Visit: Payer: Medicaid Other | Admitting: Allergy

## 2024-05-24 ENCOUNTER — Other Ambulatory Visit: Payer: Self-pay

## 2024-05-24 ENCOUNTER — Other Ambulatory Visit (HOSPITAL_COMMUNITY): Payer: Self-pay

## 2024-05-24 DIAGNOSIS — J4540 Moderate persistent asthma, uncomplicated: Secondary | ICD-10-CM

## 2024-05-24 DIAGNOSIS — J310 Chronic rhinitis: Secondary | ICD-10-CM

## 2024-05-24 DIAGNOSIS — L501 Idiopathic urticaria: Secondary | ICD-10-CM

## 2024-05-24 NOTE — Progress Notes (Signed)
 MyChart message sent to patient.

## 2024-05-25 ENCOUNTER — Other Ambulatory Visit: Payer: Self-pay

## 2024-05-26 ENCOUNTER — Other Ambulatory Visit: Payer: Self-pay

## 2024-05-27 ENCOUNTER — Other Ambulatory Visit: Payer: Self-pay

## 2024-05-27 NOTE — Progress Notes (Signed)
 Patient was contacted regarding termination of primary coverage via MyChart by Alison Vega. Message has been read by patient. Patient must provide new primary coverage or contact Medicaid to have preimary coverage flag removed. Profiling medication.

## 2024-05-30 ENCOUNTER — Ambulatory Visit: Admitting: Podiatry

## 2024-06-17 ENCOUNTER — Other Ambulatory Visit: Payer: Self-pay

## 2024-06-17 ENCOUNTER — Other Ambulatory Visit (HOSPITAL_COMMUNITY): Payer: Self-pay

## 2024-06-17 ENCOUNTER — Other Ambulatory Visit: Payer: Self-pay | Admitting: Allergy

## 2024-06-17 ENCOUNTER — Encounter (INDEPENDENT_AMBULATORY_CARE_PROVIDER_SITE_OTHER): Payer: Self-pay

## 2024-06-17 NOTE — Progress Notes (Signed)
 Specialty Pharmacy Refill Coordination Note  MyChart Questionnaire Submission  Alison Vega is a 43 y.o. female contacted today regarding refills of specialty medication(s) Xolair .  Doses on hand: (Patient-Rptd) 0   Injection date: (Patient-Rptd) 07/02/24  Patient requested: (Patient-Rptd) Delivery   Delivery date: 06/22/24  Verified address: [address] [place] [address]  Medication will be filled on 06/21/24.    This fill date is pending response to refill request from provider. Patient is aware and if they have not received fill by intended date, they must follow up with pharmacy.

## 2024-06-20 MED ORDER — OMALIZUMAB 150 MG/ML ~~LOC~~ SOSY
300.0000 mg | PREFILLED_SYRINGE | SUBCUTANEOUS | 11 refills | Status: AC
  Filled 2024-06-21 – 2024-07-21 (×4): qty 2, 28d supply, fill #0
  Filled 2024-08-15: qty 2, 28d supply, fill #1
  Filled 2024-09-07: qty 2, 28d supply, fill #2

## 2024-06-21 ENCOUNTER — Other Ambulatory Visit: Payer: Self-pay

## 2024-06-21 ENCOUNTER — Other Ambulatory Visit (HOSPITAL_COMMUNITY): Payer: Self-pay

## 2024-06-22 ENCOUNTER — Other Ambulatory Visit (HOSPITAL_COMMUNITY): Payer: Self-pay

## 2024-06-22 ENCOUNTER — Other Ambulatory Visit: Payer: Self-pay

## 2024-06-24 ENCOUNTER — Other Ambulatory Visit (HOSPITAL_COMMUNITY): Payer: Self-pay

## 2024-06-24 ENCOUNTER — Other Ambulatory Visit: Payer: Self-pay

## 2024-06-27 ENCOUNTER — Other Ambulatory Visit: Payer: Self-pay

## 2024-06-28 ENCOUNTER — Other Ambulatory Visit: Payer: Self-pay

## 2024-06-30 ENCOUNTER — Other Ambulatory Visit: Payer: Self-pay

## 2024-07-05 ENCOUNTER — Other Ambulatory Visit: Payer: Self-pay

## 2024-07-07 ENCOUNTER — Other Ambulatory Visit: Payer: Self-pay

## 2024-07-12 ENCOUNTER — Other Ambulatory Visit: Payer: Self-pay

## 2024-07-18 ENCOUNTER — Telehealth: Payer: Self-pay

## 2024-07-18 ENCOUNTER — Other Ambulatory Visit (HOSPITAL_COMMUNITY): Payer: Self-pay

## 2024-07-18 NOTE — Telephone Encounter (Signed)
*  AA  Pharmacy Patient Advocate Encounter   Received notification from CoverMyMeds that prior authorization for Trelegy Ellipta  200-62.5-25MCG/ACT aerosol powder   is required/requested.   Insurance verification completed.   The patient is insured through [facility] .   Per test claim: PA required; PA submitted to above mentioned insurance via Latent Key/confirmation #/EOC [id number] Status is pending

## 2024-07-18 NOTE — Telephone Encounter (Signed)
 Your request has been approved Request Reference Number: [id number]. TRELEGY AER is approved through 07/18/2025. For further questions, call [employer] at [phone]. Authorization Expiration09/15/2026

## 2024-07-21 ENCOUNTER — Other Ambulatory Visit (HOSPITAL_COMMUNITY): Payer: Self-pay

## 2024-07-21 ENCOUNTER — Other Ambulatory Visit: Payer: Self-pay

## 2024-07-21 NOTE — Progress Notes (Signed)
 Specialty Pharmacy Refill Coordination Note  Alison Vega is a 43 y.o. female contacted today regarding refills of specialty medication(s) Omalizumab  (XOLAIR )   Patient requested Delivery   Delivery date: 07/22/24   Verified address: [address] [place], [address]   Medication will be filled on 07/21/24.

## 2024-07-21 NOTE — Telephone Encounter (Signed)
 Test claim now shows issue is fixed. Message sent to the pharmacy to proceed with fill.

## 2024-07-21 NOTE — Progress Notes (Signed)
 Patient contacted Social Worker to fix other coverage message popping up as patient reports she does not have other coverage. Test claim is now showing the Xolair  going through for $4.00 copay. Please proceed with fill.

## 2024-07-26 DIAGNOSIS — Z0000 Encounter for general adult medical examination without abnormal findings: Secondary | ICD-10-CM | POA: Diagnosis not present

## 2024-07-26 DIAGNOSIS — Z131 Encounter for screening for diabetes mellitus: Secondary | ICD-10-CM | POA: Diagnosis not present

## 2024-07-26 DIAGNOSIS — F319 Bipolar disorder, unspecified: Secondary | ICD-10-CM | POA: Diagnosis not present

## 2024-07-26 DIAGNOSIS — M4186 Other forms of scoliosis, lumbar region: Secondary | ICD-10-CM | POA: Diagnosis not present

## 2024-07-26 DIAGNOSIS — M62838 Other muscle spasm: Secondary | ICD-10-CM | POA: Diagnosis not present

## 2024-07-26 DIAGNOSIS — Z6829 Body mass index (BMI) 29.0-29.9, adult: Secondary | ICD-10-CM | POA: Diagnosis not present

## 2024-07-26 DIAGNOSIS — G629 Polyneuropathy, unspecified: Secondary | ICD-10-CM | POA: Diagnosis not present

## 2024-07-26 DIAGNOSIS — Z79899 Other long term (current) drug therapy: Secondary | ICD-10-CM | POA: Diagnosis not present

## 2024-07-26 DIAGNOSIS — M47812 Spondylosis without myelopathy or radiculopathy, cervical region: Secondary | ICD-10-CM | POA: Diagnosis not present

## 2024-07-26 DIAGNOSIS — E611 Iron deficiency: Secondary | ICD-10-CM | POA: Diagnosis not present

## 2024-07-27 DIAGNOSIS — E611 Iron deficiency: Secondary | ICD-10-CM | POA: Diagnosis not present

## 2024-07-27 DIAGNOSIS — M21611 Bunion of right foot: Secondary | ICD-10-CM | POA: Diagnosis not present

## 2024-07-27 DIAGNOSIS — M62838 Other muscle spasm: Secondary | ICD-10-CM | POA: Diagnosis not present

## 2024-07-27 DIAGNOSIS — Z3200 Encounter for pregnancy test, result unknown: Secondary | ICD-10-CM | POA: Diagnosis not present

## 2024-07-27 DIAGNOSIS — Z79899 Other long term (current) drug therapy: Secondary | ICD-10-CM | POA: Diagnosis not present

## 2024-07-27 DIAGNOSIS — M47812 Spondylosis without myelopathy or radiculopathy, cervical region: Secondary | ICD-10-CM | POA: Diagnosis not present

## 2024-07-27 DIAGNOSIS — G629 Polyneuropathy, unspecified: Secondary | ICD-10-CM | POA: Diagnosis not present

## 2024-07-27 DIAGNOSIS — F319 Bipolar disorder, unspecified: Secondary | ICD-10-CM | POA: Diagnosis not present

## 2024-07-27 DIAGNOSIS — R300 Dysuria: Secondary | ICD-10-CM | POA: Diagnosis not present

## 2024-07-27 DIAGNOSIS — M4186 Other forms of scoliosis, lumbar region: Secondary | ICD-10-CM | POA: Diagnosis not present

## 2024-08-01 DIAGNOSIS — M47812 Spondylosis without myelopathy or radiculopathy, cervical region: Secondary | ICD-10-CM | POA: Diagnosis not present

## 2024-08-01 DIAGNOSIS — M4186 Other forms of scoliosis, lumbar region: Secondary | ICD-10-CM | POA: Diagnosis not present

## 2024-08-15 ENCOUNTER — Other Ambulatory Visit (HOSPITAL_COMMUNITY): Payer: Self-pay

## 2024-08-16 NOTE — Telephone Encounter (Signed)
 No further action needed.

## 2024-08-17 ENCOUNTER — Other Ambulatory Visit: Payer: Self-pay

## 2024-08-17 ENCOUNTER — Other Ambulatory Visit: Payer: Self-pay | Admitting: Pharmacy Technician

## 2024-08-17 NOTE — Progress Notes (Signed)
 Specialty Pharmacy Refill Coordination Note  Alison Vega is a 43 y.o. female contacted today regarding refills of specialty medication(s) Omalizumab  (XOLAIR )   Patient requested Delivery   Delivery date: 08/18/24   Verified address: [address] [place] [address]   Medication will be filled on 08/17/24.

## 2024-08-19 ENCOUNTER — Other Ambulatory Visit: Payer: Self-pay

## 2024-08-22 ENCOUNTER — Other Ambulatory Visit: Payer: Self-pay

## 2024-08-22 ENCOUNTER — Ambulatory Visit: Admitting: Family Medicine

## 2024-08-22 ENCOUNTER — Encounter: Payer: Self-pay | Admitting: Family Medicine

## 2024-08-22 VITALS — BP 128/82 | HR 87 | Temp 98.4°F | Ht 62.0 in | Wt 162.4 lb

## 2024-08-22 DIAGNOSIS — Z23 Encounter for immunization: Secondary | ICD-10-CM

## 2024-08-22 DIAGNOSIS — Z1231 Encounter for screening mammogram for malignant neoplasm of breast: Secondary | ICD-10-CM | POA: Diagnosis not present

## 2024-08-22 DIAGNOSIS — E6609 Other obesity due to excess calories: Secondary | ICD-10-CM

## 2024-08-22 DIAGNOSIS — E66811 Obesity, class 1: Secondary | ICD-10-CM | POA: Diagnosis not present

## 2024-08-22 DIAGNOSIS — Z13220 Encounter for screening for lipoid disorders: Secondary | ICD-10-CM

## 2024-08-22 DIAGNOSIS — E559 Vitamin D deficiency, unspecified: Secondary | ICD-10-CM | POA: Diagnosis not present

## 2024-08-22 DIAGNOSIS — F5101 Primary insomnia: Secondary | ICD-10-CM | POA: Diagnosis not present

## 2024-08-22 DIAGNOSIS — F3340 Major depressive disorder, recurrent, in remission, unspecified: Secondary | ICD-10-CM

## 2024-08-22 DIAGNOSIS — Z131 Encounter for screening for diabetes mellitus: Secondary | ICD-10-CM

## 2024-08-22 DIAGNOSIS — M546 Pain in thoracic spine: Secondary | ICD-10-CM

## 2024-08-22 DIAGNOSIS — L501 Idiopathic urticaria: Secondary | ICD-10-CM | POA: Diagnosis not present

## 2024-08-22 DIAGNOSIS — G8929 Other chronic pain: Secondary | ICD-10-CM | POA: Diagnosis not present

## 2024-08-22 DIAGNOSIS — F3170 Bipolar disorder, currently in remission, most recent episode unspecified: Secondary | ICD-10-CM

## 2024-08-22 DIAGNOSIS — Z0000 Encounter for general adult medical examination without abnormal findings: Secondary | ICD-10-CM

## 2024-08-22 DIAGNOSIS — E669 Obesity, unspecified: Secondary | ICD-10-CM | POA: Insufficient documentation

## 2024-08-22 LAB — CBC
HCT: 43.9 % (ref 36.0–46.0)
Hemoglobin: 14.2 g/dL (ref 12.0–15.0)
MCHC: 32.3 g/dL (ref 30.0–36.0)
MCV: 87 fl (ref 78.0–100.0)
Platelets: 380 K/uL (ref 150.0–400.0)
RBC: 5.04 Mil/uL (ref 3.87–5.11)
RDW: 13.7 % (ref 11.5–15.5)
WBC: 7.4 K/uL (ref 4.0–10.5)

## 2024-08-22 LAB — COMPREHENSIVE METABOLIC PANEL WITH GFR
ALT: 29 U/L (ref 0–35)
AST: 21 U/L (ref 0–37)
Albumin: 4.4 g/dL (ref 3.5–5.2)
Alkaline Phosphatase: 53 U/L (ref 39–117)
BUN: 12 mg/dL (ref 6–23)
CO2: 31 meq/L (ref 19–32)
Calcium: 9.6 mg/dL (ref 8.4–10.5)
Chloride: 104 meq/L (ref 96–112)
Creatinine, Ser: 0.81 mg/dL (ref 0.40–1.20)
GFR: 90.32 mL/min (ref >60.00)
Glucose, Bld: 56 mg/dL — ABNORMAL LOW (ref 70–99)
Potassium: 4.1 meq/L (ref 3.5–5.1)
Sodium: 143 meq/L (ref 135–145)
Total Bilirubin: 0.6 mg/dL (ref 0.2–1.2)
Total Protein: 6.6 g/dL (ref 6.0–8.3)

## 2024-08-22 LAB — LIPID PANEL
Cholesterol: 165 mg/dL (ref 0–200)
HDL: 56.2 mg/dL (ref >39.00)
LDL Cholesterol: 84 mg/dL (ref 0–99)
NonHDL: 109.16
Total CHOL/HDL Ratio: 3
Triglycerides: 128 mg/dL (ref 0.0–149.0)
VLDL: 25.6 mg/dL (ref 0.0–40.0)

## 2024-08-22 LAB — TSH: TSH: 3.5 u[IU]/mL (ref 0.35–5.50)

## 2024-08-22 LAB — VITAMIN D 25 HYDROXY (VIT D DEFICIENCY, FRACTURES): VITD: 51.58 ng/mL (ref 30.00–100.00)

## 2024-08-22 LAB — HEMOGLOBIN A1C: Hgb A1c MFr Bld: 5.4 % (ref 4.6–6.5)

## 2024-08-22 MED ORDER — VITAMIN D (ERGOCALCIFEROL) 1.25 MG (50000 UNIT) PO CAPS: 50000.0000 [IU] | ORAL_CAPSULE | ORAL | 3 refills | Status: AC

## 2024-08-22 MED ORDER — BUSPIRONE HCL 10 MG PO TABS: 10.0000 mg | ORAL_TABLET | Freq: Two times a day (BID) | ORAL | 5 refills | Status: AC

## 2024-08-22 MED ORDER — DOXEPIN HCL 50 MG PO CAPS: 50.0000 mg | ORAL_CAPSULE | Freq: Every day | ORAL | 1 refills | Status: AC

## 2024-08-22 MED ORDER — PREGABALIN 75 MG PO CAPS: 75.0000 mg | ORAL_CAPSULE | Freq: Three times a day (TID) | ORAL | 1 refills | Status: AC

## 2024-08-22 NOTE — Patient Instructions (Addendum)

## 2024-08-22 NOTE — Progress Notes (Signed)
 Patient ID: Alison Vega, female  DOB: 20-Jun-1981, 43 y.o.   MRN: [id number] Patient Care Team    Relationship Specialty Notifications Start End  Catherine Charlies LABOR, DO PCP - General Family Medicine  10/01/21   Luke Orlan HERO, DO Consulting Physician Allergy   11/01/21   Lorence Ozell CROME, MD (Inactive) Consulting Physician Obstetrics and Gynecology  08/07/23     Chief Complaint  Patient presents with   Annual Exam    [name].  Chronic condition management Influenza vaccine-  declined    Subjective: Alison Vega is a 43 y.o.  Female  [name]. All past medical history, surgical history, allergies, family history, immunizations, medications and social history were updated in the electronic medical record today. All recent labs, ED visits and hospitalizations within the last year were reviewed.  Health maintenance:  Colon cancer screening:no fhx. Routine screen at 47 Mammogram: no fhx.  Completed 09/16/2023- [facility] Cervical cancer screening: last pap: 01/2022, results: WNL/Neg hpv, completed by: Dr. 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  She has been referred to pain clinic for further evaluation and treatment> est [facility] She reports compliance with diclofenac  twice daily, Zanaflex  4 mg tTID and tramadol prescribed by pain clinic. She report compliance Lyrica  75 mg twice daily> still having pain and neuropathy symptoms   Chronic idiopathic urticaria/bipolar disorder/primary insomnia: pt reports she is feeling well on medication. Pt reports compliance with doxepin   and lyrica .      08/22/2024    8:40 AM 02/01/2024    8:38 AM 08/17/2023    9:10 AM 02/03/2023    11:36 AM 05/23/2022    1:17 PM  Depression screen PHQ 2/9  Decreased Interest 0 0 1 0 0  Down, Depressed, Hopeless 0 0 2 0 0  PHQ - 2 Score 0 0 3 0 0  Altered sleeping 0 1 0 0   Tired, decreased energy 0 3 0 0   Change in appetite 0 3 1 0   Feeling bad or failure about yourself  0 0 1 0   Trouble concentrating 0 0 3 0   Moving slowly or fidgety/restless 0 0 3 0   Suicidal thoughts 0 0 0 0   PHQ-9 Score 0 7 11 0   Difficult doing work/chores Not difficult at all Not difficult at all Very difficult        08/22/2024    8:41 AM 02/01/2024    8:39 AM 08/17/2023    9:11 AM 01/24/2022    8:32 AM  GAD 7 : Generalized Anxiety Score  Nervous, Anxious, on Edge 0 1 3 3   Control/stop worrying 0 0 3 3  Worry too much - different things 0 1 3 1   Trouble relaxing 0 1 3 1   Restless 0 1 3 3   Easily annoyed or irritable 0 1 3 3   Afraid - awful might happen 0 0 3 1  Total GAD 7 Score 0 5 21 15   Anxiety Difficulty Not difficult at all Not difficult at all      Immunization History  Administered Date(s) Administered   DTP 08/27/1983, 10/29/1983, 06/09/1984, 05/13/1985, 04/07/1988   Hep B, Unspecified 08/03/1995, 08/31/1995, 02/08/1996   Influenza-Unspecified 08/03/2021   MMR 10/13/1984   Moderna Sars-Covid-2  Vaccination 02/15/2020, 03/09/2020, 09/26/2020   OPV 08/27/1983, 10/29/1983, 05/13/1985, 04/07/1988   PNEUMOCOCCAL CONJUGATE-20 05/30/2021   [place], IM 11/19/2022   Tdap 05/30/2021     Past Medical History:  Diagnosis Date   Asthma    Cellulitis    cellulitis/abcess x15, every 2 months   Chronic rhinitis 09/13/2019   History of abnormal cervical Pap smear 12/17/2021   Urticaria    Allergies  Allergen Reactions   Penicillins Shortness Of Breath and Other (See Comments)    Shut patients throat.   Sulfa Antibiotics Shortness Of Breath and Other (See Comments)    Shuts patient's throat.   Avocado Hives   Past Surgical History:  Procedure Laterality Date   CESAREAN SECTION      EYE SURGERY     x3   OPEN REDUCTION INTERNAL FIXATION (ORIF) METACARPAL Right 06/13/2019   Procedure: OPEN TREATMENT OF RIGHT 5TH METACARPAL FRACTURE;  Surgeon: Sebastian Lenis, MD;  Location: [facility];  Service: Orthopedics;  Laterality: Right;   TUBAL LIGATION     Family History  Problem Relation Age of Onset   Diabetes Mother    Asthma Mother    Hepatitis C Mother    Cancer Father        unknown type   Lung cancer Father    Breast cancer Maternal Grandmother    Breast cancer Paternal Grandmother    Asthma Son    Social History   Social History Narrative   Marital status/children/pets: Single. G3P3   Education/employment: HS, employed at [employer].    Safety:      -smoke alarm in the home:Yes     - wears seatbelt: Yes     - Feels safe in their relationships: Yes       Allergies as of 08/22/2024       Reactions   Penicillins Shortness Of Breath, Other (See Comments)   Shut patients throat.   Sulfa Antibiotics Shortness Of Breath, Other (See Comments)   Shuts patient's throat.   Avocado Hives        Medication List        Accurate as of August 22, 2024  8:59 AM. If you have any questions, ask your nurse or doctor.          STOP taking these medications    predniSONE  10 MG tablet Commonly known as: DELTASONE  Stopped by: Charlies Bellini       TAKE these medications    albuterol  108 (90 Base) MCG/ACT inhaler Commonly known as: Ventolin  HFA Inhale 2 puffs into the lungs every 4 (four) hours as needed for wheezing or shortness of breath (coughing fits).   busPIRone  10 MG tablet Commonly known as: BUSPAR  Take 1 tablet (10 mg total) by mouth 2 (two) times daily.   diclofenac  75 MG EC tablet Commonly known as: VOLTAREN  Take 75 mg by mouth 2 (two) times daily.   doxepin  50 MG capsule Commonly known as: SINEQUAN  Take 1 capsule (50 mg total) by mouth at bedtime.   EPINEPHrine  0.3 mg/0.3 mL Soaj injection Commonly  known as: EPI-PEN Inject 0.3 mg into the muscle as needed for anaphylaxis.   famotidine  20 MG tablet Commonly known as: PEPCID  Take 1 tablet (20 mg total) by mouth 2 (two) times daily.   fluticasone  50 MCG/ACT nasal spray Commonly known as: FLONASE  SHAKE LIQUID AND USE 1 TO 2 SPRAYS IN EACH NOSTRIL DAILY AS NEEDED FOR RHINITIS   levocetirizine 5 MG tablet Commonly known as: XYZAL   Take 1 tablet (5 mg total) by mouth 2 (two) times daily as needed (hives).   naloxone 4 MG/0.1ML Liqd nasal spray kit Commonly known as: NARCAN SMARTSIG:Both Nares   pregabalin  75 MG capsule Commonly known as: LYRICA  Take 1 capsule (75 mg total) by mouth 3 (three) times daily. What changed: when to take this Changed by: Charlies Bellini   solifenacin  5 MG tablet Commonly known as: VESIcare  Take 1 tablet (5 mg total) by mouth daily.   Spiriva  Respimat 1.25 MCG/ACT Aers Generic drug: Tiotropium Bromide SMARTSIG:2 Puff(s) Via Inhaler Daily   tiZANidine  4 MG capsule Commonly known as: ZANAFLEX  Take 4 mg by mouth 3 (three) times daily. What changed: Another medication with the same name was removed. Continue taking this medication, and follow the directions you see here. Changed by: Charlies Bellini   traMADol 50 MG tablet Commonly known as: ULTRAM Take 50 mg by mouth every 6 (six) hours as needed.   Trelegy Ellipta  200-62.5-25 MCG/ACT Aepb Generic drug: Fluticasone -Umeclidin-Vilant Inhale 1 puff into the lungs daily. Rinse mouth after each use.   Vitamin D  (Ergocalciferol ) 1.25 MG (50000 UNIT) Caps capsule Commonly known as: DRISDOL  Take 1 capsule (50,000 Units total) by mouth every 7 (seven) days.   Xolair  150 MG/ML prefilled syringe Generic drug: omalizumab  Inject 300 mg into the skin every 28 (twenty-eight) days.        All past medical history, surgical history, allergies, family history, immunizations andmedications were updated in the EMR today and reviewed under the history and  medication portions of their EMR.     No results found for this or any previous visit (from the past 2160 hours).   ROS 14 pt review of systems performed and negative (unless mentioned in an HPI)  Objective: BP 128/82   Pulse 87   Temp 98.4 F (36.9 C)   Ht 5' 2 (1.575 m)   Wt 162 lb 6.4 oz (73.7 kg)   SpO2 96%   BMI 29.70 kg/m  Physical Exam Vitals and nursing note reviewed.  Constitutional:      General: She is not in acute distress.    Appearance: Normal appearance. She is not ill-appearing, toxic-appearing or diaphoretic.  HENT:     Head: Normocephalic and atraumatic.     Right Ear: Tympanic membrane, ear canal and external ear normal. There is no impacted cerumen.     Left Ear: Tympanic membrane, ear canal and external ear normal. There is no impacted cerumen.     Nose: No congestion or rhinorrhea.     Mouth/Throat:     Mouth: Mucous membranes are moist.     Pharynx: Oropharynx is clear. No oropharyngeal exudate or posterior oropharyngeal erythema.  Eyes:     General: No scleral icterus.       Right eye: No discharge.        Left eye: No discharge.     Extraocular Movements: Extraocular movements intact.     Conjunctiva/sclera: Conjunctivae normal.     Pupils: Pupils are equal, round, and reactive to light.  Cardiovascular:     Rate and Rhythm: Normal rate and regular rhythm.     Pulses: Normal pulses.     Heart sounds: Normal heart sounds. No murmur heard.    No friction rub. No gallop.  Pulmonary:     Effort: Pulmonary effort is normal. No respiratory distress.     Breath sounds: Normal breath sounds. No stridor. No wheezing, rhonchi or rales.  Chest:     Chest  wall: No tenderness.  Abdominal:     General: Abdomen is flat. Bowel sounds are normal. There is no distension.     Palpations: Abdomen is soft. There is no mass.     Tenderness: There is no abdominal tenderness. There is no right CVA tenderness, left CVA tenderness, guarding or rebound.     Hernia:  No hernia is present.  Musculoskeletal:        General: No swelling, tenderness or deformity. Normal range of motion.     Cervical back: Normal range of motion and neck supple. No rigidity or tenderness.     Right lower leg: No edema.     Left lower leg: No edema.  Lymphadenopathy:     Cervical: No cervical adenopathy.  Skin:    General: Skin is warm and dry.     Coloration: Skin is not jaundiced or pale.     Findings: No bruising, erythema, lesion or rash.  Neurological:     General: No focal deficit present.     Mental Status: She is alert and oriented to person, place, and time. Mental status is at baseline.     Cranial Nerves: No cranial nerve deficit.     Sensory: No sensory deficit.     Motor: No weakness.     Coordination: Coordination normal.     Gait: Gait normal.     Deep Tendon Reflexes: Reflexes normal.  Psychiatric:        Mood and Affect: Mood normal.        Behavior: Behavior normal.        Thought Content: Thought content normal.        Judgment: Judgment normal.       No results found.  Assessment/plan: Alison Vega is a 43 y.o. 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 process  Established with [facility] which prescribes: Diclofenac  twice daily, Zanaflex  4 mg 3 times daily, tramadol 3 times daily  Increase Lyrica  75 mg to  3 times daily-radiculopathy and anxiety  Chronic idiopathic urticaria/bipolar disorder-depression/Primary insomnia Stable Continue doxepin  50 mg nightly-urticaria Continue Zyrtec -urticaria   Influenza vaccine needed declined Breast cancer screening by mammogram - MM 3D SCREENING MAMMOGRAM BILATERAL BREAST; Future Lipid screening/ Class 1 obesity due to excess calories without serious comorbidity in adult, unspecified BMI - Lipid panel Diabetes mellitus screening/ Class 1 obesity due to excess calories without serious  comorbidity in adult, unspecified BMI - Hemoglobin A1c  Vitamin D  deficiency Continue once weekly high-dose vitamin D  50K prescription - Vitamin D  (25 hydroxy)  Routine general medical examination at a health care facility (Primary) Patient was encouraged to exercise greater than 150 minutes a week. Patient was encouraged to choose a diet filled with fresh fruits and vegetables, and lean meats. AVS provided to patient today for education/recommendation on gender specific health and safety maintenance. Colon cancer screening:no fhx. Routine screen at 47 Mammogram: no fhx.  Completed 09/16/2023- [facility] Cervical cancer screening: last pap: 01/2022, results: WNL/Neg hpv, completed by: Dr. Lorence Immunizations: tdap UTD 05/30/2021, Influenza declined (encouraged yearly),  Infectious disease screening: HIV and  Hep C completed DEXA: routine screen at 62  Return in about 25 weeks (around 02/13/2025).    Orders Placed This Encounter  Procedures   MM 3D SCREENING MAMMOGRAM BILATERAL BREAST   CBC   Comprehensive metabolic panel with GFR   Hemoglobin A1c   Lipid panel   TSH   Vitamin D  (25 hydroxy)  Meds ordered this encounter  Medications   busPIRone  (BUSPAR ) 10 MG tablet    Sig: Take 1 tablet (10 mg total) by mouth 2 (two) times daily.    Dispense:  60 tablet    Refill:  5   doxepin  (SINEQUAN ) 50 MG capsule    Sig: Take 1 capsule (50 mg total) by mouth at bedtime.    Dispense:  90 capsule    Refill:  1   pregabalin  (LYRICA ) 75 MG capsule    Sig: Take 1 capsule (75 mg total) by mouth 3 (three) times daily.    Dispense:  270 capsule    Refill:  1   Vitamin D , Ergocalciferol , (DRISDOL ) 1.25 MG (50000 UNIT) CAPS capsule    Sig: Take 1 capsule (50,000 Units total) by mouth every 7 (seven) days.    Dispense:  12 capsule    Refill:  3   Referral Orders  No referral(s) requested today     Electronically signed by: Charlies Bellini, DO [facility]

## 2024-08-22 NOTE — Progress Notes (Signed)
 Specialty Pharmacy Ongoing Clinical Assessment Note  Alison Vega is a 43 y.o. female who is being followed by the specialty pharmacy service for RxSp Allergy    Patient's specialty medication(s) reviewed today: Omalizumab  (XOLAIR )   Missed doses in the last 4 weeks: 0   Patient/Caregiver did not have any additional questions or concerns.   Therapeutic benefit summary: Patient is achieving benefit   Adverse events/side effects summary: No adverse events/side effects   Patient's therapy is appropriate to: Continue    Goals Addressed             This Visit's Progress    Minimize recurrence of flares   On track    Patient is on track. Patient will maintain adherence. Patient reports that she is currently well-controlled on therapy.          Follow up: 12 months  Silvano LOISE Dolly Specialty Pharmacist

## 2024-08-23 ENCOUNTER — Ambulatory Visit: Payer: Self-pay | Admitting: Family Medicine

## 2024-08-26 DIAGNOSIS — M47812 Spondylosis without myelopathy or radiculopathy, cervical region: Secondary | ICD-10-CM | POA: Diagnosis not present

## 2024-08-26 DIAGNOSIS — G629 Polyneuropathy, unspecified: Secondary | ICD-10-CM | POA: Diagnosis not present

## 2024-08-26 DIAGNOSIS — E611 Iron deficiency: Secondary | ICD-10-CM | POA: Diagnosis not present

## 2024-08-26 DIAGNOSIS — M4186 Other forms of scoliosis, lumbar region: Secondary | ICD-10-CM | POA: Diagnosis not present

## 2024-08-26 DIAGNOSIS — M62838 Other muscle spasm: Secondary | ICD-10-CM | POA: Diagnosis not present

## 2024-08-26 DIAGNOSIS — F319 Bipolar disorder, unspecified: Secondary | ICD-10-CM | POA: Diagnosis not present

## 2024-08-26 DIAGNOSIS — Z3200 Encounter for pregnancy test, result unknown: Secondary | ICD-10-CM | POA: Diagnosis not present

## 2024-08-26 DIAGNOSIS — Z79899 Other long term (current) drug therapy: Secondary | ICD-10-CM | POA: Diagnosis not present

## 2024-08-26 DIAGNOSIS — M21611 Bunion of right foot: Secondary | ICD-10-CM | POA: Diagnosis not present

## 2024-08-31 DIAGNOSIS — Z79899 Other long term (current) drug therapy: Secondary | ICD-10-CM | POA: Diagnosis not present

## 2024-09-07 ENCOUNTER — Other Ambulatory Visit: Payer: Self-pay

## 2024-09-09 ENCOUNTER — Other Ambulatory Visit (HOSPITAL_COMMUNITY): Payer: Self-pay

## 2024-09-09 ENCOUNTER — Ambulatory Visit: Admitting: Podiatry

## 2024-09-13 ENCOUNTER — Other Ambulatory Visit: Payer: Self-pay

## 2024-09-16 ENCOUNTER — Ambulatory Visit

## 2024-09-28 DIAGNOSIS — M47812 Spondylosis without myelopathy or radiculopathy, cervical region: Secondary | ICD-10-CM | POA: Diagnosis not present

## 2024-09-28 DIAGNOSIS — D539 Nutritional anemia, unspecified: Secondary | ICD-10-CM | POA: Diagnosis not present

## 2024-09-28 DIAGNOSIS — Z1159 Encounter for screening for other viral diseases: Secondary | ICD-10-CM | POA: Diagnosis not present

## 2024-09-28 DIAGNOSIS — E7800 Pure hypercholesterolemia, unspecified: Secondary | ICD-10-CM | POA: Diagnosis not present

## 2024-09-28 DIAGNOSIS — M4186 Other forms of scoliosis, lumbar region: Secondary | ICD-10-CM | POA: Diagnosis not present

## 2024-09-28 DIAGNOSIS — R5383 Other fatigue: Secondary | ICD-10-CM | POA: Diagnosis not present

## 2024-09-28 DIAGNOSIS — E559 Vitamin D deficiency, unspecified: Secondary | ICD-10-CM | POA: Diagnosis not present

## 2024-09-28 DIAGNOSIS — Z131 Encounter for screening for diabetes mellitus: Secondary | ICD-10-CM | POA: Diagnosis not present

## 2024-09-28 DIAGNOSIS — Z3200 Encounter for pregnancy test, result unknown: Secondary | ICD-10-CM | POA: Diagnosis not present

## 2024-09-28 DIAGNOSIS — M129 Arthropathy, unspecified: Secondary | ICD-10-CM | POA: Diagnosis not present

## 2024-09-28 DIAGNOSIS — M62838 Other muscle spasm: Secondary | ICD-10-CM | POA: Diagnosis not present

## 2024-09-28 DIAGNOSIS — Z79899 Other long term (current) drug therapy: Secondary | ICD-10-CM | POA: Diagnosis not present

## 2024-10-05 DIAGNOSIS — Z79899 Other long term (current) drug therapy: Secondary | ICD-10-CM | POA: Diagnosis not present

## 2024-10-12 ENCOUNTER — Ambulatory Visit

## 2024-10-24 DIAGNOSIS — M542 Cervicalgia: Secondary | ICD-10-CM | POA: Diagnosis not present

## 2024-11-02 ENCOUNTER — Ambulatory Visit
Admission: RE | Admit: 2024-11-02 | Discharge: 2024-11-02 | Disposition: A | Discharge status: Home / Self Care | Source: Ambulatory Visit | Attending: Family Medicine | Admitting: Family Medicine

## 2024-11-02 DIAGNOSIS — Z1231 Encounter for screening mammogram for malignant neoplasm of breast: Secondary | ICD-10-CM

## 2024-11-04 ENCOUNTER — Encounter: Payer: Self-pay | Admitting: Podiatry

## 2024-11-04 ENCOUNTER — Ambulatory Visit: Admitting: Podiatry

## 2024-11-04 DIAGNOSIS — M7751 Other enthesopathy of right foot: Secondary | ICD-10-CM

## 2024-11-04 MED ORDER — DEXAMETHASONE SODIUM PHOSPHATE 120 MG/30ML IJ SOLN
2.0000 mg | Freq: Once | INTRAMUSCULAR | Status: AC
  Administered 2024-11-04: 2 mg

## 2024-11-04 NOTE — Progress Notes (Signed)
 Patient present still complaining of pain to the bunion site over the dorsal medial aspect of first metatarsal phalangeal joint of the right foot.  With a change in shoes and padding it remained painful.  Has gotten very painful in the past week or 2.   Physical exam:  General appearance: Pleasant, and in no acute distress. AOx3.  Vascular: Pedal pulses: DP 2/4 bilaterally, PT 2/4 bilaterally.  Minimal edema lower legs bilaterally. Capillary fill time immediate bilaterally.  Neurological: Light touch intact feet bilaterally.  Normal Achilles reflex bilaterally.  No clonus or spasticity noted.   Dermatologic:   Skin normal temperature bilaterally.  Skin normal color, tone, and texture bilaterally.   Musculoskeletal: Tenderness over the dorsal medial aspect of the first metatarsal phalangeal joint right.  Hallux abductovalgus right moderate deformity.  No tenderness with range of motion of the first metatarsal phalangeal joint.  No limitation in range of motion first MTP   Diagnosis: 1.  Bursitis right foot. 2.  Painful hallux valgus right foot.  Plan: -Discussed with her the hallux valgus deformity right and continued pain.  Given her continued pain would recommend surgical correction.  Will do an injection today to decrease the inflammation and pain in the area until she can get surgery scheduled. -injected 1.0cc mixture of 0.5cc 0.5% Marcaine  and  0.5cc Dexamethasone  sodium phosphate  USP at inflamed bursa dorsal medial aspect first metatarsal phalangeal joint right.   Return next available surgical consult with one of our surgeons.  Correction of hallux valgus deformity right

## 2024-11-14 ENCOUNTER — Ambulatory Visit: Admitting: Podiatry

## 2024-11-28 ENCOUNTER — Other Ambulatory Visit: Payer: Self-pay

## 2024-12-01 ENCOUNTER — Ambulatory Visit (INDEPENDENT_AMBULATORY_CARE_PROVIDER_SITE_OTHER)

## 2024-12-01 DIAGNOSIS — M25571 Pain in right ankle and joints of right foot: Secondary | ICD-10-CM

## 2024-12-01 DIAGNOSIS — M21619 Bunion of unspecified foot: Secondary | ICD-10-CM

## 2024-12-01 DIAGNOSIS — M79671 Pain in right foot: Secondary | ICD-10-CM | POA: Diagnosis not present

## 2024-12-01 DIAGNOSIS — M25371 Other instability, right ankle: Secondary | ICD-10-CM
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
## 2024-12-09 ENCOUNTER — Ambulatory Visit

## 2024-12-09 ENCOUNTER — Other Ambulatory Visit: Payer: Self-pay

## 2024-12-09 DIAGNOSIS — M6281 Muscle weakness (generalized): Secondary | ICD-10-CM

## 2024-12-09 DIAGNOSIS — R2689 Other abnormalities of gait and mobility: Secondary | ICD-10-CM

## 2024-12-09 DIAGNOSIS — M25571 Pain in right ankle and joints of right foot: Secondary | ICD-10-CM

## 2024-12-16 ENCOUNTER — Ambulatory Visit

## 2024-12-20 ENCOUNTER — Ambulatory Visit

## 2024-12-22 ENCOUNTER — Ambulatory Visit

## 2024-12-27 ENCOUNTER — Ambulatory Visit

## 2024-12-29 ENCOUNTER — Ambulatory Visit

## 2025-01-03 ENCOUNTER — Ambulatory Visit

## 2025-01-05 ENCOUNTER — Ambulatory Visit

## 2025-01-06 ENCOUNTER — Ambulatory Visit

## 2025-01-09 ENCOUNTER — Other Ambulatory Visit

## 2025-02-13 ENCOUNTER — Ambulatory Visit: Admitting: Family Medicine
